# Patient Record
Sex: Male | Born: 1935 | Race: White | Hispanic: No | Marital: Married | Smoking: Former smoker
Health system: Southern US, Community
[De-identification: ages and names within clinical notes are randomized; demographics above are authoritative.]

## PROBLEM LIST (undated history)

## (undated) DIAGNOSIS — I2510 Atherosclerotic heart disease of native coronary artery without angina pectoris: Secondary | ICD-10-CM

## (undated) DIAGNOSIS — N200 Calculus of kidney: Secondary | ICD-10-CM

## (undated) DIAGNOSIS — K219 Gastro-esophageal reflux disease without esophagitis: Secondary | ICD-10-CM

## (undated) DIAGNOSIS — G20 Parkinson's disease: Secondary | ICD-10-CM

## (undated) DIAGNOSIS — I10 Essential (primary) hypertension: Secondary | ICD-10-CM

## (undated) DIAGNOSIS — R7309 Other abnormal glucose: Secondary | ICD-10-CM

## (undated) DIAGNOSIS — E785 Hyperlipidemia, unspecified: Secondary | ICD-10-CM

## (undated) DIAGNOSIS — K635 Polyp of colon: Secondary | ICD-10-CM

## (undated) DIAGNOSIS — C4491 Basal cell carcinoma of skin, unspecified: Secondary | ICD-10-CM

## (undated) DIAGNOSIS — C679 Malignant neoplasm of bladder, unspecified: Secondary | ICD-10-CM

## (undated) HISTORY — PX: BLADDER TUMOR EXCISION: SHX238

## (undated) HISTORY — DX: Malignant neoplasm of bladder, unspecified: C67.9

## (undated) HISTORY — DX: Polyp of colon: K63.5

## (undated) HISTORY — DX: Calculus of kidney: N20.0

## (undated) HISTORY — DX: Atherosclerotic heart disease of native coronary artery without angina pectoris: I25.10

## (undated) HISTORY — DX: Basal cell carcinoma of skin, unspecified: C44.91

## (undated) HISTORY — DX: Hyperlipidemia, unspecified: E78.5

## (undated) HISTORY — DX: Other abnormal glucose: R73.09

## (undated) HISTORY — DX: Gastro-esophageal reflux disease without esophagitis: K21.9

---
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
## 1998-11-08 ENCOUNTER — Observation Stay (HOSPITAL_COMMUNITY): Admission: RE | Admit: 1998-11-08 | Discharge: 1998-11-09 | Payer: Self-pay | Admitting: Internal Medicine

## 1998-11-08 ENCOUNTER — Encounter: Payer: Self-pay | Admitting: Internal Medicine

## 1998-11-09 ENCOUNTER — Encounter: Payer: Self-pay | Admitting: Internal Medicine

## 2000-11-17 HISTORY — PX: LITHOTRIPSY: SUR834

## 2001-11-17 HISTORY — PX: CATARACT EXTRACTION: SUR2

## 2002-08-14 ENCOUNTER — Emergency Department (HOSPITAL_COMMUNITY): Admission: EM | Admit: 2002-08-14 | Discharge: 2002-08-14 | Payer: Self-pay | Admitting: Emergency Medicine

## 2002-08-14 ENCOUNTER — Encounter: Payer: Self-pay | Admitting: Emergency Medicine

## 2002-08-18 ENCOUNTER — Encounter: Payer: Self-pay | Admitting: Urology

## 2002-08-18 ENCOUNTER — Ambulatory Visit (HOSPITAL_BASED_OUTPATIENT_CLINIC_OR_DEPARTMENT_OTHER): Admission: RE | Admit: 2002-08-18 | Discharge: 2002-08-18 | Payer: Self-pay | Admitting: Urology

## 2003-11-18 HISTORY — PX: UPPER GASTROINTESTINAL ENDOSCOPY: SHX188

## 2003-11-18 HISTORY — PX: OTHER SURGICAL HISTORY: SHX169

## 2004-09-03 ENCOUNTER — Inpatient Hospital Stay (HOSPITAL_COMMUNITY): Admission: AD | Admit: 2004-09-03 | Discharge: 2004-09-05 | Payer: Self-pay | Admitting: Internal Medicine

## 2004-10-01 ENCOUNTER — Ambulatory Visit: Payer: Self-pay | Admitting: Internal Medicine

## 2004-10-04 ENCOUNTER — Ambulatory Visit: Payer: Self-pay | Admitting: Cardiology

## 2004-10-06 ENCOUNTER — Emergency Department (HOSPITAL_COMMUNITY): Admission: EM | Admit: 2004-10-06 | Discharge: 2004-10-06 | Payer: Self-pay | Admitting: Emergency Medicine

## 2004-10-08 ENCOUNTER — Ambulatory Visit: Payer: Self-pay | Admitting: Internal Medicine

## 2004-10-18 ENCOUNTER — Ambulatory Visit: Payer: Self-pay | Admitting: Cardiology

## 2004-10-30 ENCOUNTER — Ambulatory Visit: Payer: Self-pay

## 2004-10-30 ENCOUNTER — Ambulatory Visit: Payer: Self-pay | Admitting: Cardiology

## 2004-12-16 ENCOUNTER — Ambulatory Visit: Payer: Self-pay | Admitting: Internal Medicine

## 2005-01-20 ENCOUNTER — Ambulatory Visit: Payer: Self-pay

## 2005-01-30 ENCOUNTER — Ambulatory Visit: Payer: Self-pay | Admitting: Cardiology

## 2005-03-10 ENCOUNTER — Ambulatory Visit: Payer: Self-pay | Admitting: Cardiology

## 2005-05-14 ENCOUNTER — Ambulatory Visit: Payer: Self-pay | Admitting: Internal Medicine

## 2005-05-15 ENCOUNTER — Ambulatory Visit: Payer: Self-pay | Admitting: Internal Medicine

## 2005-05-28 ENCOUNTER — Ambulatory Visit: Payer: Self-pay | Admitting: Cardiology

## 2005-06-02 ENCOUNTER — Ambulatory Visit: Payer: Self-pay | Admitting: Internal Medicine

## 2005-07-15 ENCOUNTER — Ambulatory Visit: Payer: Self-pay | Admitting: Internal Medicine

## 2005-08-19 ENCOUNTER — Ambulatory Visit: Payer: Self-pay | Admitting: Internal Medicine

## 2005-09-18 ENCOUNTER — Ambulatory Visit: Payer: Self-pay | Admitting: Cardiology

## 2005-09-25 ENCOUNTER — Ambulatory Visit: Payer: Self-pay | Admitting: Internal Medicine

## 2005-11-20 ENCOUNTER — Ambulatory Visit: Payer: Self-pay | Admitting: Internal Medicine

## 2006-09-01 ENCOUNTER — Ambulatory Visit: Payer: Self-pay | Admitting: Cardiology

## 2006-09-02 ENCOUNTER — Ambulatory Visit: Payer: Self-pay | Admitting: Internal Medicine

## 2006-09-08 ENCOUNTER — Ambulatory Visit: Payer: Self-pay

## 2006-10-21 ENCOUNTER — Ambulatory Visit: Payer: Self-pay | Admitting: Internal Medicine

## 2006-10-21 LAB — CONVERTED CEMR LAB
AST: 31 units/L (ref 0–37)
BUN: 15 mg/dL (ref 6–23)
Basophils Absolute: 0 10*3/uL (ref 0.0–0.1)
Basophils Relative: 0.7 % (ref 0.0–1.0)
Chol/HDL Ratio, serum: 2.3
Creatinine, Ser: 1.1 mg/dL (ref 0.4–1.5)
Eosinophil percent: 3.3 % (ref 0.0–5.0)
HDL: 73.7 mg/dL (ref >39.0)
Lymphocytes Relative: 27.1 % (ref 12.0–46.0)
Monocytes Relative: 10.7 % (ref 3.0–11.0)
Neutro Abs: 2.7 10*3/uL (ref 1.4–7.7)
Platelets: 190 10*3/uL (ref 150–400)
Potassium: 4 meq/L (ref 3.5–5.1)
RBC: 4.31 M/uL (ref 4.22–5.81)
Triglyceride fasting, serum: 39 mg/dL (ref 0–149)
VLDL: 8 mg/dL (ref 0–40)
WBC: 4.6 10*3/uL (ref 4.5–10.5)

## 2006-11-03 ENCOUNTER — Ambulatory Visit: Payer: Self-pay | Admitting: Internal Medicine

## 2007-01-22 ENCOUNTER — Ambulatory Visit: Payer: Self-pay | Admitting: Internal Medicine

## 2007-03-17 ENCOUNTER — Ambulatory Visit: Payer: Self-pay | Admitting: Internal Medicine

## 2007-06-30 ENCOUNTER — Telehealth (INDEPENDENT_AMBULATORY_CARE_PROVIDER_SITE_OTHER): Payer: Self-pay | Admitting: *Deleted

## 2007-07-22 ENCOUNTER — Ambulatory Visit: Payer: Self-pay | Admitting: Internal Medicine

## 2007-07-22 DIAGNOSIS — I2510 Atherosclerotic heart disease of native coronary artery without angina pectoris: Secondary | ICD-10-CM | POA: Insufficient documentation

## 2007-07-22 DIAGNOSIS — Z87442 Personal history of urinary calculi: Secondary | ICD-10-CM | POA: Insufficient documentation

## 2007-07-22 DIAGNOSIS — K219 Gastro-esophageal reflux disease without esophagitis: Secondary | ICD-10-CM | POA: Insufficient documentation

## 2007-07-22 DIAGNOSIS — M2550 Pain in unspecified joint: Secondary | ICD-10-CM | POA: Insufficient documentation

## 2007-07-23 ENCOUNTER — Encounter (INDEPENDENT_AMBULATORY_CARE_PROVIDER_SITE_OTHER): Payer: Self-pay | Admitting: *Deleted

## 2007-07-23 LAB — CONVERTED CEMR LAB: Rhuematoid fact SerPl-aCnc: <20 intl units/mL — ABNORMAL LOW (ref 0.0–20.0)

## 2007-09-13 ENCOUNTER — Telehealth (INDEPENDENT_AMBULATORY_CARE_PROVIDER_SITE_OTHER): Payer: Self-pay | Admitting: *Deleted

## 2007-09-14 ENCOUNTER — Ambulatory Visit: Payer: Self-pay | Admitting: Internal Medicine

## 2007-09-22 ENCOUNTER — Ambulatory Visit: Payer: Self-pay | Admitting: Cardiology

## 2007-09-27 ENCOUNTER — Telehealth (INDEPENDENT_AMBULATORY_CARE_PROVIDER_SITE_OTHER): Payer: Self-pay | Admitting: *Deleted

## 2007-09-28 ENCOUNTER — Ambulatory Visit: Payer: Self-pay | Admitting: Internal Medicine

## 2007-09-28 DIAGNOSIS — J45909 Unspecified asthma, uncomplicated: Secondary | ICD-10-CM | POA: Insufficient documentation

## 2007-11-01 ENCOUNTER — Encounter (INDEPENDENT_AMBULATORY_CARE_PROVIDER_SITE_OTHER): Payer: Self-pay | Admitting: *Deleted

## 2008-01-28 ENCOUNTER — Encounter (INDEPENDENT_AMBULATORY_CARE_PROVIDER_SITE_OTHER): Payer: Self-pay | Admitting: *Deleted

## 2008-01-31 ENCOUNTER — Ambulatory Visit: Payer: Self-pay | Admitting: Internal Medicine

## 2008-01-31 DIAGNOSIS — N400 Enlarged prostate without lower urinary tract symptoms: Secondary | ICD-10-CM | POA: Insufficient documentation

## 2008-01-31 DIAGNOSIS — E785 Hyperlipidemia, unspecified: Secondary | ICD-10-CM | POA: Insufficient documentation

## 2008-01-31 DIAGNOSIS — N529 Male erectile dysfunction, unspecified: Secondary | ICD-10-CM | POA: Insufficient documentation

## 2008-02-07 ENCOUNTER — Encounter (INDEPENDENT_AMBULATORY_CARE_PROVIDER_SITE_OTHER): Payer: Self-pay | Admitting: *Deleted

## 2008-02-22 ENCOUNTER — Telehealth (INDEPENDENT_AMBULATORY_CARE_PROVIDER_SITE_OTHER): Payer: Self-pay | Admitting: *Deleted

## 2008-03-28 ENCOUNTER — Ambulatory Visit: Payer: Self-pay | Admitting: Internal Medicine

## 2008-03-28 ENCOUNTER — Encounter (INDEPENDENT_AMBULATORY_CARE_PROVIDER_SITE_OTHER): Payer: Self-pay | Admitting: *Deleted

## 2008-03-28 DIAGNOSIS — R972 Elevated prostate specific antigen [PSA]: Secondary | ICD-10-CM | POA: Insufficient documentation

## 2008-03-28 LAB — CONVERTED CEMR LAB
Glucose, Urine, Semiquant: NEGATIVE
Ketones, urine, test strip: NEGATIVE
Nitrite: NEGATIVE
Specific Gravity, Urine: 1.01
pH: 7

## 2008-03-31 ENCOUNTER — Encounter: Payer: Self-pay | Admitting: Internal Medicine

## 2008-05-03 ENCOUNTER — Encounter: Payer: Self-pay | Admitting: Internal Medicine

## 2008-05-31 ENCOUNTER — Encounter: Payer: Self-pay | Admitting: Internal Medicine

## 2008-08-23 ENCOUNTER — Ambulatory Visit: Payer: Self-pay | Admitting: Internal Medicine

## 2008-08-23 ENCOUNTER — Telehealth (INDEPENDENT_AMBULATORY_CARE_PROVIDER_SITE_OTHER): Payer: Self-pay | Admitting: *Deleted

## 2008-09-21 ENCOUNTER — Ambulatory Visit: Payer: Self-pay | Admitting: Internal Medicine

## 2008-10-02 ENCOUNTER — Ambulatory Visit: Payer: Self-pay | Admitting: Cardiology

## 2008-10-10 ENCOUNTER — Ambulatory Visit: Payer: Self-pay | Admitting: Cardiology

## 2009-01-09 ENCOUNTER — Encounter: Payer: Self-pay | Admitting: Internal Medicine

## 2009-01-09 ENCOUNTER — Ambulatory Visit: Payer: Self-pay | Admitting: Internal Medicine

## 2009-02-08 ENCOUNTER — Encounter (INDEPENDENT_AMBULATORY_CARE_PROVIDER_SITE_OTHER): Payer: Self-pay | Admitting: *Deleted

## 2009-02-28 ENCOUNTER — Ambulatory Visit: Payer: Self-pay | Admitting: Internal Medicine

## 2009-02-28 DIAGNOSIS — Z85828 Personal history of other malignant neoplasm of skin: Secondary | ICD-10-CM | POA: Insufficient documentation

## 2009-02-28 DIAGNOSIS — Z860100 Personal history of colon polyps, unspecified: Secondary | ICD-10-CM | POA: Insufficient documentation

## 2009-04-05 ENCOUNTER — Ambulatory Visit: Payer: Self-pay | Admitting: Internal Medicine

## 2009-04-05 LAB — CONVERTED CEMR LAB
AST: 21 units/L (ref 0–37)
BUN: 14 mg/dL (ref 6–23)
Cholesterol: 160 mg/dL (ref 0–200)
Creatinine, Ser: 1 mg/dL (ref 0.4–1.5)
Eosinophils Absolute: 0.1 10*3/uL (ref 0.0–0.7)
GFR calc non Af Amer: 76.76 mL/min (ref >60)
LDL Cholesterol: 80 mg/dL (ref 0–99)
MCHC: 35 g/dL (ref 30.0–36.0)
MCV: 97.1 fL (ref 78.0–100.0)
Monocytes Absolute: 0.4 10*3/uL (ref 0.1–1.0)
Neutrophils Relative %: 50.5 % (ref 43.0–77.0)
Platelets: 155 10*3/uL (ref 150.0–400.0)
Potassium: 4 meq/L (ref 3.5–5.1)
Total Bilirubin: 0.8 mg/dL (ref 0.3–1.2)
Triglycerides: 46 mg/dL (ref 0.0–149.0)
VLDL: 9.2 mg/dL (ref 0.0–40.0)

## 2009-04-10 ENCOUNTER — Ambulatory Visit: Payer: Self-pay | Admitting: Internal Medicine

## 2009-04-10 ENCOUNTER — Telehealth (INDEPENDENT_AMBULATORY_CARE_PROVIDER_SITE_OTHER): Payer: Self-pay | Admitting: *Deleted

## 2009-04-10 DIAGNOSIS — R946 Abnormal results of thyroid function studies: Secondary | ICD-10-CM | POA: Insufficient documentation

## 2009-04-13 ENCOUNTER — Telehealth (INDEPENDENT_AMBULATORY_CARE_PROVIDER_SITE_OTHER): Payer: Self-pay | Admitting: *Deleted

## 2009-04-27 ENCOUNTER — Telehealth (INDEPENDENT_AMBULATORY_CARE_PROVIDER_SITE_OTHER): Payer: Self-pay | Admitting: *Deleted

## 2009-05-07 ENCOUNTER — Emergency Department (HOSPITAL_COMMUNITY): Admission: EM | Admit: 2009-05-07 | Discharge: 2009-05-07 | Payer: Self-pay | Admitting: Emergency Medicine

## 2009-05-08 ENCOUNTER — Ambulatory Visit: Payer: Self-pay | Admitting: Internal Medicine

## 2009-05-29 ENCOUNTER — Ambulatory Visit: Payer: Self-pay | Admitting: Internal Medicine

## 2009-06-02 LAB — CONVERTED CEMR LAB
Free T4: 0.8 ng/dL (ref 0.6–1.6)
TSH: 3.74 microintl units/mL (ref 0.35–5.50)

## 2009-06-04 ENCOUNTER — Encounter (INDEPENDENT_AMBULATORY_CARE_PROVIDER_SITE_OTHER): Payer: Self-pay | Admitting: *Deleted

## 2009-06-06 ENCOUNTER — Ambulatory Visit: Payer: Self-pay | Admitting: Internal Medicine

## 2009-06-06 DIAGNOSIS — R319 Hematuria, unspecified: Secondary | ICD-10-CM | POA: Insufficient documentation

## 2009-06-11 ENCOUNTER — Telehealth (INDEPENDENT_AMBULATORY_CARE_PROVIDER_SITE_OTHER): Payer: Self-pay | Admitting: *Deleted

## 2009-09-18 ENCOUNTER — Encounter: Payer: Self-pay | Admitting: Internal Medicine

## 2009-10-07 DIAGNOSIS — I10 Essential (primary) hypertension: Secondary | ICD-10-CM | POA: Insufficient documentation

## 2009-10-09 ENCOUNTER — Ambulatory Visit: Payer: Self-pay | Admitting: Cardiology

## 2009-10-10 ENCOUNTER — Encounter: Payer: Self-pay | Admitting: Internal Medicine

## 2010-02-12 ENCOUNTER — Ambulatory Visit: Payer: Self-pay | Admitting: Internal Medicine

## 2010-03-07 ENCOUNTER — Ambulatory Visit: Payer: Self-pay | Admitting: Internal Medicine

## 2010-03-07 DIAGNOSIS — R252 Cramp and spasm: Secondary | ICD-10-CM | POA: Insufficient documentation

## 2010-03-13 ENCOUNTER — Ambulatory Visit: Payer: Self-pay | Admitting: Internal Medicine

## 2010-03-13 LAB — CONVERTED CEMR LAB
Ketones, urine, test strip: NEGATIVE
Nitrite: NEGATIVE
Urobilinogen, UA: 0.2

## 2010-03-14 ENCOUNTER — Encounter: Payer: Self-pay | Admitting: Internal Medicine

## 2010-03-14 LAB — CONVERTED CEMR LAB
AST: 23 units/L (ref 0–37)
Albumin: 4 g/dL (ref 3.5–5.2)
BUN: 12 mg/dL (ref 6–23)
Basophils Relative: 0.4 % (ref 0.0–3.0)
Cholesterol: 159 mg/dL (ref 0–200)
Eosinophils Relative: 2.8 % (ref 0.0–5.0)
GFR calc non Af Amer: 68.6 mL/min (ref >60)
Glucose, Bld: 87 mg/dL (ref 70–99)
HCT: 41.5 % (ref 39.0–52.0)
Hemoglobin: 14.2 g/dL (ref 13.0–17.0)
Lymphs Abs: 1.5 10*3/uL (ref 0.7–4.0)
MCV: 97.4 fL (ref 78.0–100.0)
Monocytes Absolute: 0.4 10*3/uL (ref 0.1–1.0)
Neutro Abs: 2.4 10*3/uL (ref 1.4–7.7)
Platelets: 163 10*3/uL (ref 150.0–400.0)
Potassium: 4.7 meq/L (ref 3.5–5.1)
RBC: 4.26 M/uL (ref 4.22–5.81)
Total Protein: 6.8 g/dL (ref 6.0–8.3)
VLDL: 10.8 mg/dL (ref 0.0–40.0)
WBC: 4.5 10*3/uL (ref 4.5–10.5)

## 2010-03-15 ENCOUNTER — Telehealth: Payer: Self-pay | Admitting: Internal Medicine

## 2010-03-21 ENCOUNTER — Encounter: Payer: Self-pay | Admitting: Internal Medicine

## 2010-03-22 ENCOUNTER — Telehealth: Payer: Self-pay | Admitting: Internal Medicine

## 2010-04-17 ENCOUNTER — Encounter: Payer: Self-pay | Admitting: Internal Medicine

## 2010-04-29 ENCOUNTER — Telehealth: Payer: Self-pay | Admitting: Cardiology

## 2010-05-01 ENCOUNTER — Telehealth: Payer: Self-pay | Admitting: Cardiology

## 2010-05-03 ENCOUNTER — Ambulatory Visit: Payer: Self-pay | Admitting: Cardiology

## 2010-05-06 ENCOUNTER — Telehealth (INDEPENDENT_AMBULATORY_CARE_PROVIDER_SITE_OTHER): Payer: Self-pay | Admitting: *Deleted

## 2010-05-07 ENCOUNTER — Ambulatory Visit: Payer: Self-pay

## 2010-05-07 ENCOUNTER — Telehealth: Payer: Self-pay | Admitting: Cardiology

## 2010-05-07 ENCOUNTER — Encounter (HOSPITAL_COMMUNITY): Admission: RE | Admit: 2010-05-07 | Discharge: 2010-07-24 | Payer: Self-pay | Admitting: Cardiology

## 2010-05-07 ENCOUNTER — Ambulatory Visit: Payer: Self-pay | Admitting: Internal Medicine

## 2010-05-07 ENCOUNTER — Encounter: Payer: Self-pay | Admitting: Internal Medicine

## 2010-05-29 ENCOUNTER — Encounter: Payer: Self-pay | Admitting: Internal Medicine

## 2010-08-14 ENCOUNTER — Ambulatory Visit: Payer: Self-pay | Admitting: Internal Medicine

## 2010-08-21 ENCOUNTER — Ambulatory Visit: Payer: Self-pay | Admitting: Internal Medicine

## 2010-08-29 ENCOUNTER — Encounter: Payer: Self-pay | Admitting: Internal Medicine

## 2010-10-04 ENCOUNTER — Ambulatory Visit: Payer: Self-pay | Admitting: Internal Medicine

## 2010-10-04 DIAGNOSIS — IMO0002 Reserved for concepts with insufficient information to code with codable children: Secondary | ICD-10-CM | POA: Insufficient documentation

## 2010-10-07 ENCOUNTER — Telehealth (INDEPENDENT_AMBULATORY_CARE_PROVIDER_SITE_OTHER): Payer: Self-pay | Admitting: *Deleted

## 2010-10-31 ENCOUNTER — Encounter: Payer: Self-pay | Admitting: Cardiology

## 2010-11-01 ENCOUNTER — Encounter: Payer: Self-pay | Admitting: Cardiology

## 2010-11-01 ENCOUNTER — Ambulatory Visit: Payer: Self-pay | Admitting: Cardiology

## 2010-11-17 DIAGNOSIS — R7309 Other abnormal glucose: Secondary | ICD-10-CM

## 2010-11-17 HISTORY — DX: Other abnormal glucose: R73.09

## 2010-12-15 LAB — CONVERTED CEMR LAB
Albumin: 3.9 g/dL (ref 3.5–5.2)
Basophils Absolute: 0 10*3/uL (ref 0.0–0.1)
Cholesterol, target level: 200 mg/dL
Cholesterol: 146 mg/dL (ref 0–200)
Creatinine, Ser: 1 mg/dL (ref 0.4–1.5)
Eosinophils Absolute: 0.2 10*3/uL (ref 0.0–0.6)
GFR calc Af Amer: 93 mL/min
GFR calc non Af Amer: 77 mL/min
HCT: 39.5 % (ref 39.0–52.0)
HDL goal, serum: 40 mg/dL
HDL: 64.9 mg/dL (ref >39.0)
Hemoglobin: 13.2 g/dL (ref 13.0–17.0)
Lymphocytes Relative: 30.9 % (ref 12.0–46.0)
MCHC: 33.3 g/dL (ref 30.0–36.0)
MCV: 95.9 fL (ref 78.0–100.0)
Monocytes Absolute: 0.6 10*3/uL (ref 0.2–0.7)
Monocytes Relative: 12.4 % — ABNORMAL HIGH (ref 3.0–11.0)
Neutro Abs: 2.4 10*3/uL (ref 1.4–7.7)
Neutrophils Relative %: 51.6 % (ref 43.0–77.0)
PSA: 3.67 ng/mL (ref 0.10–4.00)
Potassium: 5.1 meq/L (ref 3.5–5.1)
Sodium: 147 meq/L — ABNORMAL HIGH (ref 135–145)
TSH: 4.38 microintl units/mL (ref 0.35–5.50)
Total Bilirubin: 0.9 mg/dL (ref 0.3–1.2)

## 2010-12-17 NOTE — Assessment & Plan Note (Signed)
Summary: CPX/KDC   Vital Signs:  Patient profile:   77 year old male Height:      67 inches Weight:      152 pounds Temp:     98.1 degrees F oral Pulse rate:   62 / minute Resp:     16 per minute BP sitting:   138 / 62  (left arm)  Vitals Entered By: Jeremy Johann CMA (March 07, 2010 1:09 PM)  CC: cpx, not fasting, General Medical Evaluation Comments REVIEWED MED LIST, PATIENT AGREED DOSE AND INSTRUCTION CORRECT    Primary Care Provider:  Marga Melnick MD  CC:  cpx, not fasting, and General Medical Evaluation.  History of Present Illness: [name] [name] is here for a physical; he is essentially except for intermittent cramps in calf, especially @ night. Preventive measures reviewed; all up to date  including immunizations & POA.  Allergies: 1)  ! * Dilaudid 2)  ! Zocor 3)  ! * Regular Asa  Past History:  Past Medical History: GERD Nephrolithiasis, PMH of X? 5, Dr Annabell Howells Asthma ; 2001 intestinal infection ; PNA 2001 C A D (ICD-414.00) Colonic polyps, PMH  of Skin cancer, PMH of, basal cell X 2, Dr Londell Moh, Derm Hematuria ,recurrent , negative evaluation Dr Annabell Howells 2009 Coronary heart disease, status post overlapping drug-eluting stents     in the proximal left anterior descending and a drug-eluting stent     in distal left anterior descending 2005, now stable.  Good left ventricular function. Hyperlipidemia. Elevated  liver function tests on Zocor.  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 Eyes:  Denies blurring, double vision, and vision loss-both eyes. ENT:  Complains of decreased hearing; denies ringing in ears. CV:  Denies chest pain or discomfort, difficulty breathing at night, difficulty breathing while lying down, leg cramps with exertion, palpitations, shortness of breath with exertion, swelling of feet, and swelling of hands. Resp:  Denies cough, excessive snoring, hypersomnolence, morning headaches, and sputum productive. GI:  Denies abdominal pain, bloody stools, dark tarry stools, and indigestion. GU:  Complains of hematuria; denies discharge and dysuria; Intermittent hematuria. MS:  Complains of joint pain and cramps; denies joint redness, joint swelling, low back pain, mid back pain, muscle aches, and thoracic pain. Derm:  Denies changes in nail beds, dryness, hair loss, and lesion(s). Neuro:  Complains of numbness and tingling; denies disturbances in coordination and poor balance; N&T in C8 distribution  bilaterally & R lateral foot. Psych:  Denies anxiety and depression. Endo:  Denies cold intolerance, excessive hunger, excessive thirst, excessive urination, and heat intolerance. Heme:  Plavix affects clotting. Allergy:  Complains of itching eyes, seasonal allergies, and sneezing; Fexofenidine & fluticasone control symptoms.  Physical Exam  General:  Appears younger than age,well-nourished, alert,appropriate and cooperative throughout examination Head:  Normocephalic and atraumatic without obvious abnormalities. Eyes:  No corneal or conjunctival inflammation noted.Perrla. Funduscopic exam benign, without hemorrhages, exudates or papilledema.  Ears:  External ear exam shows no significant lesions or deformities.  Otoscopic examination reveals clear canals, tympanic membranes are intact  bilaterally without bulging, retraction, inflammation or discharge. Hearing is grossly normal bilaterally. Nose:  External nasal examination shows no deformity or inflammation. Nasal mucosa are pink and moist without lesions or exudates. Mouth:  Oral mucosa and oropharynx without lesions or exudates. Upper plate Neck:  No deformities, masses, or tenderness noted. Chest Wall:  Prominent xiphoid Lungs:  Normal respiratory effort, chest expands symmetrically. Lungs are clear to auscultation, no crackles or wheezes. Heart:  Normal rate and regular rhythm. S1 and S2 normal without gallop, murmur, click, rub.S4 Abdomen:  Bowel sounds positive,abdomen soft and non-tender without masses, organomegaly or hernias noted. Rectal:  No external abnormalities noted. Normal sphincter tone. No rectal masses or tenderness. Genitalia:  Testes bilaterally descended without nodularity, tenderness or masses. No scrotal masses or lesions. No penis lesions or urethral discharge. Prostate:  Prostate gland firm and smooth, ULN enlargement, w/o nodularity, tenderness, mass, asymmetry or induration. Msk:  No deformity or scoliosis noted of thoracic or lumbar spine.   Pulses:  R and L carotid,radial,dorsalis pedis and posterior tibial pulses are full and equal bilaterally Extremities:  No clubbing, cyanosis, or deformity noted with normal full range of motion of all joints.  trace left pedal edema and trace right pedal edema @ sox line.   Neurologic:  alert & oriented X3 and DTRs symmetrical and normal.   Skin:  Intact without suspicious lesions or rashes Cervical Nodes:  No lymphadenopathy noted Axillary Nodes:  No palpable lymphadenopathy Inguinal Nodes:  No significant adenopathy Psych:  memory intact for recent and remote, normally interactive, and good eye contact.     Impression & Recommendations:  Problem # 1:  PREVENTIVE HEALTH CARE (ICD-V70.0)  Problem # 2:  MUSCLE CRAMPS (ICD-729.82)  Problem # 3:   HYPERTENSION, BENIGN (ICD-401.1) controlled  Problem # 4:  HYPERPLASIA PROSTATE UNS W/O UR OBST & OTH LUTS (ICD-600.90) essentially resolved  Problem # 5:  ASTHMA (ICD-493.90) quiescent His updated medication list for this problem includes:    Advair Diskus 100-50 Mcg/dose Misc (Fluticasone-salmeterol) .Marland Kitchen... 1 puff q 12 hrs  Problem # 6:  C A D (ICD-414.00) as per Dr Juanda Chance His updated medication list for this problem includes:    Plavix 75 Mg Tabs (Clopidogrel bisulfate) .Marland Kitchen... 1 by mouth qd    Adult Aspirin Ec Low Strength 81 Mg Tbec (Aspirin) .Marland Kitchen... 1 by mouth qd  Problem # 7:  HEMATURIA UNSPECIFIED (ICD-599.70) Recurrent The following medications were removed from the medication list:    Amoxicillin-pot Clavulanate 875-125 Mg Tabs (Amoxicillin-pot clavulanate) .Marland Kitchen... 1 q 12 hrs with a meal  Complete Medication List: 1)  Crestor 10 Mg Tabs (Rosuvastatin calcium) .... 1/2 tab mon,wed,fri 2)  Flonase 50 Mcg/act Susp (Fluticasone propionate) .Marland Kitchen.. 1 spray two times a [name] as "crossover" technique 3)  Omeprazole 20 Mg Cpdr (Omeprazole) .Marland Kitchen.. 1 by mouth qd 4)  Plavix 75 Mg Tabs (Clopidogrel bisulfate) .Marland Kitchen.. 1 by mouth qd 5)  Adult Aspirin Ec Low Strength 81 Mg Tbec (Aspirin) .Marland Kitchen.. 1 by mouth qd 6)  Advair Diskus 100-50 Mcg/dose Misc (Fluticasone-salmeterol) .Marland Kitchen.. 1 puff q 12 hrs 7)  Viagra 100 Mg Tabs (Sildenafil citrate) .... 1/2-1 once daily prn 8)  Allegra 180 Mg Tabs (Fexofenadine hcl) .Marland Kitchen.. 1 by mouth once daily  Patient Instructions: 1)  Please schedule fasting labs: 2)  BMP ;vitamin D level; 3)  Hepatic Panel; 4)  Lipid Panel ;  5)  TSH ; 6)  CBC w/ Diff; 7)  PSA ; urinalysis with micro. Codes: 729.82, 414.00,599.70,401.1,600.90. Take Mag /Cal (or Cal/Mag) at bedtime as needed  for muscle aches.

## 2010-12-17 NOTE — Assessment & Plan Note (Signed)
Summary: hip pain/fell last week/cbs   Vital Signs:  Patient profile:   77 year old male Height:      67 inches (170.18 cm) Weight:      153.50 pounds (69.77 kg) BMI:     24.13 Temp:     97.9 degrees F (36.61 degrees C) oral Resp:     16 per minute BP sitting:   150 / 70  (left arm) Cuff size:   regular  Vitals Entered By: Lucious Groves CMA (October 04, 2010 12:30 PM) CC: C/O hip and lower back pain x1 week./kb, Lower Extremity Joint pain Is Patient Diabetic? No Pain Assessment Patient in pain? yes     Location: hip/back Intensity: 2 Type: sharp Onset of pain  one week Comments Patient notes that he fell last week going down his steps. He also notes being hit on that same side with a tennis ball. Patient notes that he normally has an ache, but the sharp pain is somewhat new./kb   Primary Care Franci Oshana:  Marga Melnick MD  CC:  C/O hip and lower back pain x1 week./kb and Lower Extremity Joint pain.  History of Present Illness: Lower Extremity Joint Pain      This is a 77 year old man who presents with Lower Extremity Joint pain X 3 weeks.  The patient reports locking, but denies swelling, redness, giving away, popping, stiffness for >1 hr, decreased ROM, and weakness.  The pain is located in the right hip.  The pain began suddenly and with a fall on stairs 3 weeks ago. There was bruising which has resolved. The pain  had been  dull, intermittent, and  relieved by activity  until this am In fact he has been playing golf & tennis w/o limitation.Last night he exercised aggressively @ tennis drills.This am he had very sharp pain with straightening up this am.  The patient denies the following symptoms: fever, rash, photosensitivity, eye symptoms, diarrhea, and dysuria.  Rx:ice. He is on Plavix. PMH of DJD of hips; Glucosamne / Chrondroitin helped .No PMH of Osteopenia.  Current Medications (verified): 1)  Crestor 10 Mg Tabs (Rosuvastatin Calcium) .... 1/2 Tab Mon,wed,fri 2)  Flonase  50 Mcg/act Susp (Fluticasone Propionate) .Marland Kitchen.. 1 Spray  One  Time A Day As "crossover" Technique 3)  Omeprazole 20 Mg  Cpdr (Omeprazole) .Marland Kitchen.. 1 By Mouth Qd 4)  Plavix 75 Mg  Tabs (Clopidogrel Bisulfate) .Marland Kitchen.. 1 By Mouth Qd 5)  Adult Aspirin Ec Low Strength 81 Mg  Tbec (Aspirin) .Marland Kitchen.. 1 By Mouth Qd 6)  Advair Diskus 100-50 Mcg/dose  Misc (Fluticasone-Salmeterol) .Marland Kitchen.. 1 Puff Q 12 Hrs 7)  Viagra 100 Mg  Tabs (Sildenafil Citrate) .... 1/2-1 Once Daily Prn 8)  Allegra 180 Mg  Tabs (Fexofenadine Hcl) .Marland Kitchen.. 1 By Mouth Once Daily 9)  Mvi .... As Directed 10)  Glucosamine/chondroitin .... As Directed 11)  Rapaflo 4 Mg Caps (Silodosin) .Marland Kitchen.. 1 By Mouth At Bedtime  Allergies (verified): 1)  ! * Dilaudid 2)  ! Zocor 3)  ! * Regular Asa  Review of Systems Neuro:  Denies brief paralysis, disturbances in coordination, numbness, poor balance, tingling, and weakness.  Physical Exam  General:  in no acute distress; alert,appropriate and cooperative throughout examination Msk:  He lay down & sat up w/o help Extremities:  No clubbing, cyanosis, edema. Mild OA DIP changes.Neg SLR. Pain @ the R sacral area, NOT the hip , with ROM Neurologic:  alert & oriented X3, strength normal in all extremities,  gait (heel & toe) normal, and DTRs symmetrical and normal.   Skin:  Intact without suspicious lesions or rashes Psych:  memory intact for recent and remote, normally interactive, and good eye contact.     Impression & Recommendations:  Problem # 1:  OTHER INJURY OF OTHER SITES OF TRUNK (ICD-959.19)  R sacroiliac area ; on Plavix  Orders: T-Sacroiliac Joints (43329JJ) T-Hip Comp Right Min 2 views (73510TC) Prescription Created Electronically [id number])  Complete Medication List: 1)  Crestor 10 Mg Tabs (Rosuvastatin calcium) .... 1/2 tab mon,wed,fri 2)  Flonase 50 Mcg/act Susp (Fluticasone propionate) .Marland Kitchen.. 1 spray  one  time a day as "crossover" technique 3)  Omeprazole 20 Mg Cpdr (Omeprazole) .Marland Kitchen.. 1 by mouth  qd 4)  Plavix 75 Mg Tabs (Clopidogrel bisulfate) .Marland Kitchen.. 1 by mouth qd 5)  Adult Aspirin Ec Low Strength 81 Mg Tbec (Aspirin) .Marland Kitchen.. 1 by mouth qd 6)  Advair Diskus 100-50 Mcg/dose Misc (Fluticasone-salmeterol) .Marland Kitchen.. 1 puff q 12 hrs 7)  Viagra 100 Mg Tabs (Sildenafil citrate) .... 1/2-1 once daily prn 8)  Allegra 180 Mg Tabs (Fexofenadine hcl) .Marland Kitchen.. 1 by mouth once daily 9)  Mvi  .... As directed 10)  Glucosamine/chondroitin  .... As directed 11)  Rapaflo 4 Mg Caps (Silodosin) .Marland Kitchen.. 1 by mouth at bedtime 12)  Tramadol Hcl 50 Mg Tabs (Tramadol hcl) .Marland Kitchen.. 1 every 6 hrs as needed pain  Patient Instructions: 1)  Xrays @ [facility]. Prescriptions: TRAMADOL HCL 50 MG TABS (TRAMADOL HCL) 1 every 6 hrs as needed pain  #30 x 0   Entered and Authorized by:   Marga Melnick MD   Signed by:   Marga Melnick MD on 10/04/2010   Method used:   Faxed to ...       [facility] [address] (retail)       [address]       [place], [place]  [address]       Ph: [phone]       Fax: [phone]   RxID:   [id number]    Orders Added: 1)  T-Sacroiliac Joints [72202TC] 2)  T-Hip Comp Right Min 2 views [73510TC] 3)  Est. Patient Level IV [60737] 4)  Prescription Created Electronically [phone]

## 2010-12-17 NOTE — Letter (Signed)
Summary: [facility]  [facility]   Imported By: Lanelle Bal 09/07/2010 10:21:52  _____________________________________________________________________  External Attachment:    Type:   Image     Comment:   External Document

## 2010-12-17 NOTE — Consult Note (Signed)
Summary: [facility]  [facility]   Imported By: Lanelle Bal 04/25/2010 13:01:40  _____________________________________________________________________  External Attachment:    Type:   Image     Comment:   External Document

## 2010-12-17 NOTE — Assessment & Plan Note (Signed)
Summary: Cardiology Nuclear Study  Nuclear Med Background Indications for Stress Test: Evaluation for Ischemia, Surgical Clearance, Stent Patency   History: Asthma, Myocardial Perfusion Study, Stents  History Comments: '05 Stents: x3; LAD '07 MPS: EF= 60%, NL     Nuclear Pre-Procedure Cardiac Risk Factors: History of Smoking, Hypertension, Lipids Caffeine/Decaff Intake: none NPO After: 8:30 PM IV 0.9% NS with Angio Cath: 20g     IV Site: (R) AC IV Started by: Stanton Kidney EMT-P Chest Size (in) 40     Height (in): 67 Weight (lb): 153 BMI: 24.05  Nuclear Med Study 1 or 2 day study:  1 day     Stress Test Type:  Stress Reading MD:  Arvilla Meres, MD     Referring MD:  Charlies Constable Resting Radionuclide:  Technetium 51m Tetrofosmin     Resting Radionuclide Dose:  10 mCi  Stress Radionuclide:  Technetium 66m Tetrofosmin     Stress Radionuclide Dose:  33 mCi   Stress Protocol Exercise Time (min):  9:00 min     Max HR:  133 bpm     Predicted Max HR:  141 bpm  Max Systolic BP: 175 mm Hg     Percent Max HR:  94.33 %     METS: 10.10 Rate Pressure Product:  24401    Stress Test Technologist:  Milana Na EMT-P     Nuclear Technologist:  Domenic Polite CNMT  Rest Procedure  Myocardial perfusion imaging was performed at rest 45 minutes following the intravenous administration of Myoview Technetium 42m Tetrofosmin.  Stress Procedure  The patient exercised for 9:00. The patient stopped due to fatigue, sob, and denied any chest pain.  There were + significant ST-T wave changes and occ pvcs/pacs.  Myoview was injected at peak exercise and myocardial perfusion imaging was performed after a brief delay.  QPS Raw Data Images:  Normal; no motion artifact; normal heart/lung ratio. Stress Images:  Thinning of inferior, inferoseptal and inferoapical walls. Rest Images:  Thinning of inferior, inferoseptal and inferoapical walls. Subtraction (SDS):  Fixed Thinning of inferior,  inferoseptal and inferoapical walls. No ischemia. Transient Ischemic Dilatation:  .98  (Normal <1.22)  Lung/Heart Ratio:  .36  (Normal <0.45)  Quantitative Gated Spect Images QGS EDV:  103 ml QGS ESV:  41 ml QGS EF:  60 % QGS cine images:  Normal  Findings Low risk nuclear study      Overall Impression  Exercise Capacity: Good exercise capacity. BP Response: Normal blood pressure response. Clinical Symptoms: There is dyspnea. ECG Impression: No significant ST segment change suggestive of ischemia. Overall Impression: Low risk stress nuclear study. Overall Impression Comments: Mild fixed thinning of inferior, inferoseptal and inferoapical walls. No ischemia.  Appended Document: Cardiology Nuclear Study I reviewed the scan and discussed with DB, TS and DM.  The scan has changed a little since 2007 but the defect appears fixed and is a low risk scan.  Think OK for urologic surgery next week off Plavix and on ASA.  Resume plavix after surgery.  F/U with me 6 mos.  Please fax surgical clearance to Dr Georgette Shell in [facility]. BB  Appended Document: Cardiology Nuclear Study I called and spoke with Jasmine December at Dr. Shearon Balo office. The pt has been r/s for surgery on 6/30. He is aware that he is ok to hold plavix for surgery. I have called and clarified with the pt's wife that the pt does not need his appt on 6/28 with Dr. Juanda Chance. We will f/u with  him in December 2011. She verbalizes understanding.

## 2010-12-17 NOTE — Progress Notes (Signed)
   Phone Note Call from Patient   Summary of Call: Patient was in office.  This was not a phone call.  DOD review of nuclear different from 2007 report.  Denied chest pain. Tolerated well.  Apical defect is new from 2007 report  (scan not available).  Recommended renewing plavix, seeing Dr. Juanda Chance next week, and then deffering urologic procedure for now until seen.  Pt agreeable.   Initial call taken by: Ronaldo Miyamoto, MD, [facility],  May 07, 2010 12:24 PM

## 2010-12-17 NOTE — Assessment & Plan Note (Signed)
Summary: SHINGLES VACCINE/RH......   Nurse Visit   Allergies: 1)  ! * Dilaudid 2)  ! Zocor 3)  ! * Regular Asa  Immunizations Administered:  Zostavax # 1:    Vaccine Type: Zostavax    Site: Left Arm    Mfr: Merck    Dose:    Route: [place]    Given by: Shonna Chock CMA    Exp. Date: 07/05/2011    Lot #: [id number]    VIS given: 08/29/05 given August 21, 2010.  Orders Added: 1)  Zoster (Shingles) Vaccine Live [90736] 2)  Admin 1st Vaccine [phone]

## 2010-12-17 NOTE — Miscellaneous (Signed)
Summary: Orders Update  Clinical Lists Changes  Orders: Added new Referral order of Urology Referral (Urology) - Signed 

## 2010-12-17 NOTE — Progress Notes (Signed)
Summary: surgical clearance   Phone Note From Other Clinic   Caller: nurse Erskine Squibb Summary of Call: Per Erskine Squibb pt needs clearance for surgery on the 23rd. Does pt need to be seen first?? Phone note sent last week but unclear if pt needs to be seen. ofc [id number] fax [phone]  Initial call taken by: Edman Circle,  May 01, 2010 9:40 AM  Follow-up for Phone Call        [facility]--cystoscopy and bilateral retrograde pylogram,  TURBP installation of chemo into bladder.  This surgery is scheduled on 05/09/10 with Dr Lindley Magnus.  The pt's ASA and PLAVIX need to be held 5-7 days prior to procedure.  The pt needs to be evaluated by Dr Juanda Chance.  I have arranged an appt on 05/03/10.  I left a message for the pt to callback about appt.  Follow-up by: Julieta Gutting, RN, BSN,  May 01, 2010 9:56 AM  Additional Follow-up for Phone Call Additional follow up Details #1::        returning call, [phone], Migdalia Dk  May 01, 2010 4:33 PM   I spoke with the pt and made him aware of appt on 05/03/10. Additional Follow-up by: Julieta Gutting, RN, BSN,  May 01, 2010 4:50 PM

## 2010-12-17 NOTE — Progress Notes (Signed)
Summary: Nuclear Pre-Procedure  Phone Note Outgoing Call Call back at [facility] Phone [phone]   Call placed by: Stanton Kidney, EMT-P,  May 06, 2010 4:21 PM Action Taken: Phone Call Completed Summary of Call: Left message with information on Myoview Information Sheet (see scanned document for details).     Nuclear Med Background Indications for Stress Test: Evaluation for Ischemia, Surgical Clearance, Stent Patency   History: Asthma, Myocardial Perfusion Study, Stents  History Comments: '05 Stents: x3; LAD '07 MPS: EF= 60%, NL     Nuclear Pre-Procedure Cardiac Risk Factors: History of Smoking, Hypertension, Lipids Height (in): 67

## 2010-12-17 NOTE — Letter (Signed)
Summary: [facility]  [facility]   Imported By: Lennie Odor 06/12/2010 14:26:09  _____________________________________________________________________  External Attachment:    Type:   Image     Comment:   External Document

## 2010-12-17 NOTE — Progress Notes (Signed)
Summary: surgical clearance   Phone Note From Other Clinic   Caller: nurse Erskine Squibb Summary of Call: Per Erskine Squibb from Dr Lindley Magnus ofc. pt needs clearance for procedure. needs to stop plavix 5-7 days. needs cardiac clearance also for bladder tumor surgery. ofc [id number] x [phone] fax [phone] Initial call taken by: Edman Circle,  April 29, 2010 1:27 PM  Follow-up for Phone Call        04/29/10--1530--dr eskew office calling for surgical clearance for mr [name] [name]--surgery scheduled for 05/09/10--please advise if pt needs a o.v. before surgery--thanks nt Follow-up by: Ledon Snare, RN,  April 29, 2010 3:27 PM

## 2010-12-17 NOTE — Assessment & Plan Note (Signed)
Summary: FLU SHOT,HOPPER PT/RH......   Nurse Visit   Allergies: 1)  ! * Dilaudid 2)  ! Zocor 3)  ! * Regular Asa  Orders Added: 1)  Flu Vaccine 81yrs + MEDICARE PATIENTS [Q2039] 2)  Administration Flu vaccine - MCR [G0008] Flu Vaccine Consent Questions     Do you have a history of severe allergic reactions to this vaccine? no    Any prior history of allergic reactions to egg and/or gelatin? no    Do you have a sensitivity to the preservative Thimersol? no    Do you have a past history of Guillan-Barre Syndrome? no    Do you currently have an acute febrile illness? no    Have you ever had a severe reaction to latex? no    Vaccine information given and explained to patient? yes    Are you currently pregnant? no    Lot Number:[id number]   Exp Date:05/17/2011   Manufacturer: [employer]    Site Given  Left Deltoid IM

## 2010-12-17 NOTE — Progress Notes (Signed)
Summary: UROLOGY REFERRAL  Phone Note Call from Patient Call back at Work Phone [phone]   Caller: Patient Summary of Call: PATIENT CALLING I INFORMED HIM OF HIS UROLOGY APPT IN [place] AS HE REQUESTED.  PATIENT STATES HE DIDN'T JUST WANT TO BE REFERRED TO ANY UROLOGIST IN [place], AND WOULD LIKE DR. Camani Sesay'S OPINION ON THE FOLLOWING 2 UROLOGISTS IF HE KNOWS THEM......DR. PAUL COUGHLIN, AND DR. ANDY ESKEW. Initial call taken by: Magdalen Spatz [facility],  Mar 22, 2010 9:39 AM  Follow-up for Phone Call        eitherare excellent Follow-up by: Marga Melnick MD,  Mar 22, 2010 1:18 PM  Additional Follow-up for Phone Call Additional follow up Details #1::        PATIENT NOW HAS AN APPT W/DR ESKEW & PATIENT IS SATISFIED & AWARE. Additional Follow-up by: Magdalen Spatz [facility],  Mar 25, 2010 4:47 PM

## 2010-12-17 NOTE — Assessment & Plan Note (Signed)
Summary: surgical clearance/lwb  Medications Added FLONASE 50 MCG/ACT SUSP (FLUTICASONE PROPIONATE) 1 spray  one  time a day as "crossover" technique      Allergies Added:   Visit Type:  Pre-op Evaluation Primary Provider:  Marga Melnick MD  CC:  no complaints.  History of Present Illness: Patient is 77 years old and comes in for a preoperative evaluation prior to urologic surgery. He was found to have hematuria and has been found to have some small bladder tumors which need to be removed and this is scheduled for Thursday next week.  In 2005 he had unstable angina and had 2 overlapping Cypher stents in the proximal LAD and one Cypher stent in the distal LAD. He's done quite well since that time has been active playing tennis and other activities without chest pain. However he did not have very many symptoms prior to his stent procedure in 2005.  His other problems include hypertension, and hyperlipidemia. His cholesterol panel in April of this year showed a total cholesterol of 159, an HDL of 70, and LDL of 78.  Current Medications (verified): 1)  Crestor 10 Mg Tabs (Rosuvastatin Calcium) .... 1/2 Tab Mon,wed,fri 2)  Flonase 50 Mcg/act Susp (Fluticasone Propionate) .Marland Kitchen.. 1 Spray  One  Time A Day As "crossover" Technique 3)  Omeprazole 20 Mg  Cpdr (Omeprazole) .Marland Kitchen.. 1 By Mouth Qd 4)  Plavix 75 Mg  Tabs (Clopidogrel Bisulfate) .Marland Kitchen.. 1 By Mouth Qd 5)  Adult Aspirin Ec Low Strength 81 Mg  Tbec (Aspirin) .Marland Kitchen.. 1 By Mouth Qd 6)  Advair Diskus 100-50 Mcg/dose  Misc (Fluticasone-Salmeterol) .Marland Kitchen.. 1 Puff Q 12 Hrs 7)  Viagra 100 Mg  Tabs (Sildenafil Citrate) .... 1/2-1 Once Daily Prn 8)  Allegra 180 Mg  Tabs (Fexofenadine Hcl) .Marland Kitchen.. 1 By Mouth Once Daily  Allergies (verified): 1)  ! * Dilaudid 2)  ! Zocor 3)  ! * Regular Asa  Past History:  Past Medical History: Reviewed history from 03/07/2010 and no changes required. GERD Nephrolithiasis, PMH of X? 5, Dr Annabell Howells Asthma ; 2001 intestinal  infection ; PNA 2001 C A D (ICD-414.00) Colonic polyps, PMH  of Skin cancer, PMH of, basal cell X 2, Dr Londell Moh, Derm Hematuria ,recurrent , negative evaluation Dr Annabell Howells 2009 Coronary heart disease, status post overlapping drug-eluting stents     in the proximal left anterior descending and a drug-eluting stent     in distal left anterior descending 2005, now stable.  Good left ventricular function. Hyperlipidemia. Elevated  liver function tests on Zocor.  Review of Systems       ROS is negative except as outlined in HPI.   Vital Signs:  Patient profile:   76 year old male Height:      67 inches Weight:      152 pounds Pulse rate:   56 / minute BP sitting:   147 / 62  (left arm) Cuff size:   regular  Vitals Entered By: Burnett Kanaris, CNA (May 03, 2010 11:45 AM)  Physical Exam  Additional Exam:  Gen. Well-nourished, in no distress   Neck: No JVD, thyroid not enlarged, no carotid bruits Lungs: No tachypnea, clear without rales, rhonchi or wheezes Cardiovascular: Rhythm regular, PMI not displaced,  heart sounds  normal, no murmurs or gallops, no peripheral edema, pulses normal in all 4 extremities. Abdomen: BS normal, abdomen soft and non-tender without masses or organomegaly, no hepatosplenomegaly. MS: No deformities, no cyanosis or clubbing   Neuro:  No focal sns  Skin:  no lesions    Impression & Recommendations:  Problem # 1:  PREOPERATIVE EXAMINATION (ICD-V72.84) The patient is here for preoperative evaluation prior to bladder surgery. He had drug eluding stents in the LAD in 2005. He's been active and has not had many symptoms but he did not have any symptoms prior to his stent procedure. We will plan to evaluate him with an exercise stress stress Myoview scan which we'll try to schedule for Tuesday. It is okay for him to come off Plavix 5 days prior to his surgery. I would prefer that he continue a baby aspirin through surgery and I'll try to call Dr. Lindley Magnus to discuss  this.  Problem # 2:  C A D (ICD-414.00) He had multiple drug-eluting stents to the LAD in 2005 he's had no chest pain in his palm appears stable but we do plan evaluation with a Myoview scan as part of a preoperative evaluation. He asked how long he needs to stay on Plavix. He is concerned because he was told he could not take NSAIDs.  I told him that he could go ahead and take these drugs. Because of his multiple stents I would prefer that he stay on Plavix longer term. His updated medication list for this problem includes:    Plavix 75 Mg Tabs (Clopidogrel bisulfate) .Marland Kitchen... 1 by mouth qd    Adult Aspirin Ec Low Strength 81 Mg Tbec (Aspirin) .Marland Kitchen... 1 by mouth qd  Orders: EKG w/ Interpretation (93000)  Problem # 3:  HYPERTENSION, BENIGN (ICD-401.1) This is well controlled on current medications. His updated medication list for this problem includes:    Adult Aspirin Ec Low Strength 81 Mg Tbec (Aspirin) .Marland Kitchen... 1 by mouth qd  Problem # 4:  HYPERLIPIDEMIA (ICD-272.4) He had a very good lipid profile and current medications. His updated medication list for this problem includes:    Crestor 10 Mg Tabs (Rosuvastatin calcium) .Marland Kitchen... 1/2 tab mon,wed,fri  His updated medication list for this problem includes:    Crestor 10 Mg Tabs (Rosuvastatin calcium) .Marland Kitchen... 1/2 tab mon,wed,fri  Other Orders: Nuclear Stress Test (Nuc Stress Test)  Patient Instructions: 1)  Your physician recommends that you schedule a follow-up appointment in: 12/11 with Dr. Juanda Chance. 2)  Your physician has requested that you have an exercise stress myoview.  For further information please visit [email or web address]  Please follow instruction sheet, as given. To be done on Tuesday next week. 3)  Hold plavix 5 days prior Surgery.

## 2010-12-17 NOTE — Progress Notes (Signed)
Summary: urine culture (lmom 4/29)  Phone Note Outgoing Call Call back at [facility] Phone [phone]   Call placed by: Army Fossa CMA,  March 15, 2010 3:35 PM Summary of Call: Per Alfonse Flavors on Pt's Urine culture  please  make sure he is not acutely ill; also please  verify his pharmacy name, phone #. I'll call in meds as soon as sensitivities known. If he is sick call in Cipro 500 mg bid #14.Hopp  I called pts home and work number, Left message at home. He was not at work when I called. Army Fossa CMA  March 15, 2010 3:37 PM   Follow-up for Phone Call        pt does c/o some burning and frequency with urination. pt also interesting in having a urology referral .pt advise will call when culture back...........Marland KitchenFelecia Deloach CMA  March 15, 2010 4:58 PM     New/Updated Medications: CIPRO 500 MG TABS (CIPROFLOXACIN HCL) Take 1 tab  two times a day Prescriptions: CIPRO 500 MG TABS (CIPROFLOXACIN HCL) Take 1 tab  two times a day  #14 x 0   Entered by:   Jeremy Johann CMA   Authorized by:   Marga Melnick MD   Signed by:   Jeremy Johann CMA on 03/15/2010   Method used:   Faxed to ...       [facility] (retail)       [address]       [place], [place]  [address]       Ph: [phone] or [phone]       Fax: [phone]   RxID:   [id number]

## 2010-12-17 NOTE — Assessment & Plan Note (Signed)
Summary: HEAD CONGESTION/RH......[name] [name]   Vital Signs:  Patient profile:   77 year old male Weight:      154.6 pounds Temp:     99.3 degrees F oral Pulse rate:   64 / minute Resp:     15 per minute BP sitting:   118 / 60  (left arm) Cuff size:   regular  Vitals Entered By: Shonna Chock (February 12, 2010 4:24 PM) CC: Cold Symptoms x 6 days Comments REVIEWED MED LIST, PATIENT AGREED DOSE AND INSTRUCTION CORRECT    Primary Care Provider:  Marga Melnick MD  CC:  Cold Symptoms x 6 days.  History of Present Illness: Onset as head congestion & ST 02/07/2010 in [place] followed by progression with severe rhinitis on plane. Rx: Sudafed  Allergies: 1)  ! * Dilaudid 2)  ! Zocor 3)  ! * Regular Asa  Review of Systems General:  Complains of chills and fever; denies sweats. ENT:  Complains of nasal congestion and sinus pressure; denies ear discharge and earache; Frontal headache, facial pain & purulence. Resp:  Complains of wheezing; denies cough, shortness of breath, and sputum productive.  Physical Exam  General:  in no acute distress; alert,appropriate and cooperative throughout examination Ears:  External ear exam shows no significant lesions or deformities.  Otoscopic examination reveals clear canals, tympanic membranes are intact bilaterally without bulging, retraction, inflammation or discharge. Hearing is grossly normal bilaterally. Nose:  External nasal examination shows no deformity or inflammation. Nasal mucosa are erythematous without lesions or exudates. Hyponasal speech Mouth:  Oral mucosa and oropharynx without lesions or exudates.  Teeth in good repair. Lungs:  R wheezes and L wheezes; no increased WOB.   Heart:  Normal rate and regular rhythm. S1 and S2 normal without gallop, murmur, click, rub ,S4 Cervical Nodes:  No lymphadenopathy noted Axillary Nodes:  No palpable lymphadenopathy   Impression & Recommendations:  Problem # 1:  SINUSITIS- ACUTE-NOS  (ICD-461.9)  His updated medication list for this problem includes:    Flonase 50 Mcg/act Susp (Fluticasone propionate) .[name] [name]... 1 spray two times a day as "crossover" technique    Amoxicillin-pot Clavulanate 875-125 Mg Tabs (Amoxicillin-pot clavulanate) .[name] [name]... 1 q 12 hrs with a meal  Orders: Prescription Created Electronically [phone])  Problem # 2:  BRONCHITIS-ACUTE (ICD-466.0)  RAD component His updated medication list for this problem includes:    Advair Diskus 100-50 Mcg/dose Misc (Fluticasone-salmeterol) .[name] [name]... 1 puff q 12 hrs    Amoxicillin-pot Clavulanate 875-125 Mg Tabs (Amoxicillin-pot clavulanate) .[name] [name]... 1 q 12 hrs with a meal  Orders: Prescription Created Electronically [id number])  Complete Medication List: 1)  Crestor 10 Mg Tabs (Rosuvastatin calcium) .... 1/2 tab mon,wed,fri 2)  Flonase 50 Mcg/act Susp (Fluticasone propionate) .[name] [name].. 1 spray two times a day as "crossover" technique 3)  Omeprazole 20 Mg Cpdr (Omeprazole) .[name] [name].. 1 by mouth qd 4)  Plavix 75 Mg Tabs (Clopidogrel bisulfate) .[name] [name].. 1 by mouth qd 5)  Adult Aspirin Ec Low Strength 81 Mg Tbec (Aspirin) .[name] [name].. 1 by mouth qd 6)  Advair Diskus 100-50 Mcg/dose Misc (Fluticasone-salmeterol) .[name] [name].. 1 puff q 12 hrs 7)  Viagra 100 Mg Tabs (Sildenafil citrate) .... 1/2-1 once daily prn 8)  Allegra 180 Mg Tabs (Fexofenadine hcl) .[name] [name].. 1 by mouth once daily 9)  Amoxicillin-pot Clavulanate 875-125 Mg Tabs (Amoxicillin-pot clavulanate) .[name] [name].. 1 q 12 hrs with a meal  Patient Instructions: 1)  Neti pot once daily - two times a day until sinuses are clear. 2)  Drink as much fluid as  you can tolerate for the next few days. Prescriptions: ADVAIR DISKUS 100-50 MCG/DOSE  MISC (FLUTICASONE-SALMETEROL) 1 puff q 12 hrs  #1 x 11   Entered and Authorized by:   Marga Melnick MD   Signed by:   Marga Melnick MD on 02/12/2010   Method used:   Print then Give to Patient   RxID:   [id number] AMOXICILLIN-POT CLAVULANATE 875-125 MG TABS (AMOXICILLIN-POT  CLAVULANATE) 1 q 12 hrs with a meal  #20 x 0   Entered and Authorized by:   Marga Melnick MD   Signed by:   Marga Melnick MD on 02/12/2010   Method used:   Faxed to ...       [facility] (retail)       [address]       [place], [place]  [address]       Ph: [phone] or [phone]       Fax: [phone]   RxID:   [id number]

## 2010-12-17 NOTE — Progress Notes (Signed)
Summary: Radiology Report Results  Phone Note Call from Patient Call back at Home Phone [phone]   Caller: Patient Details for Reason: RXay result request Summary of Call: Good reports , but Orthopedic consult if symptoms persist . One consideration is a NITROGLYCERIN patch which enhances stem cell migation into areas of soft tissue injury. Hopp  Patient informed copy of report to be mailed today, patient will research the pro's and con's of Nitro patch and call back if he would like to consider./Chrae [facility] CMA  October 07, 2010 2:50 PM

## 2010-12-19 NOTE — Assessment & Plan Note (Signed)
Summary: per check out  Medications Added ALLEGRA 180 MG  TABS (FEXOFENADINE HCL) as needed        Visit Type:  Follow-up Primary Andrina Locken:  Marga Melnick MD   History of Present Illness: "[name] Lame" [name] is 77 years old and comes in for followup management of CAD.  In 2005 he had unstable angina and had 2 overlapping Cypher stents in the proximal LAD and one Cypher stent in the distal LAD. He's done quite well since that time has been active playing tennis and other activities without chest pain. However he did not have very many symptoms prior to his stent procedure in 2005.  We did a Myoview scan last year to evaluate him prior to urologic surgery and this was negative.  He's done quite well over the last year with no chest pain shortness breath or palpitations. 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  His other problems include hypertension, and hyperlipidemia. His cholesterol panel in April of this year showed a total cholesterol of 159, an HDL of 70, and LDL of 78.  Current Medications (verified): 1)  Crestor 10 Mg Tabs (Rosuvastatin Calcium) .... 1/2 Tab Mon,wed,fri 2)  Flonase 50 Mcg/act Susp (Fluticasone Propionate) .Marland Kitchen.. 1 Spray  One  Time A Day As "crossover" Technique 3)  Omeprazole 20 Mg  Cpdr (Omeprazole) .Marland Kitchen.. 1 By Mouth Qd 4)  Plavix 75 Mg  Tabs (Clopidogrel Bisulfate) .Marland Kitchen.. 1 By Mouth Qd 5)  Adult Aspirin Ec Low Strength 81 Mg  Tbec (Aspirin) .Marland Kitchen.. 1 By Mouth Qd 6)  Advair Diskus 100-50 Mcg/dose  Misc (Fluticasone-Salmeterol) .Marland Kitchen.. 1 Puff Q 12 Hrs 7)  Viagra 100 Mg  Tabs (Sildenafil Citrate) .... 1/2-1 Once Daily Prn 8)  Allegra 180 Mg  Tabs (Fexofenadine Hcl) .... As Needed 9)  Mvi .... As Directed 10)  Glucosamine/chondroitin .... As Directed 11)  Rapaflo 4 Mg Caps (Silodosin) .Marland Kitchen.. 1 By Mouth At Bedtime 12)  Tramadol Hcl 50 Mg Tabs (Tramadol Hcl) .Marland Kitchen.. 1 Every 6 Hrs As  Needed Pain  Allergies: 1)  ! * Dilaudid 2)  ! Zocor 3)  ! * Regular Asa  Past History:  Past Medical History: Reviewed history from 03/07/2010 and no changes required. GERD Nephrolithiasis, PMH of X? 5, Dr Annabell Howells Asthma ; 2001 intestinal infection ; PNA 2001 C A D (ICD-414.00) Colonic polyps, PMH  of Skin cancer, PMH of, basal cell X 2, Dr Londell Moh, Derm Hematuria ,recurrent , negative evaluation Dr Annabell Howells 2009 Coronary heart disease, status post overlapping drug-eluting stents     in the proximal left anterior descending and a drug-eluting stent     in distal left anterior descending 2005, now stable.  Good left ventricular function. Hyperlipidemia. Elevated  liver function tests on Zocor.  Review of Systems       ROS is negative except as outlined in HPI.   Vital Signs:  Patient profile:   77 year old male Height:      67 inches Weight:      156 pounds BMI:     24.52 Pulse rate:   60 / minute BP sitting:   110 / 70  (left arm)  Vitals Entered By: Laurance Flatten CMA (November 01, 2010 3:16 PM)  Physical Exam  Additional Exam:  Gen. Well-nourished, in no distress   Neck: No JVD, thyroid not enlarged, no carotid bruits Lungs: No tachypnea, clear  without rales, rhonchi or wheezes Cardiovascular: Rhythm regular, PMI not displaced,  heart sounds  normal, no murmurs or gallops, no peripheral edema, pulses normal in all 4 extremities. Abdomen: BS normal, abdomen soft and non-tender without masses or organomegaly, no hepatosplenomegaly. MS: No deformities, no cyanosis or clubbing   Neuro:  No focal sns   Skin:  no lesions    Impression & Recommendations:  Problem # 1:  C A D (ICD-414.00) He had 3 drug-eluting stents to the LAD in 2005. He's had no recent chest pain is probably stable. He doesn't have much in the way of warning symptoms so we might consider periodic stress testing. He had a negative Myoview scan last June.  We will plan followup with Dr. Excell Seltzer in one  year. His updated medication list for this problem includes:    Plavix 75 Mg Tabs (Clopidogrel bisulfate) .Marland Kitchen... 1 by mouth qd    Adult Aspirin Ec Low Strength 81 Mg Tbec (Aspirin) .Marland Kitchen... 1 by mouth qd  Orders: EKG w/ Interpretation (93000)  Problem # 2:  HYPERLIPIDEMIA (ICD-272.4) He had an excellent lipid profile earlier this year. We will continue current therapy. His updated medication list for this problem includes:    Crestor 10 Mg Tabs (Rosuvastatin calcium) .Marland Kitchen... 1/2 tab mon,wed,fri  Patient Instructions: 1)  Your physician recommends that you continue on your current medications as directed. Please refer to the Current Medication list given to you today. 2)  Your physician wants you to follow-up in: 1 year with Dr. Excell Seltzer.  You will receive a reminder letter in the mail two months in advance. If you don't receive a letter, please call our office to schedule the follow-up appointment.

## 2010-12-19 NOTE — Miscellaneous (Signed)
  Clinical Lists Changes  Observations: Added new observation of NUCLEAR NOS: Exercise Capacity: Good exercise capacity. BP Response: Normal blood pressure response. Clinical Symptoms: There is dyspnea. ECG Impression: No significant ST segment change suggestive of ischemia. Overall Impression: Low risk stress nuclear study. Overall Impression Comments: Mild fixed thinning of inferior, inferoseptal and inferoapical walls. No ischemia. (05/07/2010 16:13)      Nuclear Study  Procedure date:  05/07/2010  Findings:      Exercise Capacity: Good exercise capacity. BP Response: Normal blood pressure response. Clinical Symptoms: There is dyspnea. ECG Impression: No significant ST segment change suggestive of ischemia. Overall Impression: Low risk stress nuclear study. Overall Impression Comments: Mild fixed thinning of inferior, inferoseptal and inferoapical walls. No ischemia.

## 2010-12-24 ENCOUNTER — Telehealth: Payer: Self-pay | Admitting: Internal Medicine

## 2011-01-02 NOTE — Progress Notes (Signed)
Summary: Change GERD med due to Plavix  Phone Note Call from Patient Call back at Work Phone [phone]   Summary of Call: Patient left message on triage that his pharmacy suggested that he change his acid reflux med to Protonix due to his Plavix. Please advise if the patient should make this change.  Initial call taken by: Lucious Groves CMA,  December 24, 2010 9:42 AM  Follow-up for Phone Call        usually Ranitidine 150 mg bid  is recommended in place of the agents such as Protonix or Omeprazole; it costs $10 for 180 pills Follow-up by: Marga Melnick MD,  December 24, 2010 12:53 PM  Additional Follow-up for Phone Call Additional follow up Details #1::        Left message for patient to call back.Marland KitchenMarland KitchenHarold Barban  December 24, 2010 4:19 PM  Patient notified and uses [facility] [address]. Lucious Groves CMA  December 25, 2010 11:29 AM     New/Updated Medications: RANITIDINE HCL 150 MG TABS (RANITIDINE HCL) 1 by mouth two times a day Prescriptions: RANITIDINE HCL 150 MG TABS (RANITIDINE HCL) 1 by mouth two times a day  #180 x 1   Entered by:   Lucious Groves CMA   Authorized by:   Marga Melnick MD   Signed by:   Lucious Groves CMA on 12/25/2010   Method used:   Electronically to        [id number] (retail)       [address]       [place], [place]  [address]       Ph: [phone]       Fax: [phone]   RxID:   [id number]

## 2011-01-20 ENCOUNTER — Telehealth (INDEPENDENT_AMBULATORY_CARE_PROVIDER_SITE_OTHER): Payer: Self-pay | Admitting: *Deleted

## 2011-01-28 NOTE — Progress Notes (Signed)
Summary: switch back to omeprazole  Phone Note Call from Patient Call back at Home Phone [phone]   Caller: Patient Summary of Call: Patient called would like to go back on omeprazole says he isn't getting any relief w/ ranitidine is aware that there is a potential conflict w/ plavix but reflux isn't being solve by taking ranitidine. Initial call taken by: Doristine Devoid CMA,  January 20, 2011 12:09 PM  Follow-up for Phone Call        the Omeprazole / Plavix potential interaction precludes my prescribing the PPI. consider  trial of Sulcalfate (see Rx)  three times a day pre meals prn . If no better; he should discuss these issues with MD who Rxed Plavix; ie , is it absolutely necessary?  Additional Follow-up for Phone Call Additional follow up Details #1::        left message on machine ........Marland KitchenDoristine Devoid CMA  January 20, 2011 3:12 PM   spoke w/ patient says that he is ok w/ information and if medication doesn't help will contact other physician to d/c plavix if possible........Marland KitchenDoristine Devoid CMA  January 22, 2011 8:49 AM     New/Updated Medications: SUCRALFATE 1 GM TABS (SUCRALFATE) dissolve 1 pill in 5 cc of water & take three times a day pre meals as needed #90 Prescriptions: SUCRALFATE 1 GM TABS (SUCRALFATE) dissolve 1 pill in 5 cc of water & take three times a day pre meals as needed #90  #61month x 1   Entered by:   Doristine Devoid CMA   Authorized by:   Marga Melnick MD   Signed by:   Doristine Devoid CMA on 01/22/2011   Method used:   Electronically to        [id number] (retail)       [address]       [place], [place]  [address]       Ph: [phone]       Fax: [phone]   RxID:   [id number]

## 2011-02-18 ENCOUNTER — Other Ambulatory Visit: Payer: Self-pay | Admitting: Internal Medicine

## 2011-04-01 NOTE — Assessment & Plan Note (Signed)
[facility]                            CARDIOLOGY OFFICE NOTE   NAME:[name], [name]                    MRN:          [id number]  DATE:09/22/2007                            DOB:          1929/05/27    PRIMARY CARE PHYSICIAN:  Dr. Marga [name].   CLINICAL HISTORY:  [name] [name] is 77 years old, and in 2005 presented with  unstable angina, and had overlapping Cypher stents placed in the mid  LAD, and a 3rd Cypher stent placed in the distal LAD.  He has good LV  function.  He has done quite well since that time.  He has had no angina  or palpitations.  He has had some vague left-sided chest discomfort,  which appears to be related mostly to movement in his upper extremities  rather than exercise.   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   PAST MEDICAL HISTORY:  Significant for asthma.  He had elevated liver  function test on Zocor.  He also has hyperlipidemia and GERD.   CURRENT MEDICATIONS:  Include aspirin, Plavix, Crestor, glucosamine,  melatonin, and Protonix.   EXAMINATION:  Blood pressure 142/58.  Pulse 62 and regular.  There is no venous distention.  The carotids were no full, and there  were no bruits.  CHEST:  Was clear.  CARDIAC:  Rhythm was regular.  I could hear no murmurs or gallops.  ABDOMEN:  Soft without organomegaly.  Peripheral pulses were full.  There was no peripheral edema.   Electrocardiogram was normal.   IMPRESSION:  1. Coronary artery disease status post prior PCI with 2 overlapping      and 1 non-overlapping Cypher stent in the left anterior descending.  2. Good left ventricular function.  3. Gastroesophageal reflux disease.  4. Hyperlipidemia.  5. Abnormal liver function test on Zocor.  6. History of asthma.   RECOMMENDATIONS:  I think [name] [name] is doing well.  I encouraged him to get  more exercise.  He is always watching the salt in his  diet.  He will  plan to get his blood pressure checked in  followup.  He will also arrange a follow up with Dr. Alwyn [name], who will  check lipids.  I will see him back in followup in a year.     Bruce Elvera Lennox Juanda Chance, MD, [facility]  Electronically Signed    BRB/MedQ  DD: 09/22/2007  DT: 09/22/2007  Job #: [id number]

## 2011-04-01 NOTE — Assessment & Plan Note (Signed)
[facility]                            CARDIOLOGY OFFICE NOTE   NAME:[name], [name] OLIFF                    MRN:          [id number]  DATE:10/02/2008                            DOB:          [birth year]    PRIMARY CARE PHYSICIAN:  Titus Dubin. Alwyn Ren, MD, FACP, [facility]   CLINICAL HISTORY:  [name] [name] is 77 years old and returned for followup  management of his coronary heart disease.  He presented in 2005 with  unstable angina, had 2 overlapping Cypher stents placed in the proximal  LAD and a third Cypher stent in distal LAD.  He has good LV function.  He has done quite well since that time and he has had no recent chest  pain, shortness of breath, or palpitations.   PAST MEDICAL HISTORY:  Significant for hiatal hernia with reflux.  He  also has asthma.  He has had elevated liver function tests on Zocor in  the past.  He has hyperlipidemia and he has had borderline hypertension,  but had not been on any medication for that.   CURRENT MEDICATIONS:  1. Aspirin.  2. Plavix.  3. Crestor 10 mg half tablet on Monday, Wednesday, and Friday.  4. Prilosec.   PHYSICAL EXAMINATION:  VITAL SIGNS:  The blood pressure was 173/73 and  pulse 51 and regular.  He indicated blood pressure was 121/65 at Dr.  Frederik Pear office.  NECK:  There was no vein distention.  The carotid pulses were full  without bruits.  CHEST:  Clear.  CARDIAC:  Rhythm was regular.  He had no murmurs or gallops.  ABDOMEN:  Soft with normal bowel sounds.  EXTREMITIES:  Peripheral pulses were full with no peripheral edema.   Electrocardiogram was normal.   IMPRESSION:  1. Coronary heart disease, status post overlapping drug-eluting stents      in the proximal left anterior descending and a drug-eluting stent      in distal left anterior descending 2005, now stable.  2. Good left ventricular function.  3. Gastroesophageal reflux disease.  4. Asthma.  5. Hyperlipidemia.  6. Abnormal liver  function tests on Zocor.  7. Elevated blood pressure today.   RECOMMENDATIONS:  [name] [name] is doing very well.  All of his risk  factors appear to be under good control except his blood pressure was  quite elevated today.  He states it has been quite good with other  readings.  We will plan to have him to come back and check a blood  pressure in a week and decide if any change in therapy is needed.  Otherwise, I will see him back in followup in a year.     Bruce Elvera Lennox Juanda Chance, MD, [facility]  Electronically Signed    BRB/MedQ  DD: 10/02/2008  DT: 10/03/2008  Job #: [id number]

## 2011-04-04 ENCOUNTER — Other Ambulatory Visit: Payer: Self-pay | Admitting: *Deleted

## 2011-04-04 MED ORDER — CLOPIDOGREL BISULFATE 75 MG PO TABS: 75.0000 mg | ORAL_TABLET | Freq: Every day | ORAL | Status: DC

## 2011-04-04 NOTE — Assessment & Plan Note (Signed)
[facility]                              CARDIOLOGY OFFICE NOTE   NAME:[name] [name], [name] [name]                      MRN:          [id number]  DATE:09/01/2006                            DOB:          1928-12-23    PRIMARY CARE PHYSICIAN:  Dr. Marga Melnick.   CLINICAL HISTORY:  Mr. Ahlers is 77 years old and presented with unstable  angina in 2005 and had two overlapping Cipher stents placed in the mid LAD  and a separate Cipher stent placed in the distal LAD. He has done quite well  since that done. Has had no chest pain, shortness of breath or palpitations.  He plays tennis on a regular basis. Plays less recently due to increased  time at work.   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   He does say he has had some business after exercise.   PAST MEDICAL HISTORY:  Significant for elevated liver function tests on  Zocor. He also has a history of asthma, hyperlipidemia, GERD.   CURRENT MEDICATIONS:  1. Glucosamine.  2. Aspirin.  3. Melatonin.  4. Crestor 10 mg 1/2 tablet three times a week.   PHYSICAL EXAMINATION:  VITAL SIGNS:  Blood pressure 133/68, pulse 57 and  regular.  NECK:  There was no venous distention. Carotid pulses were full without  bruits.  HEART:  Clear. Cardiac rhythm was regular. No murmurs, rubs or gallops.  ABDOMEN:  Soft without organomegaly.  EXTREMITIES:  There was no peripheral edema.   An ECG was normal with sinus bradycardia.   IMPRESSION:  1. Coronary artery disease status post placement of two overlapping and      one nonoverlapping Cipher stent in the LAD October 2005 now stable.  2. Good LV function.  3. Gastroesophageal reflux disease.  4. Hyperlipidemia.  5. Abnormal liver function tests on Zocor.  6. History of asthma.   RECOMMENDATIONS:  I think the patient is doing well. He does have some  increased stress at night, and this  may be causing some of his symptoms of  dizziness and slight elevation of his blood pressure compared to normal.  Will plan to arrange for him to have an exercise rest stress Myoview scan  getting in touch with him after we have the results. He is scheduled to have  a physical with Dr. Alwyn Ren in the near future. Will manage his lipids. I  plan to see him back in a year if his Myoview scan is normal.            ______________________________  Everardo Beals. Juanda Chance, MD, [facility]     BRB/MedQ  DD:  09/01/2006  DT:  09/02/2006  Job #:  [id number]

## 2011-04-04 NOTE — Consult Note (Signed)
NAME:  [name] [name], GUTHRIDGE NO.:  [id number]   MEDICAL RECORD NO.:  [id number]          PATIENT TYPE:  INP   LOCATION:  3728                         FACILITY:  [facility]   PHYSICIAN:  Charlies Constable, M.D. [facility] DATE OF BIRTH:  05/31/1929   DATE OF CONSULTATION:  09/04/2004  DATE OF DISCHARGE:                                   CONSULTATION   CONSULTING PHYSICIAN:  Charlies Constable, M.D. [facility]   HISTORY:  Mr. Orrico is a 77 year old white male who was admitted yesterday  from his primary physician's office secondary to chest discomfort.  Mr.  Trapani states approximately 2 weeks ago on the way to [place] for business  he noticed a raw burning sensation in his anterior chest.  He gave it a 3 on  the scale of 3-10.  It did not radiate nor was it associated with shortness  of breath, diaphoresis, nausea or vomiting.  He would relate to indigestion  and associate it with increased stress.  The duration would last sometimes  up to four to five hours.  He was very vague on the frequency and specific  duration of this discomfort.  While he was in [place], the discomfort would  not be relieved with rest and relaxation.  He has not had any rest or  nocturnal episodes.  When he came back from [place], he has been very  active, swing dancing without any problems.  He played tennis for 2-1/2  hours on Saturday without difficulty.  However, on Sunday and Monday he felt  the same symptoms while playing tennis.  He continued to play tennis despite  his symptoms.  When he was finished playing tennis, he would get something  to eat and relax and he would eventually feel better.  He also described  some various aches in his left lower rib cage and back.  It is hard to get a  clear picture as to the frequency or the duration of the discomfort.   ALLERGIES:  Notable for DILAUDID allergy, causing throat edema.  He has  ASPIRIN intolerance which causes stomach upset when he takes several aspirin  at  once.   MEDICATIONS ON ADMISSION:  1.  Multivitamin daily.  2.  Glucosamine, unknown dosage.  3.  Vitamin E, unknown dosage.  4.  Melatonin, unknown dosage.  5.  Aciphex 20 mg daily.  6.  Advair 100/50 b.i.d. p.r.n.   PAST MEDICAL HISTORY:  He has a history of GERD, hiatal hernia,  diverticulosis, and polyps.  Last colonoscopy 6/05 by Dr. Jarold Motto.  He has  a history of asthma with pneumonia in 2001 and gastritis in 2001.   SOCIAL HISTORY:  He resides in [place] with his wife.  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  He has one son, one daughter,  one grandchild - all alive and in good health.  He quit smoking in 1962 and  prior to that he smoked less than a half a pack per week for less than 5  years.  He maintains a low fat diet, denies any drugs.  He drinks a glass of  wine every day.  He is very active, playing tennis almost daily, swing  dancing, skiing, lifting weights, and bicycle riding.   FAMILY HISTORY:  His mother died at the age of 79 secondary to stroke  complications and a history of hypertension and arthritis.  His father died  at the age of 74 of unknown reasons but a history of alcohol and liver  problems.  He has three brothers and one sister alive and well, one brother  has a history of prostate cancer.   REVIEW OF SYSTEMS:  Notable for occasional sinus congestion, glasses,  occasional wheezing, some arthralgias in his hips and hands, and GERD  symptoms.  He has also been under a lot of stress in regards to his business  in that things are not happening the way that they should.   PHYSICAL EXAMINATION:  Vital signs:  Temperature 97.7, blood pressure  111/65, pulse 57 and regular, respirations 20 and regular.  Admission weight  153.  Saturations 96% on room air.  HEENT:  Unremarkable.  Neck:  Supple,  without thyromegaly, adenopathy, JVD or carotid bruits.  Heart:  Regular  rate and rhythm, normal S1/S2, no murmurs, rubs, clicks or  gallops.  All  peripheral pulses are symmetrical and intact without abdominal or femoral  bruits.  Chest:  Symmetrical excursion.  Lungs:  Clear to auscultation.  Integument:  Intact, without rashes or lesions.  Abdomen:  Bowel sounds  present, soft, without organomegaly, masses or tenderness.  Extremities:  No  clubbing, cyanosis or edema.  Musculoskeletal:  Unremarkable.  Neurologic:  Unremarkable.   EKG shows sinus bradycardia with a rate of 51, normal axis, early R wave and  nonspecific ST-T wave changes, normal intervals.  Old EKG is not available  for comparison.   Admission hemoglobin was 15.4, hematocrit 443.8, normal indices; platelets  198,000, WBC 7.4.  Sodium 138, potassium 3.4, BUN 11, creatinine 1.1,  glucose 168.  Amylase 60, lipase 28.  CK-MBs have been negative x2, however,  troponins have been 0.09 and 0.09.   IMPRESSION:  Prolonged atypical/typical chest discomfort difficult to  discern.  Electrocardiogram and CK-MBs are negative for myocardial  infarction, however, troponins are borderline.  Aspirin intolerant and only  risk factor appears to be age.  History as previously.   DISPOSITION:  We will order a chest x-ray, PT, PTT, fasting lipids, TSH, and  LFTs.  Dr. Juanda Chance reviewed the patient's history, spoke with and examined  the patient.  He feels that the patient should undergo cardiac  catheterization for definitive diagnosis of possible coronary artery  disease.  The benefits, risks and procedure have been explained to Mr.  Zepeda by Dr. Juanda Chance and he agrees to proceed as planned.  He has been  added onto the schedule for today.  Further cardiac plans will depend on  results of cardiac catheterization.       EW/MEDQ  D:  09/04/2004  T:  09/04/2004  Job:  [id number]   cc:   Charlies Constable, M.D. [facility]

## 2011-04-04 NOTE — H&P (Signed)
NAMEDEADRIAN, [name] [name] NO.:  [id number]   MEDICAL RECORD NO.:  [id number]          PATIENT TYPE:  INP   LOCATION:  3728                         FACILITY:  [facility]   PHYSICIAN:  Titus Dubin. Alwyn Ren, M.D. [facility] OF BIRTH:  [birth year]   DATE OF ADMISSION:  09/03/2004  DATE OF DISCHARGE:                                HISTORY & PHYSICAL   Mr. Durrell has been having intermittent indigestion over the past month  manifested as a burning sensation substernally.  It has occurred while  playing tennis and stops within two to three minutes of stopping exercise.  It is described as a square of burning in the lower chest which is much  like the burning he has when skiing in cold weather.  It has been worse over  the past three to four days prior to admission.  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  He has had no symptoms postprandially.  He  has had no nausea or diaphoresis.  He has been playing tennis doubles with  high-level exercise drills which have caused the pain.  He is on Aciphex  which controls reflux.  He has had no associated shortness of breath.   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   PAST MEDICAL HISTORY:  1.  Intestinal infection in 2001, a gastroenteritis for which he was placed      in the hospital for observation.  2.  He had pneumonia as an outpatient in 2001.  3.  In 2002, he had renal calculus for which lithotripsy was performed as an      outpatient.  4.  Colonoscopy in June 2005 revealed diverticulosis and polyps.  5.  Endoscopy revealed esophageal reflux and hiatal hernia.  These studies      were performed by Dr. Sheryn Bison.  6.  He has asthma which is well controlled.   FAMILY HISTORY:  Negative for cancer, diabetes, or stroke.  There is no  premature cardiovascular disease.  His father had heart disease, dying in  his 52s.  Mother had hypertension.   Brother had asthma.   He quit smoking in 1962.  He drinks socially.   He is on glucosamine chondroitin for arthralgias.  He takes multivitamins,  vitamin E, and melatonin.  He is on Aciphex 20 mg daily.  He uses Advair  100/50 one every 12 hours as needed.   ASPIRIN causes GI upset.   The remainder of the Review of Systems is negative.  All systems were  assessed.   PHYSICAL EXAMINATION:  GENERAL:  On exam, he is in no acute distress.  VITAL SIGNS:  Weight is 2 pounds at 158.  Pulse is 72 and regular,  respiratory rate 16, blood pressure 130/58.  HEENT:  Arterial narrowing is noted on fundus exam.  He has an upper dental  plate which was necessitated by old trauma while in service.  NECK:  Thyroid normal to palpation.  CARDIOPULMONARY:  Exam was unremarkable except for slightly decreased pedal  pulses.  Homan's sign  is negative.  He has no edema.  ABDOMEN:  He has no organomegaly or masses, and there is no abdominal  tenderness.  There is a ventral hernia noted.  GENITOURINARY:  Exam initially deferred.  LYMPH:  He has no lymphadenopathy.  MUSCULOSKELETAL:  Exam unremarkable.  NEUROLOGIC:  There are no neurological or psychiatric deficits except he is  exhibiting denial.  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  A compromise  was that we did a troponin and CPK with him to return after completing some  of those errands.   LABORATORY AND X-RAY DATA:  CPK was normal at 69 with a normal MB, but the  troponin was 0.09 with normals less than 0.04.   PLAN:  Further discussion about the risks of observing him overnight and  then performing stress Cardiolite if the enzymes were negative was  conducted, and he agreed to enter the hospital.  It is planned to collect  enzymes and perform stress Cardiolite early in the morning if enzymes are  negative.  If enzymes are positive or symptoms progress, then   catheterization will be necessary.   ADDENDUM:  DILAUDID caused upper airway edema, and morphine sulfate will be  used for chest pain.       WFH/MEDQ  D:  09/03/2004  T:  09/03/2004  Job:  [id number]

## 2011-04-04 NOTE — Cardiovascular Report (Signed)
NAMEGODSON, POLLAN NO.:  [id number]   MEDICAL RECORD NO.:  [id number]          PATIENT TYPE:  INP   LOCATION:  3728                         FACILITY:  [facility]   PHYSICIAN:  Charlies Constable, M.D. [facility] DATE OF BIRTH:  1929-07-10   DATE OF PROCEDURE:  09/04/2004  DATE OF DISCHARGE:                              CARDIAC CATHETERIZATION   PROCEDURE:  Cardiac catheterization and percutaneous coronary intervention.   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  Over the last two weeks, he has been having  burning substernal chest pain, and he had recurrent episodes the day before  yesterday and saw Dr. Alwyn Ren yesterday, who measured a troponin which was  borderline elevated and used this to convince him and admitted him to the  hospital for evaluation.  We saw him in consultation today and made a  decision to evaluate him with angiography.   PROCEDURE:  The procedure was performed via the right femoral artery using  an arterial sheath and 6 French preformed coronary catheters.  A front wall  arterial puncture was performed, and Omnipaque contrast was used.  At the  completion of the diagnostic study, we made a decision to do intervention on  the left anterior descending artery which was a complex lesion.   The patient was given Angiomax bolus in infusion and was enrolled in the  Triton trial and was randomized to either Plavix or a study drug which was  given prior to the intervention.  We used a Q4 6 [place] guiding catheter  with side holes and two Clinical cytogeneticist wires.  We passed one wire down the  LAD and one down the diagonal branch.  We first dilated the LAD with a 2.25  x 15 mm Maverick, performing two inflations to 8 atmospheres for 30 seconds.  We then passed a 2.5 x 6 mm cutting balloon into the diagonal branch and  performed three  inflations to 8 atmospheres for 30 seconds.  We then  performed IVUS to make a decision if we should treat the proximal lesion  which was heavily calcified.  Based on these measurements, we decided to  treat the proximal lesion and use two overlapping stents.  There was also a  lesion in the distal LAD which we felt needed to be treated. This was  estimated at 80%.  We first treated this with the 2.5 x 20 mm Quantum  Maverick, and then we deployed a 2.5 x 18 mm Cypher stent.  We then deployed  a 2.5 x 23 mm Cypher stent overlapping the diagonal branch and covering the  initial severe lesion in the mid-LAD.  We employed one inflation up to 12  atmospheres for 30 seconds.  We then employed a second 2.5 x 23 mm Cypher  stent overlapping the first stent and deploying this with one inflation up  to 14 atmospheres for 30 seconds.  We then used a 2.75 x 15 mm Quantum  Maverick  and postdilated the stent in the distal LAD and then up and down  the overlapping stents in the mid and proximal LAD, performing multiple  inflations up to 18 atmospheres for 30 seconds.  Final diagnostic testing  was then performed through the guiding catheter.  The patient tolerated the  procedure well and left the laboratory in satisfactory condition.   RESULTS:  The left main coronary artery was a very short vessel.  It was  free of disease.   The left anterior descending artery was a heavily calcified vessel that gave  rise to two diagonal branches and three septal perforators.  There was a 99%  stenosis in the midvessel after the second diagonal branch and after the  third septal perforator, with TIMI I flow distally.  There was 50% ostial  narrowing in the diagonal branch before the 99% stenosis.  There was also a  lesion of 80% severity in the proximal LAD with moderately heavy  calcification.   The circumflex artery gave rise to an atrial branch, a small marginal  branch, and a large posterolateral branch.   These vessels were free of  disease.   The right coronary artery was irregular and supplied a right ventricular  branch, a posterior descending branch, and a posterolateral branch.  The  right coronary artery also filled the distal LAD, a small portion of the  distal LAD, by collaterals.   Following stenting of the lesion in the distal LAD, the stenosis improved  from 80% to 0%.  Following stenting of the lesions in the mid and proximal  LAD with overlapping stents, the mid-LAD improved from 99% to 0%, and the  proximal lesion improved from 80% to 0%, and the flow improved from TIMI I  to TIMI III flow.  The lesion in the diagonal branch was initially 50% and  improved to less than 20% with cutting balloon angioplasty and returned to  about 50% but with good flow following the placement of the stents in the  LAD.   The left ventriculogram performed in RAO projection showed good wall motion,  with no areas of hypokinesis.  The estimated ejection fraction was 55%.   CONCLUSION:  1.  Coronary artery disease, with 80% narrowing in the proximal LAD, 99%      narrowing in the mid-LAD, with TIMI I flow distally, 80% narrowing in      the distal LAD, and 50% narrowing in the second diagonal branch of the      LAD, normal circumflex and right coronary arteries, with normal LV      function.  2.  Successful stenting of the lesion in the distal LAD with a Cypher drug-      eluting stent, with improvement in narrowing from 80% to 0%.  3.  Successful placement of tandem overlapping Cypher drug-eluting stents in      the proximal and mid-LAD, with improving in the mid stenosis from 99% to      0, and improvement in the proximal stenosis from 80% to 0, and cutting      balloon angioplasty of the diagonal branch of the LAD, with residual 50%      stenosis but good flow (branch stenosis).  DISPOSITION:  The patient will return to the postanesthesia care unit for  further observation.        BB/MEDQ  D:  09/04/2004  T:  09/04/2004  Job:  [id number]   cc:   Titus Dubin. Alwyn Ren, M.D. [facility]  Rene Paci, M.D. [facility]  [address] [place], [place] [address]   Charlies Constable, M.D. [facility]

## 2011-04-04 NOTE — Consult Note (Signed)
[name], [name] NO.:  [id number]   MEDICAL RECORD NO.:  [id number]          PATIENT TYPE:  INP   LOCATION:  3728                         FACILITY:  [facility]   PHYSICIAN:  Joellyn Rued, P.A. [facility] DATE OF BIRTH:  1929/07/24   DATE OF CONSULTATION:  DATE OF DISCHARGE:                                   CONSULTATION   Audio too short to transcribe (less than 5 seconds)       EW/MEDQ  D:  09/04/2004  T:  09/04/2004  Job:  [id number]

## 2011-04-04 NOTE — Discharge Summary (Signed)
NAMENIRANJAN, RUFENER             ACCOUNT NO.:  [id number]   MEDICAL RECORD NO.:  [id number]          PATIENT TYPE:  INP   LOCATION:  6523                         FACILITY:  [facility]   PHYSICIAN:  Rene Paci, M.D. LHCDATE OF BIRTH:  [birth year]   DATE OF ADMISSION:  09/03/2004  DATE OF DISCHARGE:  09/05/2004                                 DISCHARGE SUMMARY   DISCHARGE DIAGNOSES:  1.  Chest pain.  2.  Elevated troponin.  3.  Stenosis of 80% of proximal left anterior descending, 99% stenosis of      the mid left anterior descending, and 80% stenosis of the distal left      anterior descending .   BRIEF ADMISSION HISTORY:  Mr. Mcnellis is a 77 year old white male who was  admitted with intermittent exertional chest pain.  He was noted to have  mildly elevated troponin at 0.09.  There were no EKG changes.  However, the  patient was admitted to rule out myocardial infarction and for cardiac  evaluation.   PAST MEDICAL HISTORY:  1.  Gastroesophageal reflux disease and hiatal hernia.  2.  Diverticulosis.  3.  Colonic polyps, last endoscopy and colonoscopy in June 2005.  4.  Asthma.  5.  History of pneumonia in 2001.  6.  Gastroenteritis in 2001.   HOSPITAL COURSE:  #1.  CARDIOVASCULAR:  The patient presented with chest pain and borderline  elevated troponin.  The patient had minimal cardiac risk factors.  The  patient was evaluated by cardiology.  Dr. Juanda Chance recommended cardiac  catheterization.  The patient underwent cardiac catheterization on September 04, 2004, revealing 80% proximal LAD stenosis, 99% mid LAD stenosis, and 80%  distal LAD stenosis.  The patient had three stents placed.  Although  patient's cholesterol and triglycerides were normal, the patient's LDL was  elevated.  Dr. Juanda Chance recommended initiating statin medication.  He also  recommended continuing aspirin.  The patient has been started in the TRITON  study, and he will take a study medication as well.   #2.   HYPOKALEMIA:  This was mild and being replaced.   DISCHARGE LABORATORY AND X-RAY DATA:  BUN 8, creatinine 1, potassium 3.4,  hemoglobin 14.1.  Troponin was 0.31.  Total cholesterol 182, triglycerides  78, HDL 65, LDL 101.   DISCHARGE MEDICATIONS:  1.  Coated aspirin 325 mg daily.  2.  Zocor 40 mg q.h.s.  3.  Nitroglycerin p.r.n.  4.  TRITON study 2 tablets by mouth daily.  The patient is not on Plavix for      this reason.   FOLLOW UP:  Dr. Charlies Constable on September 11, 2004, at 10:30 a.m. and with Dr.  Alwyn Ren in one to two weeks.      Laur   LC/MEDQ  D:  09/05/2004  T:  09/05/2004  Job:  [id number]   cc:   Charlies Constable, M.D. [facility]   Titus Dubin. Alwyn Ren, M.D. [facility]

## 2011-04-04 NOTE — Assessment & Plan Note (Signed)
[facility]                        [place] OFFICE NOTE   NAME:Knebel, HAYK DIVIS                    MRN:          [id number]  DATE:01/22/2007                            DOB:          1929-10-22    [name] [name] was seen acutely for abdominal pain which appeared upon  awakening. It seemed to be aggravated by meals.  It was localized the  umbilical line from the left mid-quadrant to the right. It was  associated with flatulence and minimal bloating. He has also had some  nausea. He specifically denies dysphagia or melena.   There have been no constitutional symptoms such as fever, chills,  sweats, or weight loss. He has had no cardiopulmonary symptoms. He also  denies any genitourinary symptoms such as dysuria, difficulty starting a  stream, hematuria, or flank pain.   PAST HISTORY:  Hospitalization for intestinal infection in 2001. He has  also had renal calculi for which he has had lithotripsy as an  outpatient. Colonoscopy revealed diverticulosis in a polyp in 2005.  Endoscopy revealed hiatal hernia and evidence of reflux.   He denies glaucoma.   MEDICATIONS:  1. Glucosamine.  2. Multivitamins.  3. Melatonin.  4. Advair.  5. Baby aspirin.  6. Plavix.  7. Crestor.  8. Omeprazole.   Weight is up 3 pounds to 158. He is afebrile. Pulse is 64, respiratory  rate 16, blood pressure 150/72. He has no scleral icterus. There is no  jaundice. His skin is warm and dry.   There is no lymphadenopathy about the head, neck, or axilla. He has a  regular rhythm with no significant murmur. His chest is clear to  auscultation.   There is mild tenderness to the mid-abdomen. He has no organomegaly or  masses.   Hemorrhoidal sacs are present.  Hemoccult testing is negative.   We will recommend that he goes on clear liquids,  sherbet, Lipton's  Chicken Noodle Soup, flat ginger ale. I would also recommend Omeprazole  every 12 hours for the gas  symptoms. Labs  and imaging will be pursued should there be a progression of symptoms  with these interventions.     Titus Dubin. Alwyn Ren, MD,FACP,FCCP  Electronically Signed    [facility]/MedQ  DD: 01/22/2007  DT: 01/23/2007  Job #: [id number]

## 2011-04-22 ENCOUNTER — Telehealth: Payer: Self-pay | Admitting: Cardiovascular Disease

## 2011-04-22 NOTE — Telephone Encounter (Signed)
Pt aware of appt with Tereso Newcomer 04/30/11.

## 2011-04-22 NOTE — Telephone Encounter (Signed)
Reviewed with Dr Excell Seltzer. Pt should have OV prior to surgery-OK to schedule with Tereso Newcomer. Appt made with Scott 04/30/11 at 3pm.

## 2011-04-22 NOTE — Telephone Encounter (Signed)
appt with Lorin Picket 04/30/11 at 3:30pm.

## 2011-04-22 NOTE — Telephone Encounter (Signed)
Pt to have bladder surgery on June 21.  He needs to be off Plavix for at least 10 days.  Is this ok for him to do or does he need to be seen.  He states he also needs an EKG.

## 2011-04-22 NOTE — Telephone Encounter (Signed)
I talked with pt. Pt scheduled for surgery for removal of bladder tumor 05/08/11. Asking if he needs OV with Dr Excell Seltzer prior to surgery. Pt last saw Dr Juanda Chance 12/11.

## 2011-04-22 NOTE — Telephone Encounter (Signed)
LMTCB on cell [phone]

## 2011-04-28 ENCOUNTER — Ambulatory Visit: Payer: Self-pay

## 2011-04-29 ENCOUNTER — Other Ambulatory Visit: Payer: Medicare Other

## 2011-04-29 LAB — CBC WITH DIFFERENTIAL/PLATELET
Basophils Absolute: 0 10*3/uL (ref 0.0–0.1)
Eosinophils Relative: 2.4 % (ref 0.0–5.0)
MCV: 97.8 fl (ref 78.0–100.0)
Monocytes Absolute: 0.4 10*3/uL (ref 0.1–1.0)
Monocytes Relative: 9.8 % (ref 3.0–12.0)
Neutrophils Relative %: 56.7 % (ref 43.0–77.0)
Platelets: 170 10*3/uL (ref 150.0–400.0)
RDW: 12.9 % (ref 11.5–14.6)
WBC: 4.1 10*3/uL — ABNORMAL LOW (ref 4.5–10.5)

## 2011-04-29 LAB — BASIC METABOLIC PANEL
BUN: 16 mg/dL (ref 6–23)
Creatinine, Ser: 1 mg/dL (ref 0.4–1.5)
GFR: 77.24 mL/min (ref >60.00)
Glucose, Bld: 104 mg/dL — ABNORMAL HIGH (ref 70–99)

## 2011-04-29 NOTE — Progress Notes (Signed)
Labs only

## 2011-04-30 ENCOUNTER — Encounter: Payer: Self-pay | Admitting: Physician Assistant

## 2011-04-30 ENCOUNTER — Ambulatory Visit (INDEPENDENT_AMBULATORY_CARE_PROVIDER_SITE_OTHER): Payer: Medicare Other | Admitting: Physician Assistant

## 2011-04-30 VITALS — BP 138/60 | HR 61 | Resp 18 | Ht 66.0 in | Wt 153.8 lb

## 2011-04-30 DIAGNOSIS — I2510 Atherosclerotic heart disease of native coronary artery without angina pectoris: Secondary | ICD-10-CM | POA: Insufficient documentation

## 2011-04-30 DIAGNOSIS — I10 Essential (primary) hypertension: Secondary | ICD-10-CM

## 2011-04-30 DIAGNOSIS — Z01810 Encounter for preprocedural cardiovascular examination: Secondary | ICD-10-CM

## 2011-04-30 DIAGNOSIS — E785 Hyperlipidemia, unspecified: Secondary | ICD-10-CM

## 2011-04-30 MED ORDER — NITROGLYCERIN 0.4 MG SL SUBL: 0.4000 mg | SUBLINGUAL_TABLET | SUBLINGUAL | Status: DC | PRN

## 2011-04-30 NOTE — Assessment & Plan Note (Signed)
Controlled.  Continue current therapy.  

## 2011-04-30 NOTE — Assessment & Plan Note (Signed)
No angina.  Continue ASA and restart Plavix post op ASAP.  Follow up with Dr. Excell Seltzer in December, or sooner PRN.

## 2011-04-30 NOTE — Assessment & Plan Note (Signed)
He is not having any unstable cardiac conditions.  He is in NSR and does not have a history of CHF.  He is a very active octogenarian.  He is definitely able to achieve more than 4 METS without angina.  He had a low risk Myoview one year ago.  According to ACC/AHA guidelines, he requires no further cardiac workup prior to his noncardiac procedure and should be at acceptable risk.  His heart rate is excellent without a beta blocker.  He has a first generation drug eluting stent.  He really should remain on ASA.  He can hold Plavix for his surgery.  But, he should resume his Plavix as soon as possible post operatively.

## 2011-04-30 NOTE — Progress Notes (Signed)
History of Present Illness: Primary Cardiologist: Dr. Tonny Bollman Urologist: Dr. Christene Lye [name] [name] is a 77 y.o. 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  He was previously followed by Dr. Juanda Chance.  He had unstable angina in 10/05 and was treated with 2 overlapping Cypher DES to the proximal LAD and one Cypher DES to the distal LAD.  He had Dx2 with 50% and no CAD in the CFX and RCA.  Myoview in 6/11: EF 60%, mild fixed inferior, inferoseptal, inferoapical thinning, no ischemia.  The study was felt to be low risk and he was cleared for surgery at that time.  He presents for surgical clearance today.   He needs another bladder tumor removed.  He denies angina.  He plays tennis 1-2 times a week and walks 1-2 times a week.  He also lifts weights.  He denies any recurrent chest pain.  He has some DOE.  This is mild and unchanged over the last year.  He denies orthopnea, PND, edema or syncope.  He has occasional indigestion.  He has been trying to taper off of PPI's.  He still takes ASA and Plavix.    Past Medical History  Diagnosis Date  . CAD (coronary artery disease)     a. s/p Cypher DES x 2 to prox LAD and Cypher DES x 1 to distal LAD 08/2009;  b. cath with residual D2 50% 10/05 and normal EF;  c. Myoview 6/11: EF 60%, mild fixed INF, inferoseptal, inferoapical thinning, no ischemia, low risk, EF 60%  . HTN (hypertension)   . HLD (hyperlipidemia)     h/o elevated LFTs on Zocor  . GERD (gastroesophageal reflux disease)   . Asthma   . Skin cancer, basal cell   . Nephrolithiasis   . Colon polyps   . Bladder cancer     Current Outpatient Prescriptions  Medication Sig Dispense Refill  . aspirin 81 MG tablet Take 81 mg by mouth daily.        . clopidogrel (PLAVIX) 75 MG tablet Take 1 tablet (75 mg total) by mouth daily.  30 tablet  6  . CRESTOR 10 MG tablet 5 mg. On M-W-F      . fexofenadine (ALLEGRA) 180 MG tablet Take 180  mg by mouth as needed.        . fluticasone (FLONASE) 50 MCG/ACT nasal spray       . Fluticasone-Salmeterol (ADVAIR DISKUS) 100-50 MCG/DOSE AEPB Inhale 1 puff into the lungs as needed.        Marland Kitchen glucosamine-chondroitin 500-400 MG tablet Take 1 tablet by mouth daily.        . nitroGLYCERIN (NITROSTAT) 0.4 MG SL tablet Place 1 tablet (0.4 mg total) under the tongue every 5 (five) minutes as needed.  25 tablet  3  . RAPAFLO 8 MG CAPS capsule Take 4 mg by mouth daily with breakfast.       . traMADol (ULTRAM) 50 MG tablet Take 50 mg by mouth every 6 (six) hours as needed.        Marland Kitchen VIAGRA 100 MG tablet TAKE ONE-HALF(1/2) TO ONE (1) TABLET(S) ONCE DAILY AS NEEDED  6 tablet  1  . DISCONTD: nitroGLYCERIN (NITROSTAT) 0.4 MG SL tablet Place 0.4 mg under the tongue every 5 (five) minutes as needed.        Marland Kitchen DISCONTD: sucralfate (CARAFATE) 1 G tablet         Allergies: Allergies  Allergen Reactions  . Hydromorphone Hcl   . Simvastatin   . Zocor (Simvastatin - High Dose)     Social Hx:   As above.  Non smoker.    ROS:  See HPI.  No fever, cough, melena, hematochezia.  All other systems reviewed and negative.  Vital Signs: BP 138/60  Pulse 61  Resp 18  Ht 5\' 6"  (1.676 m)  Wt 153 lb 12.8 oz (69.763 kg)  BMI 24.82 kg/m2  PHYSICAL EXAM: Well nourished, well developed, in no acute distress HEENT: normal Neck: no JVD Endo: no thyromegaly Carotids:  No bruits Cardiac:  normal S1, S2; RRR; no murmur Lungs:  clear to auscultation bilaterally, no wheezing, rhonchi or rales Abd: soft, nontender, no hepatomegaly Ext: no edema Skin: warm and dry Neuro:  CNs 2-12 intact, no focal abnormalities noted Psych:  Normal affect  EKG:  NSR, HR 61, NSSTTW changes, no change from previous tracing  ASSESSMENT AND PLAN:

## 2011-04-30 NOTE — Patient Instructions (Signed)
Your physician recommends that you schedule a follow-up appointment in: PREVIOUS PT OF DR. Charlies Constable AND NEEDS TO SEE DR. Excell Seltzer IN December 2012.

## 2011-04-30 NOTE — Assessment & Plan Note (Signed)
Managed by PCP

## 2011-05-16 ENCOUNTER — Other Ambulatory Visit: Payer: Self-pay | Admitting: Internal Medicine

## 2011-05-27 ENCOUNTER — Encounter: Payer: Self-pay | Admitting: Internal Medicine

## 2011-05-27 ENCOUNTER — Ambulatory Visit (INDEPENDENT_AMBULATORY_CARE_PROVIDER_SITE_OTHER): Payer: Medicare Other | Admitting: Internal Medicine

## 2011-05-27 VITALS — BP 140/72 | HR 58 | Temp 98.2°F | Resp 14 | Ht 66.75 in | Wt 152.6 lb

## 2011-05-27 DIAGNOSIS — E785 Hyperlipidemia, unspecified: Secondary | ICD-10-CM

## 2011-05-27 DIAGNOSIS — I2510 Atherosclerotic heart disease of native coronary artery without angina pectoris: Secondary | ICD-10-CM

## 2011-05-27 DIAGNOSIS — C679 Malignant neoplasm of bladder, unspecified: Secondary | ICD-10-CM

## 2011-05-27 DIAGNOSIS — Z860100 Personal history of colon polyps, unspecified: Secondary | ICD-10-CM

## 2011-05-27 DIAGNOSIS — M899 Disorder of bone, unspecified: Secondary | ICD-10-CM

## 2011-05-27 DIAGNOSIS — Z87442 Personal history of urinary calculi: Secondary | ICD-10-CM

## 2011-05-27 DIAGNOSIS — I10 Essential (primary) hypertension: Secondary | ICD-10-CM

## 2011-05-27 DIAGNOSIS — M8580 Other specified disorders of bone density and structure, unspecified site: Secondary | ICD-10-CM

## 2011-05-27 DIAGNOSIS — J45909 Unspecified asthma, uncomplicated: Secondary | ICD-10-CM

## 2011-05-27 DIAGNOSIS — R748 Abnormal levels of other serum enzymes: Secondary | ICD-10-CM

## 2011-05-27 DIAGNOSIS — M79609 Pain in unspecified limb: Secondary | ICD-10-CM

## 2011-05-27 DIAGNOSIS — M79641 Pain in right hand: Secondary | ICD-10-CM

## 2011-05-27 MED ORDER — SUCRALFATE 1 G PO TABS: 1.0000 g | ORAL_TABLET | Freq: Three times a day (TID) | ORAL | Status: DC

## 2011-05-27 NOTE — Progress Notes (Signed)
Subjective:    Patient ID: [name] [name], male    DOB: [birth year], 77 y.o.   MRN: [id number]  HPI Medicare Wellness Visit:  The following psychosocial & medical history were reviewed as required by Medicare.   Social history: caffeine: 2 teas/ day," few oz of chocolate" daily , alcohol:  1-2/ day ,  tobacco use : quit  1970  & exercise : tennis & gym 30-60 min 3-4 X/ week.   Home & personal  safety / fall risk: no issues, activities of daily living: limitations , seatbelt use : yes , and smoke alarm employment : yes .  Power of Attorney/Living Will status : in place  Vision ( as recorded per Nurse) & Hearing  evaluation :  Wall chart read @ 6 ft; whisper heard @ 6 ft. Orientation :oriented X 3 , memory & recall :normal, spelling or math testing: normal ,and mood & affect : normal . Depression / anxiety: denied Travel history : 2005 [place], immunization status :Pneumovaxneeded , transfusion history:  no, and preventive health surveillance ( colonoscopies, BMD , etc as per protocol/ SOC): done 2005 , small polyp found , Dental care:  Seen every 6 mos . Chart reviewed &  Updated. Active issues reviewed & addressed.       Review of Systems He describes some discomfort  Intermittently  in his lower extremities for 6-8 mos. This seemed to get better when he stopped his Crestor.  He was cleared for bladder tumor excision by a  [facility] recently. EKG was done. He was taken off his Plavix perioperatively  On June 30 he tripped and on his knees and right hand. He has residual pain in the right hand. Rx: ice & heat.  His asthma has been quiescent; he has  not used his rescue inhaler     Objective:   Physical Exam  Gen.: Healthy and well-nourished in appearance. Alert, appropriate and cooperative throughout exam. He appears younger than stated age. Head: Normocephalic without obvious abnormalities Eyes: No corneal or conjunctival inflammation noted. Pupils equal round reactive to  light and accommodation. Fundal exam is benign without hemorrhages, exudate, papilledema. Extraocular motion intact. Decreased light reflex Ears: External  ear exam reveals no significant lesions or deformities. Wax on L.  Nose: External nasal exam reveals no deformity or inflammation. Nasal mucosa are pink and moist. No lesions or exudates noted. Mouth: Oral mucosa and oropharynx reveal no lesions or exudates. Upper plate Neck: No deformities, masses, or tenderness noted. Range of motion &. Thyroid normal. Lungs: Normal respiratory effort; chest expands symmetrically. Lungs are clear to auscultation without rales, wheezes, or increased work of breathing. Heart: Normal rate and rhythm. Normal S1 and S2. No gallop, click, or rub. S4 w/o murmur. Abdomen: Bowel sounds normal; abdomen soft and nontender. No masses, organomegaly or hernias noted. Genitalia: Dr Lindley Magnus                                                                                    Musculoskeletal/extremities: No deformity or scoliosis noted of  the thoracic or lumbar spine. No clubbing, cyanosis, edema, or deformity noted. Range of motion  normal .Tone & strength  normal.Joints normal. Nail health  Good. Mild OA finger changes. Slight tenderness dorsum R hand to palpation & compression. Full ROM Vascular: Carotid, radial artery, dorsalis pedis and  posterior tibial pulses are full and equal. No bruits present. Neurologic: Alert and oriented x3. Deep tendon reflexes symmetrical and normal.          Skin: Intact without suspicious lesions or rashes. Lymph: No cervical, axillary lymphadenopathy present. Psych: Mood and affect are normal. Normally interactive                                                                                         Assessment & Plan:  #1 Medicare Wellness Exam; criteria met ; data entered #2 Problem List reviewed ; Assessment/ Recommendations made   #3  myalgias of the lower extremities, R/O statin  myopathy  #4 posttraumatic hand pain   Plan: see Orders

## 2011-05-27 NOTE — Patient Instructions (Addendum)
Please  schedule fasting Labs : [facility] panel (1304X)Preventive Health Care: Exercise at least 30-45 minutes a day,  3-4 days a week.  Eat a low-fat diet with lots of fruits and vegetables, up to 7-9 servings per day. Consume less than 40 grams of sugar per day from foods & drinks with High Fructose Corn Sugar as #1,2,3 or # 4 on label.  The triggers for dyspepsia or "heart burn"  include stress; the "aspirin family" ; alcohol; peppermint; and caffeine (coffee, tea, cola, and chocolate). The aspirin family would include aspirin and the nonsteroidal agents such as ibuprofen &  Naproxen. Tylenol would not cause reflux. If having dyspepsia ; food & drink should be avoided for @ least 2 hours before going to bed.

## 2011-05-29 ENCOUNTER — Ambulatory Visit (INDEPENDENT_AMBULATORY_CARE_PROVIDER_SITE_OTHER)
Admission: RE | Admit: 2011-05-29 | Discharge: 2011-05-29 | Disposition: A | Discharge status: Home / Self Care | Payer: Medicare Other | Source: Ambulatory Visit | Attending: Internal Medicine | Admitting: Internal Medicine

## 2011-05-29 DIAGNOSIS — M79641 Pain in right hand: Secondary | ICD-10-CM

## 2011-05-29 DIAGNOSIS — M79609 Pain in unspecified limb: Secondary | ICD-10-CM

## 2011-06-03 ENCOUNTER — Other Ambulatory Visit (INDEPENDENT_AMBULATORY_CARE_PROVIDER_SITE_OTHER): Payer: Medicare Other

## 2011-06-03 DIAGNOSIS — I10 Essential (primary) hypertension: Secondary | ICD-10-CM

## 2011-06-03 DIAGNOSIS — J45909 Unspecified asthma, uncomplicated: Secondary | ICD-10-CM

## 2011-06-03 DIAGNOSIS — R748 Abnormal levels of other serum enzymes: Secondary | ICD-10-CM

## 2011-06-03 DIAGNOSIS — E785 Hyperlipidemia, unspecified: Secondary | ICD-10-CM

## 2011-06-03 NOTE — Progress Notes (Signed)
[facility] lab  

## 2011-06-04 ENCOUNTER — Encounter: Payer: Self-pay | Admitting: Internal Medicine

## 2011-06-26 ENCOUNTER — Encounter: Payer: Self-pay | Admitting: Internal Medicine

## 2011-07-03 ENCOUNTER — Ambulatory Visit: Payer: Medicare Other | Admitting: Internal Medicine

## 2011-07-08 ENCOUNTER — Encounter: Payer: Self-pay | Admitting: Internal Medicine

## 2011-07-08 ENCOUNTER — Ambulatory Visit (INDEPENDENT_AMBULATORY_CARE_PROVIDER_SITE_OTHER): Payer: Medicare Other | Admitting: Internal Medicine

## 2011-07-08 DIAGNOSIS — I2510 Atherosclerotic heart disease of native coronary artery without angina pectoris: Secondary | ICD-10-CM

## 2011-07-08 DIAGNOSIS — M8580 Other specified disorders of bone density and structure, unspecified site: Secondary | ICD-10-CM

## 2011-07-08 DIAGNOSIS — M899 Disorder of bone, unspecified: Secondary | ICD-10-CM

## 2011-07-08 DIAGNOSIS — E785 Hyperlipidemia, unspecified: Secondary | ICD-10-CM

## 2011-07-08 DIAGNOSIS — I10 Essential (primary) hypertension: Secondary | ICD-10-CM

## 2011-07-08 DIAGNOSIS — M949 Disorder of cartilage, unspecified: Secondary | ICD-10-CM

## 2011-07-08 DIAGNOSIS — R946 Abnormal results of thyroid function studies: Secondary | ICD-10-CM

## 2011-07-08 MED ORDER — NIACIN ER (ANTIHYPERLIPIDEMIC) 500 MG PO TBCR: 500.0000 mg | EXTENDED_RELEASE_TABLET | Freq: Every day | ORAL | Status: DC

## 2011-07-08 NOTE — Patient Instructions (Signed)
Niacin is a B vitamin; it is a vasodilator ( opens  vessels). Because of this effect, it can cause flushing. To prevent this, take the niacin with applesauce. The pectin in the  applesauce should  prevent the flushing.

## 2011-07-08 NOTE — Progress Notes (Signed)
  Subjective:    Patient ID: Glenna Durand, male    DOB: [birth year], 77 y.o.   MRN: [id number]  HPI Dyslipidemia assessment: Prior Advanced Lipid Testing: no   Family history of premature CAD/ MI: MGM MI @ 46.  Nutrition: heart healthy .  Exercise: walking 2 mpd  3X/ week . Diabetes : A1c 5.2% . HTN: well controlled. Smoking history  : quit 1970 .   Weight :  stable.  Lab results reviewed :[facility] Panel:TSH 6.98;LDL 104;HDL 76;Lp(a) 86. Hepatic enzymes and CK are both normal. He is on Crestor 5 mg 3X/ week  Past medical history is significant for coronary disease; status post stenting x3. He remains on Plavix. He has a history of intolerance to high-dose simvastatin due to  elevated liver enzymes.    Review of Systems ROS: fatigue: some ; chest pain : no ;claudication: no; palpitations: no; abd pain/bowel changes: no ; myalgias:muscle cramps @ night, improved with stretching exercises;  syncope : no ; memory loss: no;skin changes: no.     Objective:   Physical Exam Gen.: Healthy and well-nourished in appearance. Alert, appropriate and cooperative throughout exam. Appears younger than Eyes: No corneal or conjunctival inflammation noted.  Extraocular motion intact. No lid lag  Ears: External  ear exam reveals no significant lesions or deformities. Canals clear .TMs normal. Hearing is grossly normal bilaterally. Nose: External nasal exam reveals no deformity or inflammation. Nasal mucosa are pink and moist. No lesions or exudates noted.  Mouth: Oral mucosa and oropharynx reveal no lesions or exudates. Teeth in good repair. Neck: No deformities, masses, or tenderness noted.  Thyroid normal. Lungs: Normal respiratory effort; chest expands symmetrically. Lungs are clear to auscultation without rales, wheezes, or increased work of breathing. Heart: Normal rate and rhythm. Normal S1 and S2. No gallop, click, or rub. Faint basilar  murmur. Abdomen: Bowel sounds normal; abdomen soft and  nontender. No masses, organomegaly or hernias noted. No AAA or bruits                                                                  Musculoskeletal/extremities:No clubbing, cyanosis, edema  noted.  Nail health  Good. Minor OAchanges Vascular: Carotid, radial artery, dorsalis pedis and  posterior tibial pulses are full and equal. No bruits present. Neurologic: Alert and oriented x3. Deep tendon reflexes symmetrical and normal.   No tremor        Skin: Intact without suspicious lesions or rashes. Psych: Mood and affect are normal. Normally interactive                                                                                         Assessment & Plan:

## 2011-07-24 ENCOUNTER — Other Ambulatory Visit: Payer: Self-pay | Admitting: Internal Medicine

## 2011-08-11 ENCOUNTER — Other Ambulatory Visit: Payer: Self-pay | Admitting: Internal Medicine

## 2011-08-29 ENCOUNTER — Other Ambulatory Visit: Payer: Self-pay | Admitting: Internal Medicine

## 2011-10-15 ENCOUNTER — Other Ambulatory Visit: Payer: Self-pay | Admitting: Internal Medicine

## 2011-10-28 ENCOUNTER — Ambulatory Visit (INDEPENDENT_AMBULATORY_CARE_PROVIDER_SITE_OTHER): Payer: Medicare Other | Admitting: Cardiovascular Disease

## 2011-10-28 ENCOUNTER — Encounter: Payer: Self-pay | Admitting: Cardiovascular Disease

## 2011-10-28 VITALS — BP 134/68 | HR 54 | Ht 66.0 in | Wt 153.0 lb

## 2011-10-28 DIAGNOSIS — I2510 Atherosclerotic heart disease of native coronary artery without angina pectoris: Secondary | ICD-10-CM

## 2011-10-28 DIAGNOSIS — E785 Hyperlipidemia, unspecified: Secondary | ICD-10-CM

## 2011-10-28 NOTE — Assessment & Plan Note (Signed)
I reviewed the patient's lipid panel in detail. He has had advanced lipid testing done and his results are favorable. He should continue on Crestor 10 mg daily. His HDL is very high, his LP (a) is elevated. He tried to take niacin but noted significant flushing even with the use of aspirin. Considering his stability now 7 years out from coronary stenting and overall good lipid panel, I recommended that he continue on his current program there

## 2011-10-28 NOTE — Assessment & Plan Note (Signed)
The patient is stable without anginal symptoms. He has had multiple stents placed in the LAD.  I recommended an exercise treadmill stress test for CAD surveillance. He should continue on dual antiplatelet therapy with aspirin and Plavix.

## 2011-10-28 NOTE — Patient Instructions (Signed)
Your physician has requested that you have an exercise tolerance test. For further information please visit [email or web address] Please also follow instruction sheet, as given.   Your physician wants you to follow-up in: 12 months You will receive a reminder letter in the mail two months in advance. If you don't receive a letter, please call our office to schedule the follow-up appointment.

## 2011-10-28 NOTE — Progress Notes (Signed)
HPI:  This is an 77 year old gentleman presenting for followup of coronary artery disease. The patient presented with unstable angina in 2005 and was treated with overlapping drug-eluting stents in the proximal LAD and a third stent in the distal LAD. He had no significant disease in his left circumflex or right coronary artery territories. His last stress test was in June 2011 and this demonstrated a mild fixed defect in the inferior wall and inferoseptum. There was no ischemia noted and the left ventricular ejection fraction was gated at 60%.  The patient feels well. He has occasional episodes of left-sided abdominal and chest pain that he relates to gastroesophageal reflux disease. He has had this for several years and is unchanged in intensity or frequency. The symptoms are non-radiating and are not associated with physical exertion. He generally feels numb after his evening meal. He is physically active and does regular exercise. He plays tennis and walks for exercise. He has no symptoms with exertion. He specifically denies chest pain or pressure. He denies palpitations, shortness of breath, or edema.  Outpatient Encounter Prescriptions as of 10/28/2011  Medication Sig Dispense Refill  . ADVAIR DISKUS 100-50 MCG/DOSE AEPB USE ONE (1) INHALATIONS TWICE DAILY  60 each  5  . aspirin 81 MG tablet Take 81 mg by mouth daily.        . clopidogrel (PLAVIX) 75 MG tablet Take 1 tablet (75 mg total) by mouth daily.  30 tablet  6  . CRESTOR 10 MG tablet TAKE ONE (1) TABLET(S) EVERY NIGHT AT BEDTIME  30 tablet  11  . fexofenadine (ALLEGRA) 180 MG tablet Take 180 mg by mouth as needed.       . fluticasone (FLONASE) 50 MCG/ACT nasal spray 2 sprays at bedtime.       Marland Kitchen glucosamine-chondroitin 500-400 MG tablet Take 3 tablets by mouth daily.       . nitroGLYCERIN (NITROSTAT) 0.4 MG SL tablet Place 1 tablet (0.4 mg total) under the tongue every 5 (five) minutes as needed.  25 tablet  3  . ranitidine (ZANTAC) 150 MG  tablet TAKE ONE (1) TABLET(S) TWICE DAILY  180 tablet  2  . RAPAFLO 8 MG CAPS capsule Take 4 mg by mouth daily with breakfast.       . sucralfate (CARAFATE) 1 G tablet Take 1 tablet (1 g total) by mouth 3 (three) times daily with meals. Tid  270 tablet  3  . traMADol (ULTRAM) 50 MG tablet Take 50 mg by mouth every 6 (six) hours as needed.        Marland Kitchen VIAGRA 100 MG tablet TAKE ONE-HALF(1/2) TO ONE (1) TABLET(S) ONCE DAILY AS NEEDED  6 tablet  2  . DISCONTD: niacin (NIASPAN) 500 MG CR tablet Take 1 tablet (500 mg total) by mouth at bedtime. Take with applesauce to prevent flushing  30 tablet  5    Allergies  Allergen Reactions  . Hydromorphone Hcl     angioedema   . Zocor (Simvastatin - High Dose)     Elevated hepatic enzymes    Past Medical History  Diagnosis Date  . CAD (coronary artery disease)     a. s/p Cypher DES x 2 to prox LAD and Cypher DES x 1 to distal LAD 08/2009;  b. cath with residual D2 50% 10/05 and normal EF;  c. Myoview 6/11: EF 60%, mild fixed INF, inferoseptal, inferoapical thinning, no ischemia, low risk, EF 60%  . HTN (hypertension)   . HLD (hyperlipidemia)  h/o elevated LFTs on Zocor  . GERD (gastroesophageal reflux disease)   . Asthma   . Skin cancer, basal cell   . Nephrolithiasis   . Colon polyps   . Bladder cancer     ROS: Negative except as per HPI  BP 134/68  Pulse 54  Ht 5\' 6"  (1.676 m)  Wt 69.4 kg (153 lb)  BMI 24.69 kg/m2  PHYSICAL EXAM: Pt is alert and oriented, physically fit-appearing elderly male in NAD HEENT: normal Neck: JVP - normal, carotids 2+= without bruits Lungs: CTA bilaterally CV: RRR without murmur or gallop Abd: soft, NT, Positive BS, no hepatomegaly Ext: no C/C/E, distal pulses intact and equal Skin: warm/dry no rash  EKG:  Sinus bradycardia 54 beats per minute, otherwise within normal limits.  ASSESSMENT AND PLAN:

## 2011-11-27 ENCOUNTER — Other Ambulatory Visit: Payer: Self-pay | Admitting: Cardiovascular Disease

## 2011-11-27 NOTE — Telephone Encounter (Signed)
..   Requested Prescriptions   Pending Prescriptions Disp Refills  . clopidogrel (PLAVIX) 75 MG tablet [Pharmacy Med Name: CLOPIDOGREL 75 MG TAB AURO] 30 tablet 5    Sig: TAKE ONE TABLET BY MOUTH ONE TIME DAILY

## 2011-12-04 DIAGNOSIS — N401 Enlarged prostate with lower urinary tract symptoms: Secondary | ICD-10-CM | POA: Diagnosis not present

## 2011-12-04 DIAGNOSIS — C679 Malignant neoplasm of bladder, unspecified: Secondary | ICD-10-CM | POA: Diagnosis not present

## 2011-12-04 DIAGNOSIS — N329 Bladder disorder, unspecified: Secondary | ICD-10-CM | POA: Diagnosis not present

## 2011-12-11 ENCOUNTER — Ambulatory Visit (INDEPENDENT_AMBULATORY_CARE_PROVIDER_SITE_OTHER): Payer: Medicare Other | Admitting: Cardiovascular Disease

## 2011-12-11 DIAGNOSIS — I2510 Atherosclerotic heart disease of native coronary artery without angina pectoris: Secondary | ICD-10-CM | POA: Diagnosis not present

## 2011-12-11 NOTE — Progress Notes (Signed)
Exercise Treadmill Test  Pre-Exercise Testing Evaluation Rhythm: normal sinus  Rate: 68   PR:  20 QRS:  .09  QT:  .38 QTc: .41     Test  Exercise Tolerance Test Ordering MD: Tonny Bollman, MD  Interpreting MD:  Tonny Bollman, MD  Unique Test No: 1  Treadmill:  1  Indication for ETT: known ASHD  Contraindication to ETT: No   Stress Modality: exercise - treadmill  Cardiac Imaging Performed: non   Protocol: standard Bruce - maximal  Max BP: 205/86  Max MPHR (bpm):  140 85% MPR (bpm):  119  MPHR obtained (bpm):  129 % MPHR obtained:  92%  Reached 85% MPHR (min:sec):  6:18 Total Exercise Time (min-sec):  8:00  Workload in METS:  10.0 Borg Scale: 13  Reason ETT Terminated:  desired heart rate attained    ST Segment Analysis At Rest: normal ST segments - no evidence of significant ST depression With Exercise: significant ischemic ST depression  Other Information Arrhythmia:  No Angina during ETT:  absent (0) Quality of ETT:  diagnostic  ETT Interpretation:  abnormal - evidence of ST depression consistent with ischemia  Comments: Good exercise tolerance for age. The EKG is positive for ischemia but unchanged compared to 2011 stress test. The ST segments normalize rapidly in recovery (within 45 seconds). There is no angina with exertion.  Recommendations: Continue medical therapy for CAD and follow-up in 6 months. While EKG response was positive, I feel that overall study result was low-risk.

## 2012-01-07 DIAGNOSIS — D303 Benign neoplasm of bladder: Secondary | ICD-10-CM | POA: Diagnosis not present

## 2012-01-07 DIAGNOSIS — C679 Malignant neoplasm of bladder, unspecified: Secondary | ICD-10-CM | POA: Diagnosis not present

## 2012-01-07 DIAGNOSIS — N329 Bladder disorder, unspecified: Secondary | ICD-10-CM | POA: Diagnosis not present

## 2012-01-30 DIAGNOSIS — H6120 Impacted cerumen, unspecified ear: Secondary | ICD-10-CM | POA: Diagnosis not present

## 2012-03-18 DIAGNOSIS — R040 Epistaxis: Secondary | ICD-10-CM | POA: Diagnosis not present

## 2012-04-01 ENCOUNTER — Other Ambulatory Visit: Payer: Self-pay | Admitting: Internal Medicine

## 2012-04-07 DIAGNOSIS — N401 Enlarged prostate with lower urinary tract symptoms: Secondary | ICD-10-CM | POA: Diagnosis not present

## 2012-04-07 DIAGNOSIS — C679 Malignant neoplasm of bladder, unspecified: Secondary | ICD-10-CM | POA: Diagnosis not present

## 2012-04-07 DIAGNOSIS — Z8551 Personal history of malignant neoplasm of bladder: Secondary | ICD-10-CM | POA: Diagnosis not present

## 2012-05-10 DIAGNOSIS — H6120 Impacted cerumen, unspecified ear: Secondary | ICD-10-CM | POA: Diagnosis not present

## 2012-05-28 ENCOUNTER — Ambulatory Visit (INDEPENDENT_AMBULATORY_CARE_PROVIDER_SITE_OTHER): Payer: Medicare Other | Admitting: Internal Medicine

## 2012-05-28 ENCOUNTER — Encounter: Payer: Self-pay | Admitting: Internal Medicine

## 2012-05-28 VITALS — BP 138/66 | HR 65 | Temp 97.7°F | Resp 12 | Ht 66.08 in | Wt 151.6 lb

## 2012-05-28 DIAGNOSIS — E785 Hyperlipidemia, unspecified: Secondary | ICD-10-CM | POA: Diagnosis not present

## 2012-05-28 DIAGNOSIS — M8580 Other specified disorders of bone density and structure, unspecified site: Secondary | ICD-10-CM

## 2012-05-28 DIAGNOSIS — M899 Disorder of bone, unspecified: Secondary | ICD-10-CM

## 2012-05-28 DIAGNOSIS — M949 Disorder of cartilage, unspecified: Secondary | ICD-10-CM

## 2012-05-28 DIAGNOSIS — Z860100 Personal history of colon polyps, unspecified: Secondary | ICD-10-CM

## 2012-05-28 DIAGNOSIS — Z0000 Encounter for general adult medical examination without abnormal findings: Secondary | ICD-10-CM

## 2012-05-28 DIAGNOSIS — R030 Elevated blood-pressure reading, without diagnosis of hypertension: Secondary | ICD-10-CM

## 2012-05-28 DIAGNOSIS — C679 Malignant neoplasm of bladder, unspecified: Secondary | ICD-10-CM | POA: Diagnosis not present

## 2012-05-28 NOTE — Progress Notes (Signed)
Subjective:    Patient ID: Glenna Durand, male    DOB: [birth year], 78 y.o.   MRN: [id number]  HPI Medicare Wellness Visit:  The following psychosocial & medical history were reviewed as required by Medicare.   Social history: caffeine: 1 cup tea/ day , alcohol:  10-14 wine / week ,  tobacco use : quit 1962  & exercise : tennis 5-6 hrs / week; treadmill.   Home & personal  safety / fall risk: no issues, activities of daily living: no limitations , seatbelt use : yes , and smoke alarm employment : yes.  Power of Attorney/Living Will status : in place  Vision ( as recorded per Nurse) & Hearing  evaluation :  [facility] Ophth appt pending next week. Hearing evaluated in 2012 . Orientation :oriented X 3 , memory & recall :good,  math testing: good,and mood & affect : normal . Depression / anxiety: denied Travel history : 2003 [place] , immunization status :? Tetanus due , transfusion history:  no, and preventive health surveillance ( colonoscopies, BMD , etc as per protocol/ SOC): ? Colonoscopy due, Dental care:  Seen every 6 mos . Chart reviewed &  Updated. Active issues reviewed & addressed.       Review of Systems  PMH of elevated BP w/o HYPERTENSION: His blood pressure was never elevated prior to his stents. He questions a component of white coat syndrome Disease Monitoring: Blood pressure range-not monitored  Chest pain, palpitations-  he describes intermittent dull, nonexertional chest pain left anterior axillary line greater than left anterior axillary line. He believes this is positionally related while driving or with stress. "Gas" pills help . He does not have this on the treadmill or playing tennis  Dyspnea- no Medications: Compliance- no meds  Lightheadedness,Syncope- occasional lightheadedness if sleep deprived   Edema- no HYPERLIPIDEMIA: Disease Monitoring: See symptoms for Hypertension Medications: Compliance- yes Abd pain, bowel changes- no   Muscle aches-in calves  Fasting  blood sugar was 104 and 2012. He denies polyuria, polyphagia, or polydipsia beyond that related to increased fluid intake and prostate issues.  [place]; he is on generic Plavix. The fall was related to ambulation at night while not fully awake. There was no neuro or cardiac prodrome      Objective:   Physical Exam Gen.: Healthy and well-nourished in appearance. Alert, appropriate and cooperative throughout exam.Appears younger than stated age  Head: Normocephalic without obvious abnormalities Eyes: No corneal or conjunctival inflammation noted. Pupils equal round reactive to light and accommodation.  Extraocular motion intact. Vision grossly normal. Ears: External  ear exam reveals no significant lesions or deformities. Canals clear .TMs normal. Hearing is minimally decreased on L. Nose: External nasal exam reveals no deformity or inflammation. Nasal mucosa are pink and moist. No lesions or exudates noted.  Mouth: Oral mucosa and oropharynx reveal no lesions or exudates. Lower teeth in good repair;upper plate. Neck: No deformities, masses, or tenderness noted. Range of motion &  Thyroid normal. Lungs: Normal respiratory effort; chest expands symmetrically. Lungs are clear to auscultation without rales, wheezes, or increased work of breathing. Heart: Normal rate and rhythm. Normal S1 and S2. No gallop, click, or rub. S4 w/o murmur. Abdomen: Bowel sounds normal; abdomen soft and nontender. No masses, organomegaly or hernias noted. Genitalia: Dr Lindley Magnus  Musculoskeletal/extremities: No deformity or scoliosis noted of  the thoracic or lumbar spine. No clubbing, cyanosis, edema, or deformity noted. Range of motion  normal .Tone & strength  normal. Nail health  good. Mixed arthritic changes of the hands are present Vascular: Carotid, radial artery, dorsalis pedis and  posterior tibial pulses  are full and equal. No bruits present. Neurologic: Alert and oriented x3. Deep tendon reflexes symmetrical and normal.          Skin: Intact without suspicious lesions or rashes. Ecchymosis and posttraumatic hematoma right upper extremity Lymph: No cervical, axillary lymphadenopathy present. Psych: Mood and affect are normal. Normally interactive                                                                                         Assessment & Plan:  #1 Medicare Wellness Exam; criteria met ; data entered #2 Problem List reviewed ; Assessment/ Recommendations made Plan: see Orders

## 2012-05-28 NOTE — Patient Instructions (Addendum)
Preventive Health Care: Exercise at least 30-45 minutes a day,  3-4 days a week.  Eat a low-fat diet with lots of fruits and vegetables, up to 7-9 servings per day.  Consume less than 40 grams of sugar per day from foods & drinks with High Fructose Corn Sugar as #1,2,3 or # 4 on label. Eye Doctor - have an eye exam @ least annually.                                                          Please  schedule fasting Labs : BMET,Lipids, hepatic panel, CBC & dif, TSH, vit D level;CK. PLEASE BRING THESE INSTRUCTIONS TO FOLLOW UP  LAB APPOINTMENT.This will guarantee correct labs are drawn, eliminating need for repeat blood sampling ( needle sticks ! ). Diagnoses /Codes:272.4, myalgia;995.20;733.90 Please try to go on My Chart within the next 24 hours to allow me to release the results directly to you.

## 2012-06-01 ENCOUNTER — Other Ambulatory Visit: Payer: Medicare Other

## 2012-06-02 ENCOUNTER — Other Ambulatory Visit (INDEPENDENT_AMBULATORY_CARE_PROVIDER_SITE_OTHER): Payer: Medicare Other

## 2012-06-02 DIAGNOSIS — M899 Disorder of bone, unspecified: Secondary | ICD-10-CM | POA: Diagnosis not present

## 2012-06-02 DIAGNOSIS — E785 Hyperlipidemia, unspecified: Secondary | ICD-10-CM | POA: Diagnosis not present

## 2012-06-02 DIAGNOSIS — M949 Disorder of cartilage, unspecified: Secondary | ICD-10-CM | POA: Diagnosis not present

## 2012-06-02 DIAGNOSIS — T887XXA Unspecified adverse effect of drug or medicament, initial encounter: Secondary | ICD-10-CM | POA: Diagnosis not present

## 2012-06-02 LAB — HEPATIC FUNCTION PANEL
ALT: 17 U/L (ref 0–53)
AST: 18 U/L (ref 0–37)
Total Bilirubin: 0.9 mg/dL (ref 0.3–1.2)
Total Protein: 6.5 g/dL (ref 6.0–8.3)

## 2012-06-02 LAB — CBC WITH DIFFERENTIAL/PLATELET
Eosinophils Absolute: 0.1 10*3/uL (ref 0.0–0.7)
Eosinophils Relative: 2.1 % (ref 0.0–5.0)
MCHC: 34 g/dL (ref 30.0–36.0)
MCV: 97.3 fl (ref 78.0–100.0)
Monocytes Absolute: 0.4 10*3/uL (ref 0.1–1.0)
Neutrophils Relative %: 54.5 % (ref 43.0–77.0)
Platelets: 159 10*3/uL (ref 150.0–400.0)
WBC: 4.3 10*3/uL — ABNORMAL LOW (ref 4.5–10.5)

## 2012-06-02 LAB — BASIC METABOLIC PANEL
Calcium: 8.8 mg/dL (ref 8.4–10.5)
GFR: 78.87 mL/min (ref >60.00)
Glucose, Bld: 92 mg/dL (ref 70–99)
Sodium: 141 mEq/L (ref 135–145)

## 2012-06-02 LAB — CK: Total CK: 54 U/L (ref 7–232)

## 2012-06-02 LAB — LIPID PANEL
Cholesterol: 158 mg/dL (ref 0–200)
HDL: 79.6 mg/dL (ref >39.00)

## 2012-06-02 LAB — TSH: TSH: 7.07 u[IU]/mL — ABNORMAL HIGH (ref 0.35–5.50)

## 2012-06-02 NOTE — Progress Notes (Signed)
Labs only

## 2012-06-09 ENCOUNTER — Other Ambulatory Visit (INDEPENDENT_AMBULATORY_CARE_PROVIDER_SITE_OTHER): Payer: Medicare Other

## 2012-06-09 ENCOUNTER — Other Ambulatory Visit: Payer: Self-pay | Admitting: Cardiovascular Disease

## 2012-06-09 ENCOUNTER — Ambulatory Visit: Payer: Medicare Other | Admitting: Cardiovascular Disease

## 2012-06-09 DIAGNOSIS — R946 Abnormal results of thyroid function studies: Secondary | ICD-10-CM | POA: Diagnosis not present

## 2012-06-09 LAB — T3, FREE: T3, Free: 2.3 pg/mL (ref 2.3–4.2)

## 2012-06-09 LAB — T4, FREE: Free T4: 0.75 ng/dL (ref 0.60–1.60)

## 2012-06-09 NOTE — Progress Notes (Signed)
No charge. 

## 2012-06-14 ENCOUNTER — Other Ambulatory Visit: Payer: Self-pay | Admitting: Internal Medicine

## 2012-06-16 ENCOUNTER — Encounter: Payer: Self-pay | Admitting: Cardiovascular Disease

## 2012-06-16 ENCOUNTER — Ambulatory Visit (INDEPENDENT_AMBULATORY_CARE_PROVIDER_SITE_OTHER): Payer: Medicare Other | Admitting: Cardiovascular Disease

## 2012-06-16 VITALS — BP 150/75 | HR 52 | Ht 66.0 in | Wt 150.0 lb

## 2012-06-16 DIAGNOSIS — I10 Essential (primary) hypertension: Secondary | ICD-10-CM

## 2012-06-16 DIAGNOSIS — I2510 Atherosclerotic heart disease of native coronary artery without angina pectoris: Secondary | ICD-10-CM

## 2012-06-16 DIAGNOSIS — E785 Hyperlipidemia, unspecified: Secondary | ICD-10-CM

## 2012-06-16 HISTORY — DX: Essential (primary) hypertension: I10

## 2012-06-16 MED ORDER — ROSUVASTATIN CALCIUM 5 MG PO TABS: ORAL_TABLET | ORAL | Status: DC

## 2012-06-16 MED ORDER — AMLODIPINE BESYLATE 5 MG PO TABS: 5.0000 mg | ORAL_TABLET | Freq: Every day | ORAL | Status: DC

## 2012-06-16 NOTE — Assessment & Plan Note (Signed)
Blood pressure is consistently elevated but only mildly so. There is no room for improvement in lifestyle, so I think we should give him a trial of antihypertensive therapy. Recommend amlodipine 5 mg daily. I reviewed the potential side effects with the patient, most commonly leg swelling.

## 2012-06-16 NOTE — Patient Instructions (Addendum)
Your physician has recommended you make the following change in your medication: START Amlodipine 5mg  take one by mouth daily, DECREASE Crestor to 5mg  on Monday, Wednesday and Friday  Your physician has requested that you have an exercise tolerance test in January 2014 with Dr Excell Seltzer. For further information please visit [email or web address] Please also follow instruction sheet, as given.

## 2012-06-16 NOTE — Progress Notes (Signed)
HPI:  This is an 78 year old gentleman presenting for followup of coronary artery disease. The patient presented with unstable angina in 2005 and was treated with overlapping drug-eluting stents in the proximal LAD and a third stent in the distal LAD. He had no significant disease in his left circumflex or right coronary artery territories. His last stress test was in June 2011 and this demonstrated a mild fixed defect in the inferior wall and inferoseptum. There was no ischemia noted and the left ventricular ejection fraction was gated at 60%. The patient had an exercise treadmill study in January 2013. He exercised for 8 minutes according to the Bruce protocol, achieving 10 metabolic equivalents. He had no chest pain or tightness. His EKG response was positive but resolved immediately in recovery and was unchanged from his previous stress test. Because of overall low risk findings he was managed medically.  He continues to do well. He played tennis yesterday and had no symptoms with exertion. He has had long-standing sharp pains in the left chest that have never changed over many years. These have always occurred at rest and they are fleeting in nature. He has no exertional chest pain or dyspnea. His energy level is marginal. He remains quite active and continues to work. He's had no palpitations, lightheadedness, or syncope. He has tolerated long-term dual antiplatelet therapy and reports no bleeding problems. He goes to the drugstore and checks blood pressure periodically and notes that his blood pressure readings have been in the 140s over 60s.  Outpatient Encounter Prescriptions as of 06/16/2012  Medication Sig Dispense Refill  . aspirin 81 MG tablet Take 81 mg by mouth daily.        . clopidogrel (PLAVIX) 75 MG tablet TAKE ONE TABLET BY MOUTH ONE TIME DAILY  30 tablet  11  . fexofenadine (ALLEGRA) 180 MG tablet       . fluticasone (FLONASE) 50 MCG/ACT nasal spray 2 sprays at bedtime.       .  Fluticasone-Salmeterol (ADVAIR DISKUS) 100-50 MCG/DOSE AEPB       . glucosamine-chondroitin 500-400 MG tablet Take 2 tablets by mouth daily.       . nitroGLYCERIN (NITROSTAT) 0.4 MG SL tablet Place 1 tablet (0.4 mg total) under the tongue every 5 (five) minutes as needed.  25 tablet  3  . ranitidine (ZANTAC) 150 MG tablet TAKE ONE (1) TABLET(S) TWICE DAILY  180 tablet  1  . RAPAFLO 8 MG CAPS capsule Take 4 mg by mouth daily with breakfast.       . rosuvastatin (CRESTOR) 10 MG tablet 1 tab on mon- wed- fri      . sucralfate (CARAFATE) 1 G tablet Take 1 tablet (1 g total) by mouth 3 (three) times daily with meals. Tid  270 tablet  3  . traMADol (ULTRAM) 50 MG tablet Take 50 mg by mouth every 6 (six) hours as needed.        Marland Kitchen VIAGRA 100 MG tablet TAKE ONE-HALF(1/2) TO ONE (1) TABLET(S) ONCE DAILY AS NEEDED  6 tablet  2    Allergies  Allergen Reactions  . Hydromorphone Hcl     angioedema   . Zocor (Simvastatin - High Dose)     Elevated hepatic enzymes    Past Medical History  Diagnosis Date  . CAD (coronary artery disease)     a. s/p Cypher DES x 2 to prox LAD and Cypher DES x 1 to distal LAD 08/2009;  b. cath with residual D2 50%  10/05 and normal EF;  c. Myoview 6/11: EF 60%, mild fixed INF, inferoseptal, inferoapical thinning, no ischemia, low risk, EF 60%  . HLD (hyperlipidemia)     h/o elevated LFTs on Zocor  . GERD (gastroesophageal reflux disease)   . Asthma     post infectious  . Skin cancer, basal cell     Dr Londell Moh  . Nephrolithiasis   . Colon polyps   . Bladder cancer   . Other abnormal glucose 2012    FBS 104    ROS: Positive for bilateral leg pain unrelated to exertion, otherwise Negative except as per HPI  BP 150/75  Pulse 52  Ht 5\' 6"  (1.676 m)  Wt 150 lb (68.04 kg)  BMI 24.21 kg/m2  PHYSICAL EXAM: Pt is alert and oriented, pleasant elderly male in NAD HEENT: normal Neck: JVP - normal, carotids 2+= without bruits Lungs: CTA bilaterally CV: RRR without  murmur or gallop Abd: soft, NT, Positive BS, no hepatomegaly Ext: no C/C/E, distal pulses intact and equal Skin: warm/dry no rash  EKG:  Sinus bradycardia 55 beats per minute, otherwise within normal limits   ASSESSMENT AND PLAN:

## 2012-06-16 NOTE — Assessment & Plan Note (Signed)
Lipid panel is excellent. He has some mild leg myalgias. Will reduce Crestor 5 mg daily.

## 2012-06-16 NOTE — Assessment & Plan Note (Signed)
CAD without anginal symptoms. He will remain on dual antiplatelet therapy with aspirin and Plavix considering his multiple first-generation stents. I would like him to have an exercise treadmill in January since he was asymptomatic at initial presentation.

## 2012-06-30 DIAGNOSIS — H538 Other visual disturbances: Secondary | ICD-10-CM | POA: Diagnosis not present

## 2012-06-30 DIAGNOSIS — Z961 Presence of intraocular lens: Secondary | ICD-10-CM | POA: Diagnosis not present

## 2012-07-07 ENCOUNTER — Other Ambulatory Visit: Payer: Self-pay | Admitting: Internal Medicine

## 2012-07-07 MED ORDER — SUCRALFATE 1 G PO TABS: 1.0000 g | ORAL_TABLET | Freq: Three times a day (TID) | ORAL | Status: DC

## 2012-07-07 NOTE — Telephone Encounter (Signed)
SUCRALFATE ORAL TABLET 1 GM QTY:270 TAKE ONE TABLET BY MOUTH THREE TIMES DAILY WITH MEALS.

## 2012-07-21 DIAGNOSIS — C679 Malignant neoplasm of bladder, unspecified: Secondary | ICD-10-CM | POA: Diagnosis not present

## 2012-07-21 DIAGNOSIS — N329 Bladder disorder, unspecified: Secondary | ICD-10-CM | POA: Diagnosis not present

## 2012-07-21 DIAGNOSIS — N401 Enlarged prostate with lower urinary tract symptoms: Secondary | ICD-10-CM | POA: Diagnosis not present

## 2012-09-09 ENCOUNTER — Telehealth: Payer: Self-pay | Admitting: Cardiovascular Disease

## 2012-09-09 DIAGNOSIS — C679 Malignant neoplasm of bladder, unspecified: Secondary | ICD-10-CM | POA: Diagnosis not present

## 2012-09-09 DIAGNOSIS — N139 Obstructive and reflux uropathy, unspecified: Secondary | ICD-10-CM | POA: Diagnosis not present

## 2012-09-09 DIAGNOSIS — N401 Enlarged prostate with lower urinary tract symptoms: Secondary | ICD-10-CM | POA: Diagnosis not present

## 2012-09-09 DIAGNOSIS — N329 Bladder disorder, unspecified: Secondary | ICD-10-CM | POA: Diagnosis not present

## 2012-09-09 NOTE — Telephone Encounter (Signed)
Left message to call back  

## 2012-09-09 NOTE — Telephone Encounter (Signed)
I spoke with [name] [name] and the pt is scheduled on 09/29/12 for [name] surgery (laser for BPH).  The pt can continue ASA through out procedure but needs approval to hold plavix 5 days prior.  Clearance should be faxed to [phone].  I will forward this message to Dr Excell Seltzer for review.

## 2012-09-09 NOTE — Telephone Encounter (Signed)
Left message for Jane to call back.

## 2012-09-09 NOTE — Telephone Encounter (Signed)
F/u  [name] [name], returning call back to nurse

## 2012-09-09 NOTE — Telephone Encounter (Signed)
plz return call to [facility]- Dr. Lindley Magnus [place]  [facility] [phone] ext [phone],   Dr. Laury Axon ok to hold plavix 5 days prior to surgery, patient last seen by Excell Seltzer 7/13

## 2012-09-10 NOTE — Telephone Encounter (Signed)
This note was faxed to [facility].

## 2012-09-10 NOTE — Telephone Encounter (Signed)
Chart reviewed - stents several yrs ago (2005). Low-risk treadmill within past 12 months. He is at low-risk of holding plavix for 5 days before urologic procedure. Should start back when ok with urology after procedure.  [name] [name] 09/10/2012 10:27 AM

## 2012-09-13 DIAGNOSIS — H6120 Impacted cerumen, unspecified ear: Secondary | ICD-10-CM | POA: Diagnosis not present

## 2012-09-29 ENCOUNTER — Ambulatory Visit: Payer: Medicare Other

## 2012-09-30 ENCOUNTER — Ambulatory Visit (INDEPENDENT_AMBULATORY_CARE_PROVIDER_SITE_OTHER): Payer: Medicare Other

## 2012-09-30 DIAGNOSIS — Z23 Encounter for immunization: Secondary | ICD-10-CM

## 2012-10-18 ENCOUNTER — Ambulatory Visit: Payer: Medicare Other

## 2012-10-18 DIAGNOSIS — N139 Obstructive and reflux uropathy, unspecified: Secondary | ICD-10-CM | POA: Diagnosis not present

## 2012-10-18 DIAGNOSIS — C679 Malignant neoplasm of bladder, unspecified: Secondary | ICD-10-CM | POA: Diagnosis not present

## 2012-10-18 DIAGNOSIS — N401 Enlarged prostate with lower urinary tract symptoms: Secondary | ICD-10-CM | POA: Diagnosis not present

## 2012-10-21 DIAGNOSIS — N329 Bladder disorder, unspecified: Secondary | ICD-10-CM | POA: Diagnosis not present

## 2012-10-21 DIAGNOSIS — C679 Malignant neoplasm of bladder, unspecified: Secondary | ICD-10-CM | POA: Diagnosis not present

## 2012-10-21 DIAGNOSIS — N401 Enlarged prostate with lower urinary tract symptoms: Secondary | ICD-10-CM | POA: Diagnosis not present

## 2012-11-01 ENCOUNTER — Ambulatory Visit (INDEPENDENT_AMBULATORY_CARE_PROVIDER_SITE_OTHER): Payer: Medicare Other

## 2012-11-01 DIAGNOSIS — Z23 Encounter for immunization: Secondary | ICD-10-CM

## 2012-11-05 DIAGNOSIS — N401 Enlarged prostate with lower urinary tract symptoms: Secondary | ICD-10-CM | POA: Diagnosis not present

## 2012-11-05 DIAGNOSIS — C679 Malignant neoplasm of bladder, unspecified: Secondary | ICD-10-CM | POA: Diagnosis not present

## 2012-11-05 DIAGNOSIS — N139 Obstructive and reflux uropathy, unspecified: Secondary | ICD-10-CM | POA: Diagnosis not present

## 2012-11-23 DIAGNOSIS — N139 Obstructive and reflux uropathy, unspecified: Secondary | ICD-10-CM | POA: Diagnosis not present

## 2012-11-23 DIAGNOSIS — N401 Enlarged prostate with lower urinary tract symptoms: Secondary | ICD-10-CM | POA: Diagnosis not present

## 2012-11-29 ENCOUNTER — Telehealth: Payer: Self-pay | Admitting: Internal Medicine

## 2012-11-29 NOTE — Telephone Encounter (Signed)
Patient states he has hip pain and would like to know if he should come in or if he needs a referral. He thinks it popped out of joint.

## 2012-11-29 NOTE — Telephone Encounter (Signed)
I called patient and spoke with his receptionist, patient scheduled for Wed 12/02/11 @ 4:00 pm, patient to call and reschedule if date/time not fitting for his schedule

## 2012-12-01 ENCOUNTER — Encounter: Payer: Self-pay | Admitting: Internal Medicine

## 2012-12-01 ENCOUNTER — Ambulatory Visit (INDEPENDENT_AMBULATORY_CARE_PROVIDER_SITE_OTHER): Payer: Medicare Other | Admitting: Internal Medicine

## 2012-12-01 VITALS — BP 116/60 | HR 66 | Temp 97.9°F | Wt 154.0 lb

## 2012-12-01 DIAGNOSIS — M25559 Pain in unspecified hip: Secondary | ICD-10-CM

## 2012-12-01 DIAGNOSIS — M25551 Pain in right hip: Secondary | ICD-10-CM

## 2012-12-01 DIAGNOSIS — M76899 Other specified enthesopathies of unspecified lower limb, excluding foot: Secondary | ICD-10-CM | POA: Diagnosis not present

## 2012-12-01 DIAGNOSIS — R252 Cramp and spasm: Secondary | ICD-10-CM

## 2012-12-01 DIAGNOSIS — M7071 Other bursitis of hip, right hip: Secondary | ICD-10-CM

## 2012-12-01 MED ORDER — CLONAZEPAM 0.5 MG PO TABS: ORAL_TABLET | ORAL | Status: DC

## 2012-12-01 MED ORDER — CELECOXIB 200 MG PO CAPS: 200.0000 mg | ORAL_CAPSULE | Freq: Two times a day (BID) | ORAL | Status: DC

## 2012-12-01 NOTE — Patient Instructions (Addendum)
Use an anti-inflammatory cream such as Aspercreme or Zostrix cream twice a day to the hip area as needed. In lieu of this warm moist compresses or  hot water bottle can be used. Do not apply ice .  To prevent muscle cramps take Mag/Cal or Cal/Mag  at bedtime.

## 2012-12-01 NOTE — Progress Notes (Signed)
  Subjective:    Patient ID: [name] [name], male    DOB: Nov 09, 1929, 79 y.o.   MRN: [id number]  HPI Extremity pain Location: R hip Onset: originally 15 years ago but resolved with Glucosamine; exacerbation 11/29/12 Trigger/injury:reaching awkwardly into tub Pain quality:severe initially Pain severity: up to 10; subsequently up to 5  Duration:until he was able to stand up 1/13 Radiation:no Exacerbating factors: reaching or jeking Treatment/response:heat helps    Review of Systems Constitutional: no fever, chills, sweats, change in weight  Musculoskeletal:unrelated  muscle cramps or pain in feet & hands; no  joint stiffness, redness, or swelling Skin:no rash, color/temp change Neuro: no weakness; incontinence (stool/urine); unrelated numbness and tingling  feet , ankles & fingers Heme:no lymphadenopathy; abnormal bruising or bleeding         Objective:   Physical Exam Gen.:thin but healthy and well-nourished in appearance. Alert, appropriate and cooperative throughout exam. Appears younger than stated age  Neck: No deformities, masses, or tenderness noted. Range of motion normal.                                  Musculoskeletal/extremities: No deformity or scoliosis noted of  the thoracic or lumbar spine. No clubbing, cyanosis, edema, or significant extremity  deformity noted. Range of motion normal .Tone & strength  normal.  DJD DIP changes. Nail health good. Able to lie down & sit up w/o help. Negative SLR bilaterally. There is some discomfort with hip rotation. There is no significant pain with percussion over the hip joint  Neurologic: Alert and oriented x3. Deep tendon reflexes symmetrical and normal. .Gait including heel & toe walking normal.        Skin: Intact without suspicious lesions or rashes. Psych: Mood and affect are normal. Normally interactive                                                                                         Assessment & Plan:  #1 right  hip area pain; history this will suggest possible bursitis.  Plan: Orthopedic  referral recommended if no better with anti-inflammatory medications.

## 2012-12-08 ENCOUNTER — Other Ambulatory Visit: Payer: Self-pay | Admitting: Internal Medicine

## 2012-12-08 DIAGNOSIS — M76899 Other specified enthesopathies of unspecified lower limb, excluding foot: Secondary | ICD-10-CM

## 2012-12-08 DIAGNOSIS — M25559 Pain in unspecified hip: Secondary | ICD-10-CM

## 2013-01-04 DIAGNOSIS — M76899 Other specified enthesopathies of unspecified lower limb, excluding foot: Secondary | ICD-10-CM | POA: Diagnosis not present

## 2013-01-05 DIAGNOSIS — D239 Other benign neoplasm of skin, unspecified: Secondary | ICD-10-CM | POA: Diagnosis not present

## 2013-01-05 DIAGNOSIS — Z961 Presence of intraocular lens: Secondary | ICD-10-CM | POA: Diagnosis not present

## 2013-01-07 DIAGNOSIS — R300 Dysuria: Secondary | ICD-10-CM | POA: Diagnosis not present

## 2013-01-11 ENCOUNTER — Ambulatory Visit (INDEPENDENT_AMBULATORY_CARE_PROVIDER_SITE_OTHER): Payer: Medicare Other | Admitting: Cardiovascular Disease

## 2013-01-11 DIAGNOSIS — I2510 Atherosclerotic heart disease of native coronary artery without angina pectoris: Secondary | ICD-10-CM

## 2013-01-11 NOTE — Progress Notes (Signed)
Exercise Treadmill Test  Pre-Exercise Testing Evaluation Rhythm: normal sinus  Rate: 59                 Test  Exercise Tolerance Test Ordering MD: Tonny Bollman, MD  Interpreting MD: Tonny Bollman, MD  Unique Test No: 1  Treadmill:  1  Indication for ETT: CAD  Contraindication to ETT: No   Stress Modality: exercise - treadmill  Cardiac Imaging Performed: non   Protocol: standard Bruce - maximal  Max BP:  211/68  Max MPHR (bpm):  138 85% MPR (bpm):  117  MPHR obtained (bpm):  120 % MPHR obtained:  86  Reached 85% MPHR (min:sec):  6:25 Total Exercise Time (min-sec):  7:01  Workload in METS:  8.3 Borg Scale: 17  Reason ETT Terminated:  dyspnea    ST Segment Analysis At Rest: normal ST segments - no evidence of significant ST depression With Exercise: borderline ST changes  Other Information Arrhythmia:  Yes Angina during ETT:  absent (0) Quality of ETT:  diagnostic  ETT Interpretation:  borderline (indeterminate) with non-specific ST changes  Comments: 1. Fair exercise tolerance for age 79. Borderline ST changes with horizontal/upsloping ST depression at peak exercise and normalization in early recovery phase 3. No angina with exercise 4. Rare PVC's and a single ventricular couplet  Recommendations: Resume exercise program and continue medical management for CAD

## 2013-01-21 DIAGNOSIS — H6120 Impacted cerumen, unspecified ear: Secondary | ICD-10-CM | POA: Diagnosis not present

## 2013-01-31 ENCOUNTER — Telehealth: Payer: Self-pay | Admitting: Internal Medicine

## 2013-01-31 MED ORDER — FLUTICASONE PROPIONATE 50 MCG/ACT NA SUSP: 2.0000 | Freq: Every day | NASAL | Status: DC

## 2013-01-31 NOTE — Telephone Encounter (Signed)
refill Fluticasone nasal SP 120INH 50 MCG use 2 sprays at bedtime.  #16 last fill 3.15.14

## 2013-03-07 ENCOUNTER — Telehealth: Payer: Self-pay | Admitting: Internal Medicine

## 2013-03-07 MED ORDER — FLUTICASONE-SALMETEROL 100-50 MCG/DOSE IN AEPB: 1.0000 | INHALATION_SPRAY | Freq: Two times a day (BID) | RESPIRATORY_TRACT | Status: DC | PRN

## 2013-03-07 NOTE — Telephone Encounter (Signed)
Refill- advair diskus 100/69mcg. Inhale 1 puff po bid. Last filled 11.29.12

## 2013-03-07 NOTE — Telephone Encounter (Signed)
Sent electronically 

## 2013-03-16 ENCOUNTER — Telehealth: Payer: Self-pay | Admitting: Internal Medicine

## 2013-03-16 DIAGNOSIS — R350 Frequency of micturition: Secondary | ICD-10-CM | POA: Diagnosis not present

## 2013-03-16 DIAGNOSIS — C679 Malignant neoplasm of bladder, unspecified: Secondary | ICD-10-CM | POA: Diagnosis not present

## 2013-03-16 NOTE — Telephone Encounter (Signed)
Called patient back on listed number, unable to leave message. I called patient on home number, patient states he did not call requesting lab results

## 2013-03-16 NOTE — Telephone Encounter (Signed)
Patient is wanting his lab result

## 2013-03-19 ENCOUNTER — Other Ambulatory Visit: Payer: Self-pay | Admitting: Internal Medicine

## 2013-03-26 ENCOUNTER — Other Ambulatory Visit: Payer: Self-pay | Admitting: Internal Medicine

## 2013-04-18 DIAGNOSIS — D104 Benign neoplasm of tonsil: Secondary | ICD-10-CM | POA: Diagnosis not present

## 2013-05-05 DIAGNOSIS — K1370 Unspecified lesions of oral mucosa: Secondary | ICD-10-CM | POA: Diagnosis not present

## 2013-05-09 DIAGNOSIS — D239 Other benign neoplasm of skin, unspecified: Secondary | ICD-10-CM | POA: Diagnosis not present

## 2013-05-09 DIAGNOSIS — Z961 Presence of intraocular lens: Secondary | ICD-10-CM | POA: Diagnosis not present

## 2013-05-10 DIAGNOSIS — K1370 Unspecified lesions of oral mucosa: Secondary | ICD-10-CM | POA: Diagnosis not present

## 2013-06-07 DIAGNOSIS — H6120 Impacted cerumen, unspecified ear: Secondary | ICD-10-CM | POA: Diagnosis not present

## 2013-06-16 ENCOUNTER — Other Ambulatory Visit: Payer: Self-pay | Admitting: Cardiovascular Disease

## 2013-06-28 ENCOUNTER — Other Ambulatory Visit: Payer: Self-pay | Admitting: Cardiovascular Disease

## 2013-08-17 ENCOUNTER — Ambulatory Visit (INDEPENDENT_AMBULATORY_CARE_PROVIDER_SITE_OTHER): Payer: Medicare Other | Admitting: Cardiovascular Disease

## 2013-08-17 ENCOUNTER — Encounter: Payer: Self-pay | Admitting: Cardiovascular Disease

## 2013-08-17 VITALS — BP 150/60 | HR 57 | Ht 66.0 in | Wt 154.0 lb

## 2013-08-17 DIAGNOSIS — R079 Chest pain, unspecified: Secondary | ICD-10-CM

## 2013-08-17 DIAGNOSIS — I10 Essential (primary) hypertension: Secondary | ICD-10-CM | POA: Diagnosis not present

## 2013-08-17 DIAGNOSIS — E785 Hyperlipidemia, unspecified: Secondary | ICD-10-CM

## 2013-08-17 DIAGNOSIS — I2510 Atherosclerotic heart disease of native coronary artery without angina pectoris: Secondary | ICD-10-CM

## 2013-08-17 NOTE — Patient Instructions (Addendum)
Your physician has requested that you have an exercise stress myoview. For further information please visit [email or web address]. Please follow instruction sheet, as given.  Your physician wants you to follow-up in: 6 MONTHS with Dr Cooper.  You will receive a reminder letter in the mail two months in advance. If you don't receive a letter, please call our office to schedule the follow-up appointment.  Your physician recommends that you continue on your current medications as directed. Please refer to the Current Medication list given to you today.  

## 2013-08-17 NOTE — Progress Notes (Signed)
HPI:   This is an 79 year old gentleman presenting for followup of coronary artery disease. The patient presented with unstable angina in 2005 and was treated with overlapping drug-eluting stents in the proximal LAD and a third stent in the distal LAD. He had no significant disease in his left circumflex or right coronary artery. His last stress test was in June 2011 and this demonstrated a mild fixed defect in the inferior wall and inferoseptum. There was no ischemia noted and the left ventricular ejection fraction was gated at 60%. His last stress test was in February 2014. This demonstrated fair exercise tolerance for age. There were borderline ST changes. He had no angina with exercise. Ongoing medical therapy was recommended.  The patient is having more frequent left-sided sharp chest pains. He has never had typical symptoms of angina. He denies exertional chest pressure or shortness of breath. He denies edema, palpitations, orthopnea, or PND. He's had no lightheadedness or syncope. He has not been exercising as vigorously as he did in the past. His left-sided chest discomfort is more intense than in the past, and it generally occurs at rest. It is nonradiating.   Outpatient Encounter Prescriptions as of 08/17/2013  Medication Sig Dispense Refill  . amLODipine (NORVASC) 5 MG tablet TAKE ONE TABLET BY MOUTH ONE TIME DAILY  30 tablet  11  . aspirin 81 MG tablet Take 81 mg by mouth daily.        . clopidogrel (PLAVIX) 75 MG tablet TAKE ONE TABLET BY MOUTH ONE TIME DAILY  30 tablet  6  . CRESTOR 5 MG tablet TAKE ONE TABLET BY MOUTH DAILY ON MONDAY, WEDNESDAY AND FRIDAY  30 tablet  11  . fexofenadine (ALLEGRA) 180 MG tablet as needed.       . fluticasone (FLONASE) 50 MCG/ACT nasal spray Place 2 sprays into the nose at bedtime.  16 g  5  . Fluticasone-Salmeterol (ADVAIR DISKUS) 100-50 MCG/DOSE AEPB Inhale 1 puff into the lungs 2 (two) times daily as needed.  60 each  3  . MYRBETRIQ 50 MG TB24 tablet  Take 50 mg by mouth daily.       . nitroGLYCERIN (NITROSTAT) 0.4 MG SL tablet Place 1 tablet (0.4 mg total) under the tongue every 5 (five) minutes as needed.  25 tablet  3  . ranitidine (ZANTAC) 150 MG tablet TAKE ONE TABLET BY MOUTH TWICE DAILY  180 tablet  1  . RAPAFLO 8 MG CAPS capsule Take 4 mg by mouth daily with breakfast.       . sucralfate (CARAFATE) 1 G tablet TAKE ONE TABLET BY MOUTH THREE TIMES DAILY WITH MEALS.  270 tablet  1  . VIAGRA 100 MG tablet TAKE ONE-HALF(1/2) TO ONE (1) TABLET(S) ONCE DAILY AS NEEDED  6 tablet  2  . [DISCONTINUED] clonazePAM (KLONOPIN) 0.5 MG tablet 1 qhs prn for feet cramps  30 tablet  0  . [DISCONTINUED] glucosamine-chondroitin 500-400 MG tablet Take 2 tablets by mouth daily.       . [DISCONTINUED] meloxicam (MOBIC) 15 MG tablet Take 15 mg by mouth daily.      . [DISCONTINUED] celecoxib (CELEBREX) 200 MG capsule Take 1 capsule (200 mg total) by mouth 2 (two) times daily.  20 capsule  0  . [DISCONTINUED] phenazopyridine (PYRIDIUM) 200 MG tablet Take 200 mg by mouth 3 (three) times daily as needed.       No facility-administered encounter medications on file as of 08/17/2013.    Allergies  Allergen Reactions  .  Hydromorphone Hcl      Dilaudid Rxed for renal calculi caused angioedema   . Zocor [Simvastatin - High Dose]     Elevated hepatic enzymes    Past Medical History  Diagnosis Date  . CAD (coronary artery disease)     a. s/p Cypher DES x 2 to prox LAD and Cypher DES x 1 to distal LAD 08/2009;  b. cath with residual D2 50% 10/05 and normal EF;  c. Myoview 6/11: EF 60%, mild fixed INF, inferoseptal, inferoapical thinning, no ischemia, low risk, EF 60%  . HLD (hyperlipidemia)     h/o elevated LFTs on Zocor  . GERD (gastroesophageal reflux disease)   . Asthma     post infectious  . Skin cancer, basal cell     Dr Londell Moh  . Nephrolithiasis   . Colon polyps   . Bladder cancer   . Other abnormal glucose 2012    FBS 104    ROS: Negative  except as per HPI  BP 150/60  Pulse 57  Ht 5\' 6"  (1.676 m)  Wt 69.854 kg (154 lb)  BMI 24.87 kg/m2  PHYSICAL EXAM: Pt is alert and oriented, pleasant elderly male in NAD HEENT: normal Neck: JVP - normal, carotids 2+= without bruits Lungs: CTA bilaterally CV: RRR without murmur or gallop Abd: soft, NT, Positive BS, no hepatomegaly Ext: no C/C/E, distal pulses intact and equal Skin: warm/dry no rash  EKG:  Sinus bradycardia 57 beats per minute, otherwise within normal limits   ASSESSMENT AND PLAN: 1. Coronary artery disease, native vessel. He has undergone PCI of the LAD as outlined above. He is having a typical anginal symptoms. However, after an equivocal stress test 6 months ago with worsening chest pain, I have recommended an exercise Myoview stress scan for further risk stratification. His medications were not changed today. I have reviewed his antianginal program which is appropriate. He remains on long-term dual antiplatelet therapy with aspirin and Plavix considering his multiple first-generation drug-eluting stents.  2. Hypertension. Blood pressures reviewed. He has systolic hypertension here, but other blood pressures have been within range. Amlodipine was added last year. Will continue.  3. Hyperlipidemia. The patient remains on a low dose of Crestor with dose limitation because of myalgias.  For followup, I will see him back in 6 months unless his stress test shows significant ischemia.  Tonny Bollman 08/17/2013 11:07 AM

## 2013-08-25 ENCOUNTER — Encounter: Payer: Self-pay | Admitting: Internal Medicine

## 2013-08-25 ENCOUNTER — Encounter: Payer: Self-pay | Admitting: Cardiovascular Disease

## 2013-08-31 ENCOUNTER — Ambulatory Visit (HOSPITAL_COMMUNITY): Payer: Medicare Other | Attending: Cardiology | Admitting: Radiology

## 2013-08-31 VITALS — BP 147/72 | HR 60 | Ht 67.0 in | Wt 156.0 lb

## 2013-08-31 DIAGNOSIS — I10 Essential (primary) hypertension: Secondary | ICD-10-CM | POA: Insufficient documentation

## 2013-08-31 DIAGNOSIS — R079 Chest pain, unspecified: Secondary | ICD-10-CM | POA: Insufficient documentation

## 2013-08-31 DIAGNOSIS — Z87891 Personal history of nicotine dependence: Secondary | ICD-10-CM | POA: Insufficient documentation

## 2013-08-31 DIAGNOSIS — R9439 Abnormal result of other cardiovascular function study: Secondary | ICD-10-CM | POA: Insufficient documentation

## 2013-08-31 DIAGNOSIS — E785 Hyperlipidemia, unspecified: Secondary | ICD-10-CM

## 2013-08-31 DIAGNOSIS — R42 Dizziness and giddiness: Secondary | ICD-10-CM | POA: Insufficient documentation

## 2013-08-31 DIAGNOSIS — R0602 Shortness of breath: Secondary | ICD-10-CM | POA: Diagnosis not present

## 2013-08-31 DIAGNOSIS — I4949 Other premature depolarization: Secondary | ICD-10-CM | POA: Diagnosis not present

## 2013-08-31 DIAGNOSIS — R002 Palpitations: Secondary | ICD-10-CM | POA: Insufficient documentation

## 2013-08-31 DIAGNOSIS — I2510 Atherosclerotic heart disease of native coronary artery without angina pectoris: Secondary | ICD-10-CM

## 2013-08-31 MED ORDER — TECHNETIUM TC 99M SESTAMIBI GENERIC - CARDIOLITE
33.0000 | Freq: Once | INTRAVENOUS | Status: AC | PRN
  Administered 2013-08-31: 33 via INTRAVENOUS

## 2013-08-31 MED ORDER — TECHNETIUM TC 99M SESTAMIBI GENERIC - CARDIOLITE
11.0000 | Freq: Once | INTRAVENOUS | Status: AC | PRN
  Administered 2013-08-31: 11 via INTRAVENOUS

## 2013-08-31 NOTE — Progress Notes (Signed)
[facility] SITE 3 NUCLEAR MED [address] [place], [place] [address] [phone]    Cardiology Nuclear Med Study  [name] [name] is a 79 y.o. male     MRN : [id number]     DOB: 06/02/1929  Procedure Date: 08/31/2013  Nuclear Med Background Indication for Stress Test:  Evaluation for Ischemia and Stent Patency History:  2014 GXT, 2011 MPS Normal EF 60%, 2005 Stents LAD Cardiac Risk Factors: History of Smoking, Hypertension and Lipids  Symptoms:  Chest Pain (last episode of chest discomfort:now, 1/10), Dizziness and Palpitations   Nuclear Pre-Procedure Caffeine/Decaff Intake:  None NPO After: 8:00pm   Lungs:  Clear. O2 Sat: 98% on room air. IV 0.9% NS with Angio Cath:  22g  IV Site: R Antecubital  IV Started by:  Bonnita Levan, RN  Chest Size (in):  44 Cup Size: n/a  Height: 5\' 7"  (1.702 m)  Weight:  156 lb (70.761 kg)  BMI:  Body mass index is 24.43 kg/(m^2). Tech Comments:  N/A    Nuclear Med Study 1 or 2 day study: 1 day  Stress Test Type:  Stress  Reading MD: Marca Ancona, MD  Order Authorizing Provider:  Tonny Bollman, MD  Resting Radionuclide: Technetium 27m Sestamibi  Resting Radionuclide Dose: 11.0 mCi   Stress Radionuclide:  Technetium 49m Sestamibi  Stress Radionuclide Dose: 33.0 mCi           Stress Protocol Rest HR: 60 Stress HR: 126  Rest BP: 147/72 Stress BP: 188/65  Exercise Time (min): 6:31 METS: 7.7   Predicted Max HR: 138 bpm % Max HR: 91.3 bpm Rate Pressure Product: 21308   Dose of Adenosine (mg):  n/a Dose of Lexiscan: n/a mg  Dose of Atropine (mg): n/a Dose of Dobutamine: n/a mcg/kg/min (at max HR)  Stress Test Technologist: Smiley Houseman, CMA-N  Nuclear Technologist:  Doyne Keel, CNMT     Rest Procedure:  Myocardial perfusion imaging was performed at rest 45 minutes following the intravenous administration of Technetium 62m Sestamibi.  Rest ECG: NSR - Normal EKG  Stress Procedure:  The patient exercised on the treadmill  utilizing the Bruce Protocol for 6:31 minutes. The patient stopped due to fatigue and denied any chest pain.  There were occasional PVC's and PAC's noted.   Technetium 63m Sestamibi was injected at peak exercise and myocardial perfusion imaging was performed after a brief delay.  Stress ECG: Insignificant upsloping ST segment depression.  QPS Raw Data Images:  Normal; no motion artifact; normal heart/lung ratio. Stress Images:  Medium-sized, mild basal to apical inferior perfusion defect. Rest Images:  Medium-sized, mild basal to apical inferior perfusion defect.  Subtraction (SDS):  Fixed, medium-sized mild basal to apical inferior perfusion defect.  Transient Ischemic Dilatation (Normal <1.22):  0.93 Lung/Heart Ratio (Normal <0.45):  0.33  Quantitative Gated Spect Images QGS EDV:  94 ml QGS ESV:  41 ml  Impression Exercise Capacity:  Fair exercise capacity. BP Response:  Normal blood pressure response. Clinical Symptoms:  Short of breath, fatigued.  ECG Impression:  Insignificant upsloping ST segment depression. Comparison with Prior Nuclear Study: No significant change from previous study  Overall Impression:  Low risk stress nuclear study with a medium-sized, mild fixed basal to apical inferior perfusion defect.  Given normal wall motion, This may be diaphragmatic attenuation.  No evidence for ischemia. .  LV Ejection Fraction: 56%.  LV Wall Motion:  NL LV Function; NL Wall Motion  Marca Ancona 08/31/2013

## 2013-09-14 ENCOUNTER — Ambulatory Visit (INDEPENDENT_AMBULATORY_CARE_PROVIDER_SITE_OTHER): Payer: Medicare Other

## 2013-09-14 DIAGNOSIS — Z23 Encounter for immunization: Secondary | ICD-10-CM | POA: Diagnosis not present

## 2013-09-27 DIAGNOSIS — C679 Malignant neoplasm of bladder, unspecified: Secondary | ICD-10-CM | POA: Diagnosis not present

## 2013-09-29 ENCOUNTER — Other Ambulatory Visit: Payer: Self-pay | Admitting: Internal Medicine

## 2013-09-29 NOTE — Telephone Encounter (Signed)
Fluticasone refilled

## 2013-10-19 ENCOUNTER — Ambulatory Visit: Payer: Medicare Other | Admitting: Internal Medicine

## 2013-10-20 ENCOUNTER — Encounter: Payer: Self-pay | Admitting: Internal Medicine

## 2013-10-20 ENCOUNTER — Ambulatory Visit (INDEPENDENT_AMBULATORY_CARE_PROVIDER_SITE_OTHER): Payer: Medicare Other | Admitting: Internal Medicine

## 2013-10-20 VITALS — BP 144/61 | HR 62 | Temp 98.3°F | Ht 66.0 in | Wt 157.0 lb

## 2013-10-20 DIAGNOSIS — J0110 Acute frontal sinusitis, unspecified: Secondary | ICD-10-CM

## 2013-10-20 DIAGNOSIS — J209 Acute bronchitis, unspecified: Secondary | ICD-10-CM

## 2013-10-20 MED ORDER — AMOXICILLIN 500 MG PO CAPS: 500.0000 mg | ORAL_CAPSULE | Freq: Three times a day (TID) | ORAL | Status: DC

## 2013-10-20 NOTE — Progress Notes (Signed)
   Subjective:    Patient ID: [name] [name], male    DOB: [birth year], 79 y.o.   MRN: [id number]  HPI  Symptoms began 10/13/13 as a dry cough. This has improved without treatment. He has had some scant sputum and associated low-grade wheezing, mainly @ night. He has not used his Advair  As of today he's noted frontal sinus discomfort and yellow-green nasal discharge. He's also had some discomfort in his ears. He's had some itchy, watery eyes.   He's actually had some postnasal drainage for one month intermittently.  He does have a history of asthma; he also has had pneumonia. He quit smoking in 1962.    Review of Systems  He denies maxillary sinus area pain, dental pain, or otic discharge.  There has been no significant fever, chills, or sweats.       Objective:   Physical Exam  General appearance:good health ;well nourished; no acute distress or increased work of breathing is present.  No  lymphadenopathy about the head, neck, or axilla noted.  Appears younger than stated age  Eyes: No conjunctival inflammation or lid edema is present. There is OD scleral hemorrhage.  Ears:  External ear exam shows no significant lesions or deformities.  Otoscopic examination reveals clear canals, tympanic membranes are intact bilaterally without bulging, retraction, inflammation or discharge.  Nose:  External nasal examination shows no deformity or inflammation. Nasal mucosa are pink and moist without lesions or exudates. No septal dislocation or deviation.No obstruction to airflow.  Hyponasal speech  Oral exam: Dental hygiene is good; lips and gums are healthy appearing.There is no oropharyngeal erythema or exudate noted.   Neck:  No deformities, thyromegaly, masses, or tenderness noted.   Supple with full range of motion without pain.   Heart:  Normal rate and regular rhythm. S1 and S2 normal without gallop, murmur, click,or  rub . Slightly distant  Heart  sounds.   Lungs:Chest clear to  auscultation; no wheezes, rhonchi,rales ,or rubs present.No increased work of breathing.    Extremities:  No cyanosis, edema, or clubbing  noted    Skin: Warm & dry .         Assessment & Plan:  #1 frontal sinusitis  #2 bronchitis, acute. No bronchospasm clinically present at this time  See orders

## 2013-10-20 NOTE — Patient Instructions (Signed)
Advair one  Inhalation every 12 hours; gargle and spit after usePlain Mucinex (NOT D) for thick secretions ;force NON dairy fluids .   Nasal cleansing in the shower as discussed with lather of mild shampoo.After 10 seconds wash off lather while  exhaling through nostrils. Make sure that all residual soap is removed to prevent irritation.  Nasacort AQ OTC  1 spray in each nostril twice a day as needed. Use the "crossover" technique into opposite nostril spraying toward opposite ear @ 45 degree angle, not straight up into nostril.  Use a Neti pot daily only  as needed for significant sinus congestion; going from open side to congested side . Plain Allegra (NOT D )  160 daily , Loratidine 10 mg , OR Zyrtec 10 mg @ bedtime  as needed for itchy eyes & sneezing.

## 2013-10-20 NOTE — Progress Notes (Signed)
Pre visit review using our clinic review tool, if applicable. No additional management support is needed unless otherwise documented below in the visit note. 

## 2013-10-21 DIAGNOSIS — C679 Malignant neoplasm of bladder, unspecified: Secondary | ICD-10-CM | POA: Diagnosis not present

## 2013-10-26 ENCOUNTER — Other Ambulatory Visit: Payer: Self-pay | Admitting: Internal Medicine

## 2013-10-27 NOTE — Telephone Encounter (Signed)
Requesting Viagra 100mg  Last refill:07-25-11 Pt due CPE Please advise.//AB/CMA

## 2013-10-27 NOTE — Telephone Encounter (Signed)
OK X1;R X 1 

## 2013-10-31 DIAGNOSIS — J310 Chronic rhinitis: Secondary | ICD-10-CM | POA: Diagnosis not present

## 2013-10-31 DIAGNOSIS — H6120 Impacted cerumen, unspecified ear: Secondary | ICD-10-CM | POA: Diagnosis not present

## 2013-10-31 DIAGNOSIS — R0982 Postnasal drip: Secondary | ICD-10-CM | POA: Diagnosis not present

## 2013-11-08 ENCOUNTER — Telehealth: Payer: Self-pay | Admitting: Internal Medicine

## 2013-11-08 MED ORDER — PREDNISONE 20 MG PO TABS: 20.0000 mg | ORAL_TABLET | Freq: Two times a day (BID) | ORAL | Status: DC

## 2013-11-08 MED ORDER — BENZONATATE 200 MG PO CAPS: 200.0000 mg | ORAL_CAPSULE | Freq: Three times a day (TID) | ORAL | Status: DC | PRN

## 2013-11-08 NOTE — Telephone Encounter (Signed)
LM @ (5:00pm) informing the pt that Dr. Sherrilee Gilles to call something in for his cough and congestion.  Also informed the pt if he's not any better he will need a OV.  Rx's sent to the pharmacy by e-script.//AB/CMA

## 2013-11-08 NOTE — Telephone Encounter (Signed)
Please advise 

## 2013-11-08 NOTE — Telephone Encounter (Signed)
Tessalon 200 mg 1 tid prn # 15 & prednisone 20 mg bid # 10. OVINB

## 2013-11-08 NOTE — Telephone Encounter (Signed)
Patient states that he still has a hacking cough and some chest congestion. Wants to know what Dr. Alwyn Ren would like him to do. Please advise.

## 2013-11-08 NOTE — Addendum Note (Signed)
Addended by: Verdie Shire on: 11/08/2013 05:12 PM   Modules accepted: Orders

## 2013-12-06 ENCOUNTER — Other Ambulatory Visit: Payer: Self-pay | Admitting: Internal Medicine

## 2013-12-07 NOTE — Telephone Encounter (Signed)
Ranitidine refilled per protocol. JG//CMA 

## 2013-12-09 ENCOUNTER — Ambulatory Visit (INDEPENDENT_AMBULATORY_CARE_PROVIDER_SITE_OTHER): Payer: Medicare Other | Admitting: Internal Medicine

## 2013-12-09 ENCOUNTER — Encounter: Payer: Self-pay | Admitting: Internal Medicine

## 2013-12-09 VITALS — BP 142/61 | HR 62 | Temp 98.0°F | Wt 157.8 lb

## 2013-12-09 DIAGNOSIS — R059 Cough, unspecified: Secondary | ICD-10-CM | POA: Diagnosis not present

## 2013-12-09 DIAGNOSIS — J45909 Unspecified asthma, uncomplicated: Secondary | ICD-10-CM | POA: Diagnosis not present

## 2013-12-09 DIAGNOSIS — K219 Gastro-esophageal reflux disease without esophagitis: Secondary | ICD-10-CM | POA: Diagnosis not present

## 2013-12-09 DIAGNOSIS — R05 Cough: Secondary | ICD-10-CM

## 2013-12-09 MED ORDER — MONTELUKAST SODIUM 10 MG PO TABS: 10.0000 mg | ORAL_TABLET | Freq: Every day | ORAL | Status: DC

## 2013-12-09 MED ORDER — BUDESONIDE-FORMOTEROL FUMARATE 160-4.5 MCG/ACT IN AERO: 2.0000 | INHALATION_SPRAY | Freq: Two times a day (BID) | RESPIRATORY_TRACT | Status: DC

## 2013-12-09 NOTE — Patient Instructions (Signed)
Reflux of gastric acid may be asymptomatic as this may occur mainly during sleep.The triggers for reflux  include stress; the "aspirin family" ; alcohol; peppermint; and caffeine (coffee, tea, cola, and chocolate). The aspirin family would include aspirin and the nonsteroidal agents such as ibuprofen &  Naproxen. Tylenol would not cause reflux. If having symptoms ; food & drink should be avoided for @ least 2 hours before going to bed.  Symbicort  one - two inhalations every 12 hours; gargle and spit after use

## 2013-12-09 NOTE — Progress Notes (Signed)
   Subjective:    Patient ID: [name] [name], male    DOB: 09/08/1929, 80 y.o.   MRN: [id number]  HPI  He was treated in early December for frontal sinusitis and bronchitis. At that time there was no evidence clinically of bronchospasm. He received antibiotics and was instructed in aggressive nasal hygiene. He stated that he did improve; the cough was "40-50%" improved  The cough had recurred in late 12/14. He took cough suppressant pills and a short course of prednisone with good response. It has recurred as of 12/07/13 and is paroxysmal, particularly present in the morning. He describes it as dry. He also has noted some wheezing.  PMH of asthma; Advair bid does not help. This cough is reminiscent of that he said with his asthma  He has had  chronic reflux & using ranitidine as well as Sucalfrate    Review of Systems    He denies frontal headache, facial pain, nasal purulence, sore throat, dental pain, otic pain, otic discharge  He's had some watery eyes but no sneezing  He has no fever, chills, or sweats        Objective:   Physical Exam  General appearance:good health ;well nourished; no acute distress or increased work of breathing is present.  No  lymphadenopathy about the head, neck, or axilla noted. Appears younger than stated age  Eyes: No conjunctival inflammation or lid edema is present.  Ears:  External ear exam shows no significant lesions or deformities.  Otoscopic examination reveals clear canals, tympanic membranes are intact bilaterally without bulging, retraction, inflammation or discharge.  Nose:  External nasal examination shows no deformity or inflammation. Nasal mucosa are pink and moist without lesions or exudates. No septal dislocation or deviation.No obstruction to airflow.   Oral exam: Dental hygiene is good; lips and gums are healthy appearing.There is no oropharyngeal erythema or exudate noted.   Neck:  No deformities,  masses, or tenderness noted.       Heart:  Normal rate and regular rhythm. S1 and S2 normal without gallop, murmur, click, rub or other extra sounds.   Lungs:Diffuse low grade rales .No increased work of breathing.    Extremities:  No cyanosis, edema, or clubbing  noted    Skin: Warm & dry         Assessment & Plan:  #1 cough.He states that his cough is reminiscent of that he has with asthma.  #2 reflux; quiescent by history  See orders

## 2013-12-09 NOTE — Progress Notes (Signed)
Pre visit review using our clinic review tool, if applicable. No additional management support is needed unless otherwise documented below in the visit note. 

## 2014-01-21 ENCOUNTER — Other Ambulatory Visit: Payer: Self-pay | Admitting: Internal Medicine

## 2014-01-27 ENCOUNTER — Ambulatory Visit (INDEPENDENT_AMBULATORY_CARE_PROVIDER_SITE_OTHER)
Admission: RE | Admit: 2014-01-27 | Discharge: 2014-01-27 | Disposition: A | Discharge status: Home / Self Care | Payer: Medicare Other | Source: Ambulatory Visit | Attending: Internal Medicine | Admitting: Internal Medicine

## 2014-01-27 ENCOUNTER — Encounter: Payer: Self-pay | Admitting: Internal Medicine

## 2014-01-27 ENCOUNTER — Ambulatory Visit (INDEPENDENT_AMBULATORY_CARE_PROVIDER_SITE_OTHER): Payer: Medicare Other | Admitting: Internal Medicine

## 2014-01-27 VITALS — BP 140/50 | HR 64 | Temp 97.8°F | Ht 66.0 in | Wt 157.2 lb

## 2014-01-27 DIAGNOSIS — Z0000 Encounter for general adult medical examination without abnormal findings: Secondary | ICD-10-CM | POA: Diagnosis not present

## 2014-01-27 DIAGNOSIS — R071 Chest pain on breathing: Secondary | ICD-10-CM

## 2014-01-27 DIAGNOSIS — G8929 Other chronic pain: Secondary | ICD-10-CM

## 2014-01-27 DIAGNOSIS — R0789 Other chest pain: Secondary | ICD-10-CM

## 2014-01-27 DIAGNOSIS — J45909 Unspecified asthma, uncomplicated: Secondary | ICD-10-CM | POA: Diagnosis not present

## 2014-01-27 DIAGNOSIS — Z86010 Personal history of colonic polyps: Secondary | ICD-10-CM

## 2014-01-27 DIAGNOSIS — E785 Hyperlipidemia, unspecified: Secondary | ICD-10-CM | POA: Diagnosis not present

## 2014-01-27 DIAGNOSIS — I10 Essential (primary) hypertension: Secondary | ICD-10-CM | POA: Diagnosis not present

## 2014-01-27 NOTE — Progress Notes (Signed)
Pre visit review using our clinic review tool, if applicable. No additional management support is needed unless otherwise documented below in the visit note. 

## 2014-01-27 NOTE — Patient Instructions (Signed)
Your next office appointment will be determined based upon review of your pending labs & x-rays. Those instructions will be transmitted to you through My Chart   Followup as needed for your acute issue. Please report any significant change in your symptoms.Use an anti-inflammatory cream such as Aspercreme or Zostrix cream twice a day to the affected area as needed. In lieu of this warm moist compresses or  hot water bottle can be used. Do not apply ice .

## 2014-01-27 NOTE — Progress Notes (Signed)
Subjective:    Patient ID: [name] [name], male    DOB: Mar 19, 1929, 80 y.o.   MRN: [id number]  HPI Medicare Wellness Visit: Psychosocial and medical history were reviewed as required by Medicare (history related to abuse, antisocial behavior , firearm risk). Social history: Caffeine:1 cup green tea  , Alcohol:10 drinks/week  , Tobacco ZJI:RCVE 1962 Exercise:see below Personal safety/fall risk:no Limitations of activities of daily living:no Seatbelt/ smoke alarm use:yes Healthcare Power of Attorney/Living Will status: UTD Ophthalmologic exam status:UTD Hearing evaluation status:hearing aids not checked for >2 years Orientation: Oriented X 3 Memory and recall: good Math testing: good Depression/anxiety assessment: no Foreign travel history:2007 [place] Immunization status for influenza/pneumonia/ shingles /tetanus:UTD Transfusion history:no Preventive health care maintenance status: Colonoscopy/BMD as per protocol/standard care:? Both due Dental care:every 6 mos Chart reviewed and updated. Active issues reviewed and addressed as documented below.    Review of Systems Blood pressure range / average :no monitor Compliant with anti hypertemsive medication. No lightheadedness or other adverse medication effect described.  A heart healthy /low salt diet is followed. Exercise encompasses  45-60 minutes >3  times per week as tennis & gym without symptoms.  Family history is  negative for HTN /CVA  Significant headaches, epistaxis, exertional dyspnea, claudication, paroxysmal nocturnal dyspnea, or edema absent.  Chest pain evaluated by Dr Burt Knack.It is positional ,especially while driving.Heat improves it.     Objective:   Physical Exam Gen.: Healthy and well-nourished in appearance. Alert, appropriate and cooperative throughout exam. Appears younger than stated age  Head: Normocephalic without obvious abnormalities; pattern alopecia  Eyes: No corneal or conjunctival inflammation  noted. Pupils equal round reactive to light and accommodation. Extraocular motion intact.  Ears: External  ear exam reveals no significant lesions or deformities. Some wax blaterally.Marland Kitchen Hearing is grossly decreased on L.. Nose: External nasal exam reveals no deformity or inflammation. Nasal mucosa are pink and moist. No lesions or exudates noted.   Mouth: Oral mucosa and oropharynx reveal no lesions or exudates. Upper plate in good repair. Neck: No deformities, masses, or tenderness noted. Range of motion & Thyroid normal Lungs: Normal respiratory effort; chest expands symmetrically. Lungs are clear to auscultation without rales, wheezes, or increased work of breathing Chest:some slight tenderness mid  LSB  Heart: Normal rate and rhythm. Normal S1 and S2. No gallop, click, or rub. S4 w/o murmur. Abdomen: Bowel sounds normal; abdomen soft and nontender. No masses, organomegaly or hernias noted. Genitalia: as per Urology                                 Musculoskeletal/extremities: No deformity or scoliosis noted of  the thoracic or lumbar spine.   No clubbing, cyanosis, edema, or significant extremity  deformity noted. Range of motion normal .Tone & strength normal. Hand joints  reveal mixed PIP / DIP changes.  Fingernail / toenail health good. Able to lie down & sit up w/o help. Negative SLR bilaterally Vascular: Carotid, radial artery, dorsalis pedis and  posterior tibial pulses are full and equal. No bruits present. Neurologic: Alert and oriented x3. Deep tendon reflexes symmetrical and normal.  Gait normal  including heel & toe walking . Rhomberg & finger to nose       Skin: Intact without suspicious lesions or rashes. Lymph: No cervical, axillary lymphadenopathy present. Psych: Mood and affect are normal. Normally interactive  Assessment & Plan:  #1 Medicare Wellness Exam; criteria met ; data  entered #2 Problem List/Diagnoses reviewed #3 chest wall pain ; costochondritis variant Plan:  Assessments made/ Orders entered

## 2014-01-30 ENCOUNTER — Telehealth: Payer: Self-pay | Admitting: Internal Medicine

## 2014-01-30 ENCOUNTER — Other Ambulatory Visit: Payer: Self-pay | Admitting: Internal Medicine

## 2014-01-30 ENCOUNTER — Encounter: Payer: Self-pay | Admitting: Internal Medicine

## 2014-01-30 DIAGNOSIS — R9389 Abnormal findings on diagnostic imaging of other specified body structures: Secondary | ICD-10-CM

## 2014-01-30 NOTE — Telephone Encounter (Signed)
Relevant patient education assigned to patient using Emmi. ° °

## 2014-01-31 DIAGNOSIS — Z23 Encounter for immunization: Secondary | ICD-10-CM | POA: Diagnosis not present

## 2014-02-06 ENCOUNTER — Other Ambulatory Visit (INDEPENDENT_AMBULATORY_CARE_PROVIDER_SITE_OTHER): Payer: Medicare Other

## 2014-02-06 ENCOUNTER — Ambulatory Visit (INDEPENDENT_AMBULATORY_CARE_PROVIDER_SITE_OTHER)
Admission: RE | Admit: 2014-02-06 | Discharge: 2014-02-06 | Disposition: A | Discharge status: Home / Self Care | Payer: Medicare Other | Source: Ambulatory Visit | Attending: Internal Medicine | Admitting: Internal Medicine

## 2014-02-06 DIAGNOSIS — R918 Other nonspecific abnormal finding of lung field: Secondary | ICD-10-CM | POA: Diagnosis not present

## 2014-02-06 DIAGNOSIS — E785 Hyperlipidemia, unspecified: Secondary | ICD-10-CM

## 2014-02-06 DIAGNOSIS — R911 Solitary pulmonary nodule: Secondary | ICD-10-CM | POA: Diagnosis not present

## 2014-02-06 DIAGNOSIS — Z86010 Personal history of colonic polyps: Secondary | ICD-10-CM | POA: Diagnosis not present

## 2014-02-06 DIAGNOSIS — R9389 Abnormal findings on diagnostic imaging of other specified body structures: Secondary | ICD-10-CM

## 2014-02-06 LAB — CBC WITH DIFFERENTIAL/PLATELET
Basophils Absolute: 0 10*3/uL (ref 0.0–0.1)
Basophils Relative: 0.3 % (ref 0.0–3.0)
Eosinophils Absolute: 0.1 10*3/uL (ref 0.0–0.7)
Eosinophils Relative: 2.3 % (ref 0.0–5.0)
HCT: 42.3 % (ref 39.0–52.0)
Hemoglobin: 14.1 g/dL (ref 13.0–17.0)
Lymphocytes Relative: 29.6 % (ref 12.0–46.0)
Lymphs Abs: 1.5 10*3/uL (ref 0.7–4.0)
MCHC: 33.3 g/dL (ref 30.0–36.0)
MCV: 96.4 fl (ref 78.0–100.0)
MONO ABS: 0.5 10*3/uL (ref 0.1–1.0)
Monocytes Relative: 10.5 % (ref 3.0–12.0)
NEUTROS PCT: 57.3 % (ref 43.0–77.0)
Neutro Abs: 3 10*3/uL (ref 1.4–7.7)
PLATELETS: 191 10*3/uL (ref 150.0–400.0)
RBC: 4.39 Mil/uL (ref 4.22–5.81)
RDW: 13 % (ref 11.5–14.6)
WBC: 5.2 10*3/uL (ref 4.5–10.5)

## 2014-02-06 LAB — BASIC METABOLIC PANEL
BUN: 18 mg/dL (ref 6–23)
CHLORIDE: 109 meq/L (ref 96–112)
CO2: 26 mEq/L (ref 19–32)
Calcium: 9.1 mg/dL (ref 8.4–10.5)
Creatinine, Ser: 1 mg/dL (ref 0.4–1.5)
GFR: 76.71 mL/min (ref >60.00)
Glucose, Bld: 84 mg/dL (ref 70–99)
POTASSIUM: 3.9 meq/L (ref 3.5–5.1)
SODIUM: 143 meq/L (ref 135–145)

## 2014-02-06 LAB — HEPATIC FUNCTION PANEL
ALT: 23 U/L (ref 0–53)
AST: 22 U/L (ref 0–37)
Albumin: 4.2 g/dL (ref 3.5–5.2)
Alkaline Phosphatase: 112 U/L (ref 39–117)
BILIRUBIN DIRECT: 0.2 mg/dL (ref 0.0–0.3)
BILIRUBIN TOTAL: 0.8 mg/dL (ref 0.3–1.2)
TOTAL PROTEIN: 6.6 g/dL (ref 6.0–8.3)

## 2014-02-06 LAB — TSH: TSH: 5.42 u[IU]/mL (ref 0.35–5.50)

## 2014-02-06 LAB — LIPID PANEL
CHOL/HDL RATIO: 2
Cholesterol: 158 mg/dL (ref 0–200)
HDL: 83.1 mg/dL (ref >39.00)
LDL CALC: 71 mg/dL (ref 0–99)
Triglycerides: 20 mg/dL (ref 0.0–149.0)
VLDL: 4 mg/dL (ref 0.0–40.0)

## 2014-02-07 ENCOUNTER — Other Ambulatory Visit: Payer: Self-pay | Admitting: Internal Medicine

## 2014-02-07 DIAGNOSIS — R946 Abnormal results of thyroid function studies: Secondary | ICD-10-CM

## 2014-02-14 DIAGNOSIS — N401 Enlarged prostate with lower urinary tract symptoms: Secondary | ICD-10-CM | POA: Diagnosis not present

## 2014-02-14 DIAGNOSIS — C689 Malignant neoplasm of urinary organ, unspecified: Secondary | ICD-10-CM | POA: Diagnosis not present

## 2014-02-14 DIAGNOSIS — Z8551 Personal history of malignant neoplasm of bladder: Secondary | ICD-10-CM | POA: Diagnosis not present

## 2014-02-19 ENCOUNTER — Other Ambulatory Visit: Payer: Self-pay | Admitting: Internal Medicine

## 2014-03-03 ENCOUNTER — Other Ambulatory Visit: Payer: Self-pay

## 2014-03-03 ENCOUNTER — Other Ambulatory Visit: Payer: Self-pay | Admitting: Cardiovascular Disease

## 2014-03-08 ENCOUNTER — Ambulatory Visit (INDEPENDENT_AMBULATORY_CARE_PROVIDER_SITE_OTHER): Payer: Medicare Other | Admitting: Cardiovascular Disease

## 2014-03-08 ENCOUNTER — Encounter: Payer: Self-pay | Admitting: Cardiovascular Disease

## 2014-03-08 VITALS — BP 139/70 | HR 54 | Ht 66.0 in | Wt 156.0 lb

## 2014-03-08 DIAGNOSIS — I2510 Atherosclerotic heart disease of native coronary artery without angina pectoris: Secondary | ICD-10-CM | POA: Diagnosis not present

## 2014-03-08 DIAGNOSIS — I10 Essential (primary) hypertension: Secondary | ICD-10-CM

## 2014-03-08 NOTE — Patient Instructions (Signed)
Your physician wants you to follow-up in: 1 YEAR with Dr Cooper.  You will receive a reminder letter in the mail two months in advance. If you don't receive a letter, please call our office to schedule the follow-up appointment.  Your physician recommends that you continue on your current medications as directed. Please refer to the Current Medication list given to you today.  

## 2014-03-08 NOTE — Progress Notes (Signed)
HPI:  This is an 80 year old gentleman presenting for followup of coronary artery disease. The patient presented with unstable angina in 2005 and was treated with overlapping drug-eluting stents in the proximal LAD and a third stent in the distal LAD. He had no significant disease in his left circumflex or right coronary artery. The patient was last seen in October 2014. At that time he complained of left-sided chest pains. A nuclear scan was done in this demonstrated no evidence of ischemia. The patient had recent labs. This included a lipid panel: Lipid Panel     Component Value Date/Time   CHOL 158 02/06/2014 0843   TRIG 20.0 02/06/2014 0843   HDL 83.10 02/06/2014 0843   CHOLHDL 2 02/06/2014 0843   VLDL 4.0 02/06/2014 0843   LDLCALC 71 02/06/2014 0843   The patient had bronchitis 3 or 4 months ago. He's had a fairly prolonged recovery from this. He felt quite weak after taking a break from exercise. He is now back to exercising 4 or 5 days per week. He denies chest pain or pressure. He denies shortness of breath. His stamina is slowly building back up. He has no specific complaints today   Outpatient Encounter Prescriptions as of 03/08/2014  Medication Sig  . amLODipine (NORVASC) 5 MG tablet TAKE ONE TABLET BY MOUTH ONE TIME DAILY  . aspirin 81 MG tablet Take 81 mg by mouth daily.    . clopidogrel (PLAVIX) 75 MG tablet TAKE 1 TABLET BY MOUTH EVERY DAY  . CRESTOR 5 MG tablet TAKE ONE TABLET BY MOUTH DAILY ON MONDAY, WEDNESDAY AND FRIDAY  . fexofenadine (ALLEGRA) 180 MG tablet as needed.   . fluticasone (FLONASE) 50 MCG/ACT nasal spray PLACE TWO SPRAYS INTO THE NOSE AT BEDTIME  . Fluticasone-Salmeterol (ADVAIR DISKUS) 100-50 MCG/DOSE AEPB Inhale 1 puff into the lungs 2 (two) times daily as needed.  . montelukast (SINGULAIR) 10 MG tablet Take 1 tablet (10 mg total) by mouth at bedtime.  Marland Kitchen MYRBETRIQ 50 MG TB24 tablet Take 50 mg by mouth daily.   . nitroGLYCERIN (NITROSTAT) 0.4 MG SL tablet  Place 1 tablet (0.4 mg total) under the tongue every 5 (five) minutes as needed.  . ranitidine (ZANTAC) 150 MG tablet TAKE 1 TABLET BY MOUTH TWICE DAILY  . sucralfate (CARAFATE) 1 G tablet TAKE 1 TABLET BY MOUTH THREE TIMES DAILY WITH MEALS  . VIAGRA 100 MG tablet TAKE ONE-HALF(1/2) TO ONE(1) TABLET(S) ONCE DAILY AS NEEDED    Allergies  Allergen Reactions  . Hydromorphone Hcl      Dilaudid Rxed for renal calculi caused angioedema   . Zocor [Simvastatin - High Dose]     Elevated hepatic enzymes    Past Medical History  Diagnosis Date  . CAD (coronary artery disease)     a. s/p Cypher DES x 2 to prox LAD and Cypher DES x 1 to distal LAD 08/2009;  b. cath with residual D2 50% 10/05 and normal EF;  c. Myoview 6/11: EF 60%, mild fixed INF, inferoseptal, inferoapical thinning, no ischemia, low risk, EF 60%  . HLD (hyperlipidemia)     h/o elevated LFTs on Zocor  . GERD (gastroesophageal reflux disease)   . Asthma     post infectious  . Skin cancer, basal cell     Dr Sherrye Payor  . Nephrolithiasis   . Colon polyps   . Bladder cancer     Dr Estill Dooms, [facility]  . Other abnormal glucose 2012    FBS 104  ROS: Positive for joint aches, otherwise negative except as per HPI  BP 139/70  Pulse 54  Ht 5\' 6"  (1.676 m)  Wt 156 lb (70.761 kg)  BMI 25.19 kg/m2  PHYSICAL EXAM: Pt is alert and oriented, pleasant elderly male in NAD HEENT: normal Neck: JVP - normal, carotids 2+= without bruits Lungs: CTA bilaterally CV: RRR without murmur or gallop Abd: soft, NT, Positive BS, no hepatomegaly Ext: no C/C/E, distal pulses intact and equal Skin: warm/dry no rash  EKG:  Sinus bradycardia 54 beats per minute, within normal limits.  Myoview scan 08/31/2013: QPS  Raw Data Images: Normal; no motion artifact; normal heart/lung ratio.  Stress Images: Medium-sized, mild basal to apical inferior perfusion defect.  Rest Images: Medium-sized, mild basal to apical inferior perfusion defect.    Subtraction (SDS): Fixed, medium-sized mild basal to apical inferior perfusion defect.  Transient Ischemic Dilatation (Normal <1.22): 0.93  Lung/Heart Ratio (Normal <0.45): 0.33  Quantitative Gated Spect Images  QGS EDV: 94 ml  QGS ESV: 41 ml  Impression  Exercise Capacity: Fair exercise capacity.  BP Response: Normal blood pressure response.  Clinical Symptoms: Short of breath, fatigued.  ECG Impression: Insignificant upsloping ST segment depression.  Comparison with Prior Nuclear Study: No significant change from previous study  Overall Impression: Low risk stress nuclear study with a medium-sized, mild fixed basal to apical inferior perfusion defect. Given normal wall motion, This may be diaphragmatic attenuation. No evidence for ischemia. .  LV Ejection Fraction: 56%. LV Wall Motion: NL LV Function; NL Wall Motion   ASSESSMENT AND PLAN: 1. Coronary artery disease, native vessel. The patient had a low risk Myoview within the past 12 months. We will continue his current medical therapy which was reviewed today. This includes dual antiplatelet therapy with aspirin and clopidogrel, a statin drug, and amlodipine. He is not on a beta blocker because of bradycardia.  2. Essential hypertension. Blood pressure is controlled on amlodipine 5 mg daily.  3. Hyperlipidemia. Lipids reviewed as above. He continues on Crestor 5 mg 3 days weekly with dose limited by myalgias.  Sherren Mocha 03/08/2014 12:06 PM

## 2014-03-17 ENCOUNTER — Telehealth: Payer: Self-pay | Admitting: Internal Medicine

## 2014-03-17 NOTE — Telephone Encounter (Signed)
I am sorry but 3 docs out & I'm booked until after 5 pm. [facility] on [facility] is best option so data will be part of his chart

## 2014-03-17 NOTE — Telephone Encounter (Signed)
Patient Information:  Caller Name: Elmo  Phone: [phone]  Patient: [name] [name], [name] [name]  Gender: Male  DOB: 04-11-1929  Age: 80 Years  PCP: Unice Cobble  Office Follow Up:  Does the office need to follow up with this patient?: Yes  Instructions For The Office: Please review, contact patient at work   [phone].  RN Note:  Patient declines appointment with Dr Alain Marion at 1:15 pm, says only wanting to see Dr [name].  Asking if there is anyway Dr [name] can see him or treat over the phone.  Can reach patient at work  [phone]  Symptoms  Reason For Call & Symptoms: Has had abdominal pain in center abdoman just below waist since Tuesday 03/14/2014.  Some nausea but no vomiting or diarrhea.  Pain and soreness constant but varies in intensity.  If pressing, can't find specific spot of tenderness - may feel discomfort at a place other than where pressing.  If different pain than he has had before, describes mostly as dull and thinks it may have started after drinking tea or orange juice but not sure.  Up a lot last night due to the pain.  Reviewed Health History In EMR: Yes  Reviewed Medications In EMR: Yes  Reviewed Allergies In EMR: Yes  Reviewed Surgeries / Procedures: Yes  Date of Onset of Symptoms: 03/14/2014  Guideline(s) Used:  Abdominal Pain - Male  Disposition Per Guideline:   Go to ED Now (or to Office with PCP Approval)  Reason For Disposition Reached:   Constant abdominal pain lasting > 2 hours  Advice Given:  N/A  Patient Refused Recommendation:  Patient Requests Prescription  Wanting to be seen only  by Dr [name] or treated by same over the phone

## 2014-03-17 NOTE — Telephone Encounter (Signed)
Spoke with facility, advised of MDs message.  Transferred to scheduling

## 2014-03-18 ENCOUNTER — Ambulatory Visit: Payer: Medicare Other | Admitting: Family Medicine

## 2014-03-20 ENCOUNTER — Other Ambulatory Visit (INDEPENDENT_AMBULATORY_CARE_PROVIDER_SITE_OTHER): Payer: Medicare Other

## 2014-03-20 ENCOUNTER — Encounter: Payer: Self-pay | Admitting: Internal Medicine

## 2014-03-20 ENCOUNTER — Ambulatory Visit (INDEPENDENT_AMBULATORY_CARE_PROVIDER_SITE_OTHER): Payer: Medicare Other | Admitting: Internal Medicine

## 2014-03-20 VITALS — BP 116/60 | HR 66 | Temp 97.4°F | Resp 14 | Wt 155.0 lb

## 2014-03-20 DIAGNOSIS — I2510 Atherosclerotic heart disease of native coronary artery without angina pectoris: Secondary | ICD-10-CM | POA: Diagnosis not present

## 2014-03-20 DIAGNOSIS — R102 Pelvic and perineal pain: Secondary | ICD-10-CM

## 2014-03-20 DIAGNOSIS — R10814 Left lower quadrant abdominal tenderness: Secondary | ICD-10-CM

## 2014-03-20 DIAGNOSIS — R109 Unspecified abdominal pain: Secondary | ICD-10-CM

## 2014-03-20 LAB — CBC WITH DIFFERENTIAL/PLATELET
BASOS PCT: 0.3 % (ref 0.0–3.0)
Basophils Absolute: 0 10*3/uL (ref 0.0–0.1)
EOS ABS: 0.2 10*3/uL (ref 0.0–0.7)
EOS PCT: 1.5 % (ref 0.0–5.0)
HCT: 40 % (ref 39.0–52.0)
Hemoglobin: 13.7 g/dL (ref 13.0–17.0)
LYMPHS PCT: 16.2 % (ref 12.0–46.0)
Lymphs Abs: 1.6 10*3/uL (ref 0.7–4.0)
MCHC: 34.2 g/dL (ref 30.0–36.0)
MCV: 95.4 fl (ref 78.0–100.0)
MONOS PCT: 8.6 % (ref 3.0–12.0)
Monocytes Absolute: 0.9 10*3/uL (ref 0.1–1.0)
NEUTROS PCT: 73.4 % (ref 43.0–77.0)
Neutro Abs: 7.3 10*3/uL (ref 1.4–7.7)
Platelets: 178 10*3/uL (ref 150.0–400.0)
RBC: 4.19 Mil/uL — AB (ref 4.22–5.81)
RDW: 13.3 % (ref 11.5–14.6)
WBC: 9.9 10*3/uL (ref 4.5–10.5)

## 2014-03-20 LAB — URINALYSIS
Hgb urine dipstick: NEGATIVE
Ketones, ur: NEGATIVE
Leukocytes, UA: NEGATIVE
Nitrite: NEGATIVE
PH: 6 (ref 5.0–8.0)
Specific Gravity, Urine: >=1.03 — AB (ref 1.000–1.030)
TOTAL PROTEIN, URINE-UPE24: NEGATIVE
UROBILINOGEN UA: 0.2 (ref 0.0–1.0)
Urine Glucose: NEGATIVE

## 2014-03-20 NOTE — Progress Notes (Signed)
   Subjective:    Patient ID: [name] [name], male    DOB: 1929/08/28, 80 y.o.   MRN: [id number]  HPI   Symptoms began 03/14/14 as pain in the suprapubic area 30 minutes after ingesting a sausage biscuit and drinking orange juice. It had been nagging and intermittent lasting up to 30 minutes. Rest did improve the pain.  Over the last 6 days it has become more constant than intermittent. He has had associated constipation until he had a bowel movement today which was normal.  He's had associated nausea and anorexia. Dyspepsia is not major.  His last colonoscopy was 2005. PMH of bladder cancer  Review of Systems  He specifically denies dysphagia, hematemesis, weight loss, fever, chills, or sweats.  He has no dysuria, pyuria, or hematuria.  There is no associated pain in the back which radiates anteriorly.        Objective:   Physical Exam Gen.: Healthy and well-nourished in appearance. Alert, appropriate and cooperative throughout exam. Appears younger than stated age  Head: Normocephalic without obvious abnormalities;pattern alopecia  Eyes: No corneal or conjunctival inflammation noted. No icterus. Nose: External nasal exam reveals no deformity or inflammation.  No lesions or exudates noted.   There is erythema of the right  nasal septum Mouth: Oral mucosa and oropharynx reveal no lesions or exudates. Upper dental plate Neck: No deformities, masses, or tenderness noted.  Thyroid normal. Lungs: Normal respiratory effort; chest expands symmetrically. Lungs are clear to auscultation without rales, wheezes, or increased work of breathing. Heart:  Heart rhythm is slow but regular.Normal S1 and S2. No gallop, click, or rub. No murmur. Abdomen: Bowel sounds normal; abdomen soft .  Bowel sounds are decreased. His abdomen is soft without organomegaly or masses. He has tenderness in the left lower quadrant.No masses, organomegaly or hernias noted. Genitalia: Genitalia normal except for  left varices. Prostate is mildly enlarged on R w/o  nodularity or induration  . FOB negative                                  Musculoskeletal/extremities: No deformity or scoliosis noted of  the thoracic or lumbar spine.    No clubbing, cyanosis, edema, or significant extremity  deformity noted. Range of motion normal .Tone & strength normal.  Able to lie down & sit up w/o help. Negative SLR bilaterally Vascular: Carotid, radial artery, dorsalis pedis and  posterior tibial pulses are full and equal. No bruits present. Neurologic: Alert and oriented x3. Deep tendon reflexes symmetrical and normal.  Gait normal .Skin: Intact without suspicious lesions or rashes.  . There is slight tenting of the skin.  Lymph: No cervical, axillary, or inguinal lymphadenopathy present. Psych: Mood and affect are normal. Normally interactive                                                                                        Assessment & Plan:  # 1suprapubic / LLQ pain #2 PMH bladder cancer See orders

## 2014-03-20 NOTE — Patient Instructions (Signed)
Stay on clear liquids for 48-72 hours or until pain gone.This would include  jello, sherbert (NOT ice cream), Lipton's chicken noodle soup(NOT cream based soups),Gatorade Lite, flat Ginger ale (without High Fructose Corn Syrup),dry toast or crackers, baked potato.No milk , dairy or grease. Report increasing pain, fever or rectal bleeding

## 2014-03-20 NOTE — Progress Notes (Signed)
Pre visit review using our clinic review tool, if applicable. No additional management support is needed unless otherwise documented below in the visit note. 

## 2014-03-22 ENCOUNTER — Encounter: Payer: Self-pay | Admitting: Internal Medicine

## 2014-03-22 ENCOUNTER — Other Ambulatory Visit: Payer: Self-pay | Admitting: Internal Medicine

## 2014-03-29 DIAGNOSIS — H6120 Impacted cerumen, unspecified ear: Secondary | ICD-10-CM | POA: Diagnosis not present

## 2014-05-08 ENCOUNTER — Other Ambulatory Visit: Payer: Self-pay | Admitting: Cardiovascular Disease

## 2014-05-12 DIAGNOSIS — H04129 Dry eye syndrome of unspecified lacrimal gland: Secondary | ICD-10-CM | POA: Diagnosis not present

## 2014-05-12 DIAGNOSIS — Z961 Presence of intraocular lens: Secondary | ICD-10-CM | POA: Diagnosis not present

## 2014-05-12 DIAGNOSIS — D239 Other benign neoplasm of skin, unspecified: Secondary | ICD-10-CM | POA: Diagnosis not present

## 2014-06-20 ENCOUNTER — Other Ambulatory Visit: Payer: Self-pay | Admitting: Internal Medicine

## 2014-06-29 ENCOUNTER — Other Ambulatory Visit: Payer: Self-pay | Admitting: Internal Medicine

## 2014-07-14 ENCOUNTER — Other Ambulatory Visit: Payer: Self-pay | Admitting: Cardiovascular Disease

## 2014-08-23 ENCOUNTER — Other Ambulatory Visit: Payer: Self-pay | Admitting: Internal Medicine

## 2014-08-23 NOTE — Telephone Encounter (Signed)
OK X1 

## 2014-09-04 DIAGNOSIS — H6123 Impacted cerumen, bilateral: Secondary | ICD-10-CM | POA: Diagnosis not present

## 2014-10-10 ENCOUNTER — Ambulatory Visit: Payer: Medicare Other

## 2014-10-10 ENCOUNTER — Ambulatory Visit (INDEPENDENT_AMBULATORY_CARE_PROVIDER_SITE_OTHER): Payer: Medicare Other | Admitting: *Deleted

## 2014-10-10 DIAGNOSIS — Z23 Encounter for immunization: Secondary | ICD-10-CM | POA: Diagnosis not present

## 2014-11-01 DIAGNOSIS — C679 Malignant neoplasm of bladder, unspecified: Secondary | ICD-10-CM | POA: Diagnosis not present

## 2014-11-01 DIAGNOSIS — R3915 Urgency of urination: Secondary | ICD-10-CM | POA: Diagnosis not present

## 2014-11-16 ENCOUNTER — Other Ambulatory Visit: Payer: Self-pay | Admitting: Internal Medicine

## 2014-11-16 ENCOUNTER — Other Ambulatory Visit: Payer: Self-pay | Admitting: Cardiovascular Disease

## 2014-12-27 ENCOUNTER — Ambulatory Visit (INDEPENDENT_AMBULATORY_CARE_PROVIDER_SITE_OTHER): Payer: Medicare Other | Admitting: Internal Medicine

## 2014-12-27 ENCOUNTER — Ambulatory Visit (INDEPENDENT_AMBULATORY_CARE_PROVIDER_SITE_OTHER)
Admission: RE | Admit: 2014-12-27 | Discharge: 2014-12-27 | Disposition: A | Discharge status: Home / Self Care | Payer: Medicare Other | Source: Ambulatory Visit | Attending: Internal Medicine | Admitting: Internal Medicine

## 2014-12-27 ENCOUNTER — Other Ambulatory Visit (INDEPENDENT_AMBULATORY_CARE_PROVIDER_SITE_OTHER): Payer: Medicare Other

## 2014-12-27 ENCOUNTER — Encounter: Payer: Self-pay | Admitting: Internal Medicine

## 2014-12-27 VITALS — BP 146/76 | HR 67 | Temp 97.7°F | Ht 66.0 in | Wt 158.5 lb

## 2014-12-27 DIAGNOSIS — J4521 Mild intermittent asthma with (acute) exacerbation: Secondary | ICD-10-CM

## 2014-12-27 DIAGNOSIS — J449 Chronic obstructive pulmonary disease, unspecified: Secondary | ICD-10-CM | POA: Diagnosis not present

## 2014-12-27 DIAGNOSIS — K219 Gastro-esophageal reflux disease without esophagitis: Secondary | ICD-10-CM | POA: Diagnosis not present

## 2014-12-27 DIAGNOSIS — J310 Chronic rhinitis: Secondary | ICD-10-CM | POA: Diagnosis not present

## 2014-12-27 LAB — CBC WITH DIFFERENTIAL/PLATELET
Basophils Absolute: 0 10*3/uL (ref 0.0–0.1)
Basophils Relative: 0.3 % (ref 0.0–3.0)
EOS ABS: 0.2 10*3/uL (ref 0.0–0.7)
Eosinophils Relative: 3.3 % (ref 0.0–5.0)
HCT: 41.9 % (ref 39.0–52.0)
Hemoglobin: 14.5 g/dL (ref 13.0–17.0)
Lymphocytes Relative: 19.8 % (ref 12.0–46.0)
Lymphs Abs: 1.4 10*3/uL (ref 0.7–4.0)
MCHC: 34.7 g/dL (ref 30.0–36.0)
MCV: 95.4 fl (ref 78.0–100.0)
Monocytes Absolute: 0.8 10*3/uL (ref 0.1–1.0)
Monocytes Relative: 11.2 % (ref 3.0–12.0)
NEUTROS PCT: 65.4 % (ref 43.0–77.0)
Neutro Abs: 4.5 10*3/uL (ref 1.4–7.7)
Platelets: 208 10*3/uL (ref 150.0–400.0)
RBC: 4.4 Mil/uL (ref 4.22–5.81)
RDW: 13.2 % (ref 11.5–15.5)
WBC: 6.8 10*3/uL (ref 4.0–10.5)

## 2014-12-27 MED ORDER — PREDNISONE 20 MG PO TABS
20.0000 mg | ORAL_TABLET | Freq: Two times a day (BID) | ORAL | Status: DC
Start: 2014-12-27 — End: 2015-06-18

## 2014-12-27 MED ORDER — AZITHROMYCIN 250 MG PO TABS: ORAL_TABLET | ORAL | Status: DC

## 2014-12-27 NOTE — Progress Notes (Signed)
Pre visit review using our clinic review tool, if applicable. No additional management support is needed unless otherwise documented below in the visit note. 

## 2014-12-27 NOTE — Patient Instructions (Signed)
Plain Mucinex (NOT D) for thick secretions ;force NON dairy fluids .   Nasal cleansing in the shower as discussed with lather of mild shampoo.After 10 seconds wash off lather while  exhaling through nostrils. Make sure that all residual soap is removed to prevent irritation.  Flonase OR Nasacort AQ 1 spray in each nostril twice a day as needed. Use the "crossover" technique into opposite nostril spraying toward opposite ear @ 45 degree angle, not straight up into nostril.  Plain Allegra (NOT D )  160 daily , Loratidine 10 mg , OR Zyrtec 10 mg @ bedtime  as needed for itchy eyes & sneezing.  Reflux of gastric acid may be asymptomatic as this may occur mainly during sleep.The triggers for reflux  include stress; the "aspirin family" ; alcohol; peppermint; and caffeine (coffee, tea, cola, and chocolate). The aspirin family would include aspirin and the nonsteroidal agents such as ibuprofen &  Naproxen. Tylenol would not cause reflux. If having symptoms ; food & drink should be avoided for @ least 2 hours before going to bed.  

## 2014-12-27 NOTE — Progress Notes (Signed)
   Subjective:    Patient ID: [name] [name], male    DOB: 04/28/1929, 81 y.o.   MRN: [id number]  HPI He's had a cough for 4 weeks which is nonproductive. It is associated with chest tightness. It is worse in the morning with "cats purring(in chest)" but does improve somewhat during the day. He does have some postnasal drainage.  He's had increase in his reflux symptoms with increased dyspepsia and some globus sensation despite compliance with an H2 blocker and Carafate. This globus sensation has been present for the last several days.No frank dysphagia described.  He's been using Allegra  & Vick's vapor rub without benefit  There is history of asthma; he has been using his Advair over the past few days. He's not been able to find his albuterol.  He denies any upper respiratory tract infection symptoms.  Review of Systems Frontal headache, facial pain , nasal purulence, dental pain, sore throat , otic pain or otic discharge denied. No fever , chills or sweats.    Objective:   Physical Exam  General appearance:Pattern alopecia.Adequately nourished; no acute distress or increased work of breathing is present.  No  lymphadenopathy about the head, neck, or axilla noted.   Eyes: No conjunctival inflammation or lid edema is present. There is no scleral icterus.  Ears:  External ear exam shows no significant lesions or deformities.  Otoscopic examination reveals slight increase in ear wax but tympanic membranes are intact bilaterally without bulging, retraction, inflammation or discharge.Decreased hearing  Nose:  External nasal examination shows no deformity or inflammation. Nasal mucosa are erythematous without lesions or exudates. No septal dislocation or deviation.No obstruction to airflow.   Oral exam: Upper plate; lips and gums are healthy appearing.There is no oropharyngeal erythema or exudate noted.   Neck:  No deformities, thyromegaly, masses, or tenderness noted.   Supple with  full range of motion without pain.   Heart:  Normal rate and regular rhythm. S1 and S2 normal without gallop, murmur, click, rub or other extra sounds.   Lungs:Chest clear to auscultation; no wheezes, rhonchi,rales ,or rubs present.  Abdomen: bowel sounds normal, soft and non-tender without masses, organomegaly or hernias noted.  No guarding or rebound  Extremities:  No cyanosis, edema, or clubbing  noted   Neuro:slightly unsteady , broad gait   Skin: Warm & dry w/o jaundice or tenting.        Assessment & Plan:  #1 asthmatic bronchitis #2 non allergic rhinitis #3 GERD See orders

## 2014-12-29 ENCOUNTER — Other Ambulatory Visit: Payer: Self-pay | Admitting: Internal Medicine

## 2014-12-30 ENCOUNTER — Other Ambulatory Visit: Payer: Self-pay | Admitting: Internal Medicine

## 2015-01-01 NOTE — Telephone Encounter (Signed)
OK X1 

## 2015-01-24 ENCOUNTER — Telehealth: Payer: Self-pay | Admitting: Internal Medicine

## 2015-01-24 NOTE — Telephone Encounter (Signed)
Pt request lab result that was done on 2.10.16.

## 2015-01-25 NOTE — Telephone Encounter (Signed)
Phone call to patient. He has been advised that cbc from 12/27/14 is normal.

## 2015-01-29 DIAGNOSIS — H6123 Impacted cerumen, bilateral: Secondary | ICD-10-CM | POA: Diagnosis not present

## 2015-02-17 ENCOUNTER — Other Ambulatory Visit: Payer: Self-pay | Admitting: Cardiovascular Disease

## 2015-03-27 ENCOUNTER — Other Ambulatory Visit: Payer: Self-pay | Admitting: Cardiovascular Disease

## 2015-05-08 ENCOUNTER — Other Ambulatory Visit: Payer: Self-pay | Admitting: Cardiovascular Disease

## 2015-05-10 ENCOUNTER — Other Ambulatory Visit: Payer: Self-pay

## 2015-05-10 MED ORDER — NITROGLYCERIN 0.4 MG SL SUBL: 0.4000 mg | SUBLINGUAL_TABLET | SUBLINGUAL | Status: DC | PRN

## 2015-05-14 ENCOUNTER — Other Ambulatory Visit: Payer: Self-pay

## 2015-05-15 DIAGNOSIS — H6123 Impacted cerumen, bilateral: Secondary | ICD-10-CM | POA: Diagnosis not present

## 2015-05-15 DIAGNOSIS — J310 Chronic rhinitis: Secondary | ICD-10-CM | POA: Diagnosis not present

## 2015-05-15 DIAGNOSIS — R0982 Postnasal drip: Secondary | ICD-10-CM | POA: Diagnosis not present

## 2015-05-16 DIAGNOSIS — N401 Enlarged prostate with lower urinary tract symptoms: Secondary | ICD-10-CM | POA: Diagnosis not present

## 2015-05-16 DIAGNOSIS — C679 Malignant neoplasm of bladder, unspecified: Secondary | ICD-10-CM | POA: Diagnosis not present

## 2015-06-08 ENCOUNTER — Encounter: Payer: Self-pay | Admitting: *Deleted

## 2015-06-08 DIAGNOSIS — Z961 Presence of intraocular lens: Secondary | ICD-10-CM | POA: Diagnosis not present

## 2015-06-08 DIAGNOSIS — D239 Other benign neoplasm of skin, unspecified: Secondary | ICD-10-CM | POA: Diagnosis not present

## 2015-06-14 ENCOUNTER — Other Ambulatory Visit: Payer: Self-pay | Admitting: Cardiovascular Disease

## 2015-06-18 ENCOUNTER — Encounter: Payer: Self-pay | Admitting: Physician Assistant

## 2015-06-18 ENCOUNTER — Ambulatory Visit (INDEPENDENT_AMBULATORY_CARE_PROVIDER_SITE_OTHER): Payer: Medicare Other | Admitting: Physician Assistant

## 2015-06-18 VITALS — BP 138/60 | HR 64 | Ht 66.0 in | Wt 153.0 lb

## 2015-06-18 DIAGNOSIS — E785 Hyperlipidemia, unspecified: Secondary | ICD-10-CM

## 2015-06-18 DIAGNOSIS — I2510 Atherosclerotic heart disease of native coronary artery without angina pectoris: Secondary | ICD-10-CM

## 2015-06-18 DIAGNOSIS — I10 Essential (primary) hypertension: Secondary | ICD-10-CM | POA: Diagnosis not present

## 2015-06-18 MED ORDER — ROSUVASTATIN CALCIUM 5 MG PO TABS: ORAL_TABLET | ORAL | Status: DC

## 2015-06-18 NOTE — Assessment & Plan Note (Signed)
Blood pressure controlled. Continue Norvasc.

## 2015-06-18 NOTE — Progress Notes (Signed)
Cardiology Office Note   Date:  06/18/2015   ID:  [name] [name], DOB 1929-05-15, MRN [id number]  PCP:  Unice Cobble, MD  Cardiologist:  Dr. Burt Knack  Chief Complaint: leg pain, balance issues    History of Present Illness: [name] [name] is a 81 y.o. male who presents for one-year follow-up. He has history of CAD status post overlapping drug-eluting stents to the proximal LAD and a third stent in the distal LAD in 2005. He has no significant disease in his left circumflex or RCA. Nuclear scan in October 2014 showed no evidence of ischemia. He has been maintained on Plavix, statin, and amlodipine. He has not been on a beta blocker because of bradycardia. He also has hypertension and hyperlipidemia.   Patient stopped taking his Crestor about 5 or 6 months ago because he was having leg pains and balance issues. These symptoms have gotten minimally better but not significantly improved. He actually stopped playing tennis because he was worried he was going to fall. He says he feels weak in his legs and hands to get strength back. He like to start playing tennis again.  He also has chronic aches on the left side of his chest associated with food intake but sometimes occurs at rest. He's had this for years and it has not changed. He denies any chest tightness, heaviness, pressure, dyspnea, dyspnea on exertion, dizziness or presyncope.   Past Medical History  Diagnosis Date  . CAD (coronary artery disease)     a. s/p Cypher DES x 2 to prox LAD and Cypher DES x 1 to distal LAD 08/2009;  b. cath with residual D2 50% 10/05 and normal EF;  c. Myoview 6/11: EF 60%, mild fixed INF, inferoseptal, inferoapical thinning, no ischemia, low risk, EF 60%  . HLD (hyperlipidemia)     h/o elevated LFTs on Zocor  . GERD (gastroesophageal reflux disease)   . Asthma     post infectious  . Skin cancer, basal cell     Dr Sherrye Payor  . Nephrolithiasis   . Colon polyps   . Bladder cancer     Dr Estill Dooms, [facility]  . Other abnormal glucose 2012    FBS 104    Past Surgical History  Procedure Laterality Date  . Coronary artery stenting  2005    Dr. Olevia Perches, 3 vessel  . Lithotripsy  2002  . Colonoscopy with polypectomy  2005    Dr. Verl Blalock  . Upper gastrointestinal endoscopy  2005    Esophageal reflux, hiatal hernia  . Bladder tumor excision  2011, 2012, 2013    Dr Estill Dooms, [facility]  . Cataract surgery  2003    Bilateral, [facility] Op[facility]     Current Outpatient Prescriptions  Medication Sig Dispense Refill  . ADVAIR DISKUS 100-50 MCG/DOSE AEPB TAKE 1 PUFF BY MOUTH TWICE DAILY AS NEEDED 60 each 5  . amLODipine (NORVASC) 5 MG tablet TAKE 1 TABLET BY MOUTH EVERY DAY 30 tablet 0  . aspirin 81 MG tablet Take 81 mg by mouth daily.      . clopidogrel (PLAVIX) 75 MG tablet TAKE 1 TABLET BY MOUTH DAILY 30 tablet 6  . fexofenadine (ALLEGRA) 180 MG tablet Take 180 mg by mouth as needed for allergies or rhinitis.     . fluticasone (FLONASE) 50 MCG/ACT nasal spray PLACE TWO SPRAYS INTO THE NOSE AT BEDTIME 16 g 5  . MYRBETRIQ 50 MG TB24 tablet Take 50 mg by mouth daily.     Marland Kitchen  nitroGLYCERIN (NITROSTAT) 0.4 MG SL tablet Place 1 tablet (0.4 mg total) under the tongue every 5 (five) minutes as needed. 25 tablet 3  . ranitidine (ZANTAC) 150 MG tablet TAKE 1 TABLET BY MOUTH TWICE DAILY 180 tablet 1  . sucralfate (CARAFATE) 1 G tablet TAKE 1 TABLET BY MOUTH THREE TIMES DAILY WITH MEALS 270 tablet 1  . VIAGRA 100 MG tablet TAKE ONE-HALF TO 1 TABLET BY MOUTH ONCE DAILY AS NEEDED 6 tablet 0   No current facility-administered medications for this visit.    Allergies:   Hydromorphone hcl; Phenytoin; Zocor; Hydromorphone hcl; and Statins    Social History:  The patient  reports that he quit smoking about 54 years ago. His smoking use included Cigarettes. He does not have any smokeless tobacco history on file. He reports that he drinks about 6.0 oz of alcohol per week. He reports that he does not use illicit  drugs.   Family History:  The patient's    family history includes Alcohol abuse in his brother; Asthma in his brother; Coronary artery disease in his father; Diabetes in his brother; Heart attack in his mother; Heart disease in his paternal grandmother; Hypertension in his mother; Prostate cancer in his maternal grandfather; Stroke in his mother.    ROS:  Please see the history of present illness.   Otherwise, review of systems are positive for none.   All other systems are reviewed and negative.    PHYSICAL EXAM: VS:  BP 138/60 mmHg  Pulse 64  Ht 5\' 6"  (1.676 m)  Wt 153 lb (69.4 kg)  BMI 24.71 kg/m2 , BMI Body mass index is 24.71 kg/(m^2). GEN: Well nourished, well developed, in no acute distress Neck: no JVD, HJR, carotid bruits, or masses Cardiac:  RRR; no murmurs,gallop, rubs, thrill or heave,  Respiratory:  clear to auscultation bilaterally, normal work of breathing GI: soft, nontender, nondistended, + BS MS: no deformity or atrophy Extremities: without cyanosis, clubbing, edema, good distal pulses bilaterally.  Skin: warm and dry, no rash Neuro:  Strength and sensation are intact    EKG:  EKG is ordered today. The ekg ordered today demonstrates normal sinus rhythm with first-degree AV block PAC, no acute change   Recent Labs: 12/27/2014: Hemoglobin 14.5; Platelets 208.0    Lipid Panel    Component Value Date/Time   CHOL 158 02/06/2014 0843   TRIG 20.0 02/06/2014 0843   TRIG 39 10/21/2006 1059   HDL 83.10 02/06/2014 0843   CHOLHDL 2 02/06/2014 0843   CHOLHDL 2.3 CALC 10/21/2006 1059   VLDL 4.0 02/06/2014 0843   LDLCALC 71 02/06/2014 0843      Wt Readings from Last 3 Encounters:  06/18/15 153 lb (69.4 kg)  12/27/14 158 lb 8 oz (71.895 kg)  03/20/14 155 lb (70.308 kg)      Other studies Reviewed: Additional studies/ records that were reviewed today include and review of the records demonstrates:  Myoview scan 08/31/2013: QPS  Raw Data Images: Normal; no  motion artifact; normal heart/lung ratio.   Stress Images: Medium-sized, mild basal to apical inferior perfusion defect.   Rest Images: Medium-sized, mild basal to apical inferior perfusion defect.   Subtraction (SDS): Fixed, medium-sized mild basal to apical inferior perfusion defect.   Transient Ischemic Dilatation (Normal <1.22): 0.93   Lung/Heart Ratio (Normal <0.45): 0.33   Quantitative Gated Spect Images   QGS EDV: 94 ml   QGS ESV: 41 ml   Impression  Exercise Capacity: Fair exercise capacity.  BP Response: Normal blood pressure response.   Clinical Symptoms: Short of breath, fatigued.   ECG Impression: Insignificant upsloping ST segment depression.   Comparison with Prior Nuclear Study: No significant change from previous study   Overall Impression: Low risk stress nuclear study with a medium-sized, mild fixed basal to apical inferior perfusion defect. Given normal wall motion, This may be diaphragmatic attenuation. No evidence for ischemia. .   LV Ejection Fraction: 56%. LV Wall Motion: NL LV Function; NL Wall Motion    CONCLUSION:  1.  Coronary artery disease, with 80% narrowing in the proximal LAD, 99%      narrowing in the mid-LAD, with TIMI I flow distally, 80% narrowing in      the distal LAD, and 50% narrowing in the second diagonal branch of the      LAD, normal circumflex and right coronary arteries, with normal LV      function.  2.  Successful stenting of the lesion in the distal LAD with a Cypher drug-      eluting stent, with improvement in narrowing from 80% to 0%.  3.  Successful placement of tandem overlapping Cypher drug-eluting stents in      the proximal and mid-LAD, with improving in the mid stenosis from 99% to      0, and improvement in the proximal stenosis from 80% to 0, and cutting      balloon angioplasty of the diagonal branch of the LAD, with residual 50%      stenosis but good flow (branch stenosis).   DISPOSITION:  The patient will return to the  postanesthesia care unit for  further observation.         ASSESSMENT AND PLAN: CAD (coronary artery disease) Patient is stable from a cardiac standpoint. He denies any exertional symptoms. He'd like to start playing tennis again. I told him from a heart standpoint he is stable to play. I told him he may want to do some physical therapy to help gain strengthen his legs again and help prevent him from falling. Recommend follow-up with Dr. [name] for leg pain and balance issues. Resume Crestor. Continue Plavix and Norvasc. Follow-up with Dr. Burt Knack in 6 months.  Essential hypertension, benign Blood pressure controlled. Continue Norvasc.  HYPERLIPIDEMIA Patient stopped his Crestor because he thought was causing his leg pain and balance issues. This has not improved significantly over the past 5 or 6 months since he stopped the Crestor. He is willing to go back on this. Resume Crestor 5 mg Monday Wednesday Friday. Follow-up lipid panel and LFTs in 6 weeks.     Sumner Boast, PA-C  06/18/2015 12:36 PM    [facility] [place], [place], [place]  [address] Phone: [phone]; Fax: [phone]

## 2015-06-18 NOTE — Assessment & Plan Note (Signed)
Patient stopped his Crestor because he thought was causing his leg pain and balance issues. This has not improved significantly over the past 5 or 6 months since he stopped the Crestor. He is willing to go back on this. Resume Crestor 5 mg Monday Wednesday Friday. Follow-up lipid panel and LFTs in 6 weeks.

## 2015-06-18 NOTE — Patient Instructions (Signed)
Medication Instructions:  1) RESUME taking Crestor (Rosuvastatin) 5mg  once daily on Monday, Wednesday and Friday  Labwork: Lipids and LFTs in 6 weeks  Testing/Procedures: None  Follow-Up: Your physician wants you to follow-up in: 6 months with Dr. Burt Knack. You will receive a reminder letter in the mail two months in advance. If you don't receive a letter, please call our office to schedule the follow-up appointment.   Any Other Special Instructions Will Be Listed Below (If Applicable).

## 2015-06-18 NOTE — Assessment & Plan Note (Addendum)
Patient is stable from a cardiac standpoint. He denies any exertional symptoms. He'd like to start playing tennis again. I told him from a heart standpoint he is stable to play. I told him he may want to do some physical therapy to help gain strengthen his legs again and help prevent him from falling. Recommend follow-up with Dr. [name] for leg pain and balance issues. Resume Crestor. Continue Plavix and Norvasc. Follow-up with Dr. Burt Knack in 6 months.

## 2015-06-25 ENCOUNTER — Other Ambulatory Visit: Payer: Self-pay | Admitting: Cardiovascular Disease

## 2015-07-04 ENCOUNTER — Encounter: Payer: Self-pay | Admitting: Gastroenterology

## 2015-07-09 DIAGNOSIS — M19042 Primary osteoarthritis, left hand: Secondary | ICD-10-CM | POA: Diagnosis not present

## 2015-07-09 DIAGNOSIS — M79641 Pain in right hand: Secondary | ICD-10-CM | POA: Diagnosis not present

## 2015-07-09 DIAGNOSIS — M19041 Primary osteoarthritis, right hand: Secondary | ICD-10-CM | POA: Diagnosis not present

## 2015-07-18 ENCOUNTER — Other Ambulatory Visit: Payer: Self-pay | Admitting: Cardiovascular Disease

## 2015-07-30 ENCOUNTER — Telehealth: Payer: Self-pay | Admitting: *Deleted

## 2015-07-30 ENCOUNTER — Other Ambulatory Visit: Payer: Self-pay | Admitting: Emergency Medicine

## 2015-07-30 ENCOUNTER — Other Ambulatory Visit (INDEPENDENT_AMBULATORY_CARE_PROVIDER_SITE_OTHER): Payer: Medicare Other | Admitting: *Deleted

## 2015-07-30 DIAGNOSIS — E785 Hyperlipidemia, unspecified: Secondary | ICD-10-CM

## 2015-07-30 LAB — HEPATIC FUNCTION PANEL
ALK PHOS: 116 U/L (ref 39–117)
ALT: 20 U/L (ref 0–53)
AST: 20 U/L (ref 0–37)
Albumin: 4.1 g/dL (ref 3.5–5.2)
BILIRUBIN DIRECT: 0.2 mg/dL (ref 0.0–0.3)
BILIRUBIN TOTAL: 0.8 mg/dL (ref 0.2–1.2)
TOTAL PROTEIN: 6.4 g/dL (ref 6.0–8.3)

## 2015-07-30 LAB — LIPID PANEL
CHOL/HDL RATIO: 2
CHOLESTEROL: 155 mg/dL (ref 0–200)
HDL: 68.7 mg/dL (ref >39.00)
LDL Cholesterol: 78 mg/dL (ref 0–99)
NonHDL: 86.55
TRIGLYCERIDES: 41 mg/dL (ref 0.0–149.0)
VLDL: 8.2 mg/dL (ref 0.0–40.0)

## 2015-07-30 MED ORDER — RANITIDINE HCL 150 MG PO TABS: 150.0000 mg | ORAL_TABLET | Freq: Two times a day (BID) | ORAL | Status: DC

## 2015-07-30 NOTE — Addendum Note (Signed)
Addended by: Eulis Foster on: 07/30/2015 07:36 AM   Modules accepted: Orders

## 2015-07-30 NOTE — Telephone Encounter (Signed)
Called pt to make him aware that his lipid and lft panel was fine and that Selinda Eon just advised to continue the Crestor, and that she wasn't going to be making any changes at this time.  Pt verbalized understanding.Marland Kitchen

## 2015-07-31 DIAGNOSIS — H6123 Impacted cerumen, bilateral: Secondary | ICD-10-CM | POA: Diagnosis not present

## 2015-08-11 DIAGNOSIS — B351 Tinea unguium: Secondary | ICD-10-CM | POA: Diagnosis not present

## 2015-09-11 ENCOUNTER — Ambulatory Visit: Payer: Medicare Other | Admitting: Internal Medicine

## 2015-09-14 ENCOUNTER — Encounter: Payer: Self-pay | Admitting: Internal Medicine

## 2015-09-14 ENCOUNTER — Ambulatory Visit (INDEPENDENT_AMBULATORY_CARE_PROVIDER_SITE_OTHER): Payer: Medicare Other | Admitting: Internal Medicine

## 2015-09-14 VITALS — BP 134/62 | HR 70 | Temp 98.3°F | Ht 66.0 in | Wt 150.0 lb

## 2015-09-14 DIAGNOSIS — Z87898 Personal history of other specified conditions: Secondary | ICD-10-CM | POA: Diagnosis not present

## 2015-09-14 DIAGNOSIS — R2689 Other abnormalities of gait and mobility: Secondary | ICD-10-CM | POA: Diagnosis not present

## 2015-09-14 DIAGNOSIS — Z23 Encounter for immunization: Secondary | ICD-10-CM | POA: Diagnosis not present

## 2015-09-14 DIAGNOSIS — M791 Myalgia: Secondary | ICD-10-CM

## 2015-09-14 DIAGNOSIS — R251 Tremor, unspecified: Secondary | ICD-10-CM | POA: Diagnosis not present

## 2015-09-14 DIAGNOSIS — M609 Myositis, unspecified: Secondary | ICD-10-CM

## 2015-09-14 DIAGNOSIS — I10 Essential (primary) hypertension: Secondary | ICD-10-CM

## 2015-09-14 DIAGNOSIS — IMO0001 Reserved for inherently not codable concepts without codable children: Secondary | ICD-10-CM

## 2015-09-14 DIAGNOSIS — I2510 Atherosclerotic heart disease of native coronary artery without angina pectoris: Secondary | ICD-10-CM

## 2015-09-14 DIAGNOSIS — B351 Tinea unguium: Secondary | ICD-10-CM | POA: Diagnosis not present

## 2015-09-14 NOTE — Patient Instructions (Signed)
Blow toenail dry with hair drier. The Neurology  referral will be scheduled and you'll be notified of the time.Please call the Referral Co-Ordinator @ [phone] if you have not been notified of appointment time within 7-10 days.  Your next office appointment will be determined based upon review of your pending labs .  Those written interpretation of the lab results and instructions will be transmitted to you by My Chart   Critical results will be called.   Followup as needed for any active or acute issue. Please report any significant change in your symptoms.

## 2015-09-14 NOTE — Progress Notes (Signed)
Subjective:    Patient ID: [name] [name], male    DOB: 09-07-1929, 81 y.o.   MRN: [id number]  HPI  He is here with multiple concerns. He has had balance issues for 3 years. This has gotten severe enough that he quit playing tennis. When he attempts to hike in the mountains in [place] he's states the path seems to be unstable.He feels uncomfortable walking in crowds fearing he will fall into someone.  He also has developed fungal changes of the right great toenail. He went to an urgent care was placed on generic Lamisil. He was given a three-month supply.  Significantly he is on Crestor once a week. He had liver functions performed 07/30/15 which were normal. His lipids were at goal at that time. Trimming of the nail by a professional seems to have helped symptoms.  He also has discomfort in the thighs which he describes as soreness. He stopped Crestor 3-4 months with improvement in the symptoms. There was no improvement in his balance. He has minor discomfort in the calves as well.  He denies any benign positional vertigo or postural hypotension symptoms. He has no other neuromuscular symptoms associated with the balance dysfunction.  He is on generic Plavix because of prior stenting for CAD.  Review of Systems Fever, chills, sweats, or unexplained weight loss not present. No significant headaches. Mental status change or memory loss denied. Blurred vision , diplopia or vision loss absent.  No loss of control of bladder or bowels. Radicular type pain absent. No seizure stigmata. He describes intermittent intention tremor. Denied were any change in heart rhythm or rate associated with the balance issues. There was no associated chest pain or shortness of breath . Also specifically denied prior to the episodes were headache, limb weakness, tingling, or numbness.     Objective:   Physical Exam Pertinent or positive findings include: Pattern alopecia is present. He has bilateral  ptosis. Tympanic membranes are dull. Tuning fork lateralizes to the left. Air conduction is greater than bone conduction bilaterally. He has slight decreased hearing bilaterally, possibly slightly more on the left. There is subtle asymmetry of the smile; the right mouth droops slightly. He has DIP osteoarthritic changes.. Interosseous wasting is noted of the hands. When he walks the feet are splayed outward. When distracted he has a finger tapping type tremor of the right index finger. With Romberg and finger-nose testing there is slight swaying but no past-pointing or need to broaden his stance. He has fungal changes of the right great toenail. There is no definite paronychia present.  General appearance :adequately nourished; in no distress.Appears younger than stated age  Eyes: No conjunctival inflammation or scleral icterus is present.  Oral exam:  Lips and gums are healthy appearing.There is no oropharyngeal erythema or exudate noted. Dental hygiene is good.  Heart:  Normal rate and regular rhythm. S1 and S2 normal without gallop, murmur, click, rub or other extra sounds    Lungs:Chest clear to auscultation; no wheezes, rhonchi,rales ,or rubs present.No increased work of breathing.   Abdomen: bowel sounds normal, soft and non-tender without masses, organomegaly or hernias noted.  No guarding or rebound.   Vascular : all pulses equal ; no bruits present.  Skin:Warm & dry.  Intact without suspicious lesions or rashes ; no tenting or jaundice   Lymphatic: No lymphadenopathy is noted about the head, neck, axilla.   Neuro: Strength, tone & DTRs normal.     Assessment & Plan:  #1subjective imbalance present  at least 3 years but worse over the last year.  #2  tremor both rest and intention by history.  #3 onychomycosis; clinically low risk for systemic complication. High potential for drug induced hepatic issues with prolonged oral Lamisil  #4 vague thigh muscle discomfort.  Plan:  Neurology evaluation is indicated for his subjective imbalance and the tremor. Clinically there is no definite Parkinson's.  Labs will be collected to assess risk for the fungal nail changes and the thigh symptoms.

## 2015-09-14 NOTE — Progress Notes (Signed)
Pre visit review using our clinic review tool, if applicable. No additional management support is needed unless otherwise documented below in the visit note. 

## 2015-09-18 ENCOUNTER — Other Ambulatory Visit (INDEPENDENT_AMBULATORY_CARE_PROVIDER_SITE_OTHER): Payer: Medicare Other

## 2015-09-18 DIAGNOSIS — M609 Myositis, unspecified: Secondary | ICD-10-CM | POA: Diagnosis not present

## 2015-09-18 DIAGNOSIS — B351 Tinea unguium: Secondary | ICD-10-CM

## 2015-09-18 DIAGNOSIS — R251 Tremor, unspecified: Secondary | ICD-10-CM

## 2015-09-18 DIAGNOSIS — M791 Myalgia: Secondary | ICD-10-CM | POA: Diagnosis not present

## 2015-09-18 DIAGNOSIS — IMO0001 Reserved for inherently not codable concepts without codable children: Secondary | ICD-10-CM

## 2015-09-18 LAB — BASIC METABOLIC PANEL
BUN: 18 mg/dL (ref 6–23)
CO2: 28 meq/L (ref 19–32)
Calcium: 9.1 mg/dL (ref 8.4–10.5)
Chloride: 107 mEq/L (ref 96–112)
Creatinine, Ser: 1.01 mg/dL (ref 0.40–1.50)
GFR: 74.67 mL/min (ref >60.00)
GLUCOSE: 122 mg/dL — AB (ref 70–99)
POTASSIUM: 3.5 meq/L (ref 3.5–5.1)
SODIUM: 142 meq/L (ref 135–145)

## 2015-09-18 LAB — CK: CK TOTAL: 46 U/L (ref 7–232)

## 2015-09-18 LAB — TSH: TSH: 3.75 u[IU]/mL (ref 0.35–4.50)

## 2015-09-18 LAB — HEMOGLOBIN A1C: Hgb A1c MFr Bld: 4.7 % (ref 4.6–6.5)

## 2015-09-20 ENCOUNTER — Ambulatory Visit (INDEPENDENT_AMBULATORY_CARE_PROVIDER_SITE_OTHER): Payer: Medicare Other | Admitting: Neurology

## 2015-09-20 ENCOUNTER — Other Ambulatory Visit (INDEPENDENT_AMBULATORY_CARE_PROVIDER_SITE_OTHER): Payer: Medicare Other

## 2015-09-20 ENCOUNTER — Other Ambulatory Visit: Payer: Medicare Other

## 2015-09-20 ENCOUNTER — Other Ambulatory Visit: Payer: Self-pay | Admitting: Internal Medicine

## 2015-09-20 ENCOUNTER — Encounter: Payer: Self-pay | Admitting: Neurology

## 2015-09-20 ENCOUNTER — Telehealth: Payer: Self-pay | Admitting: Neurology

## 2015-09-20 VITALS — BP 138/88 | HR 72 | Ht 66.0 in | Wt 149.0 lb

## 2015-09-20 DIAGNOSIS — I2510 Atherosclerotic heart disease of native coronary artery without angina pectoris: Secondary | ICD-10-CM

## 2015-09-20 DIAGNOSIS — R251 Tremor, unspecified: Secondary | ICD-10-CM

## 2015-09-20 DIAGNOSIS — G609 Hereditary and idiopathic neuropathy, unspecified: Secondary | ICD-10-CM | POA: Diagnosis not present

## 2015-09-20 LAB — FOLATE: Folate: 8.1 ng/mL (ref >5.9)

## 2015-09-20 LAB — VITAMIN B12: Vitamin B-12: 259 pg/mL (ref 211–911)

## 2015-09-20 NOTE — Telephone Encounter (Signed)
Left message on machine for patient to call back.

## 2015-09-20 NOTE — Telephone Encounter (Signed)
-----   Message from [place], DO sent at 09/20/2015  2:21 PM EDT ----- Let pt know that B12 a little low and take B12 1046mcg daily

## 2015-09-20 NOTE — Progress Notes (Signed)
[name] [name] was seen today in the movement disorders clinic for neurologic consultation at the request of Unice Cobble, MD.  The consultation is for the evaluation of gait instability for approximately 2-3 years and tremor in the right hand.  Pt states that he didn't even notice the tremor but Dr. [name] pointed it out to him.  States that balance is off especially when on uneven ground.  He feels that he is "wobbling."  He has not had falls but has had near falls.  Some paresthesias in the feet at night when wakes up but otherwise no.  No DM.  Did stop his crestor because thought that it was causing myalgia.  They just restarted but he is only taking 2 tablets a week.  He had increased LFT's on zocor The records that were made available to me were reviewed.  Specific Symptoms:  Tremor: Yes.     Affected by caffeine:  Unknown, does not notice it  Affected by alcohol:  Unknown, does not notice it  Affected by stress:  Unknown, does not notice it  Affected by fatigue:    Unknown, does not notice it  Spills soup if on spoon:   Unknown, does not notice it  Spills glass of liquid if full:  Unknown, does not notice it  Affects ADL's (tying shoes, brushing teeth, etc):   Unknown, does not notice it  Any other sx's: Voice: yes Sleep: trouble getting and staying asleep for years (takes melatonin, 2 mg, and that does help get to sleep)  Vivid Dreams:  Yes.   - "I had a violent one last night"  Acting out dreams:  Yes.   Wet Pillows: No. Postural symptoms:  Yes.    Falls?  No. Bradykinesia symptoms: slow movements and "less agile" Loss of smell:  yes Loss of taste:  No. Urinary Incontinence:  No. Difficulty Swallowing:  Yes.  , rarely when has sinus drainage Handwriting, micrographia: Yes.   (right hand dominant) Trouble with ADL's:  No.  Trouble buttoning clothing: No. Depression:  No. Memory changes:  Yes.  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  visual distortions: No. N/V:  No. Lightheaded:  Yes.   but very rarely and associates with lack of sleep  Syncope: No. Diplopia:  No. Dyskinesia:  No.  Neuroimaging has not previously been performed.    PREVIOUS MEDICATIONS: none to date  ALLERGIES:   Allergies  Allergen Reactions  . Hydromorphone Hcl      Dilaudid Rxed for renal calculi caused angioedema   . Phenytoin Swelling  . Zocor [Simvastatin - High Dose]     Elevated hepatic enzymes  . Crestor [Rosuvastatin Calcium]   . Hydromorphone Hcl      Dilaudid Rxed for renal calculi caused angioedema  . Statins     CURRENT MEDICATIONS:  Outpatient Encounter Prescriptions as of 09/20/2015  Medication Sig  . ADVAIR DISKUS 100-50 MCG/DOSE AEPB TAKE 1 PUFF BY MOUTH TWICE DAILY AS NEEDED  . amLODipine (NORVASC) 5 MG tablet TAKE 1 TABLET BY MOUTH EVERY DAY  . aspirin 81 MG tablet Take 81 mg by mouth daily.    . clopidogrel (PLAVIX) 75 MG tablet TAKE 1 TABLET BY MOUTH EVERY DAY  . fexofenadine (ALLEGRA) 180 MG tablet Take 180 mg by mouth  as needed for allergies or rhinitis.   . fluticasone (FLONASE) 50 MCG/ACT nasal spray PLACE TWO SPRAYS INTO THE NOSE AT BEDTIME  . MYRBETRIQ 50 MG TB24 tablet Take 50 mg by mouth daily.   . nitroGLYCERIN (NITROSTAT) 0.4 MG SL tablet Place 1 tablet (0.4 mg total) under the tongue every 5 (five) minutes as needed.  . ranitidine (ZANTAC) 150 MG tablet Take 1 tablet (150 mg total) by mouth 2 (two) times daily.  . rosuvastatin (CRESTOR) 5 MG tablet Take 1 tablet by mouth daily on Monday, Wednesday and Friday  . sucralfate (CARAFATE) 1 G tablet TAKE 1 TABLET BY MOUTH THREE TIMES DAILY WITH MEALS  . terbinafine (LAMISIL) 250 MG tablet TK 1 T PO ONCE D  . VIAGRA 100 MG tablet TAKE ONE-HALF TO 1 TABLET BY MOUTH ONCE DAILY AS NEEDED   No facility-administered encounter medications on  file as of 09/20/2015.    PAST MEDICAL HISTORY:   Past Medical History  Diagnosis Date  . CAD (coronary artery disease)     a. s/p Cypher DES x 2 to prox LAD and Cypher DES x 1 to distal LAD 08/2009;  b. cath with residual D2 50% 10/05 and normal EF;  c. Myoview 6/11: EF 60%, mild fixed INF, inferoseptal, inferoapical thinning, no ischemia, low risk, EF 60%  . HLD (hyperlipidemia)     h/o elevated LFTs on Zocor  . GERD (gastroesophageal reflux disease)   . Asthma     post infectious  . Skin cancer, basal cell     Dr Sherrye Payor  . Nephrolithiasis   . Colon polyps   . Bladder cancer [facility])     Dr Estill Dooms, [facility]  . Other abnormal glucose 2012    FBS 104    PAST SURGICAL HISTORY:   Past Surgical History  Procedure Laterality Date  . Coronary artery stenting  2005    Dr. Olevia Perches, 3 vessel  . Lithotripsy  2002  . Colonoscopy with polypectomy  2005    Dr. Verl Blalock  . Upper gastrointestinal endoscopy  2005    Esophageal reflux, hiatal hernia  . Bladder tumor excision  2011, 2012, 2013    Dr Estill Dooms, [facility]  . Cataract extraction  2003    Bilateral, [facility]    SOCIAL HISTORY:   Social History   Social History  . Marital Status: Married    Spouse Name: N/A  . Number of Children: N/A  . Years of Education: N/A   Occupational History  . Not on file.   Social History Main Topics  . Smoking status: Former Smoker    Types: Cigarettes    Quit date: 11/17/1960  . Smokeless tobacco: Not on file     Comment: smoked 1948-1962, up to 1/2 ppd  . Alcohol Use: 6.0 oz/week    10 Glasses of wine per week     Comment: wine @ dinner  . Drug Use: No  . Sexual Activity: Not on file   Other Topics Concern  . Not on file   Social History Narrative    FAMILY HISTORY:   Family Status  Relation Status Death Age  . Mother Deceased     stroke  . Father Deceased     heart disease  . Paternal Grandmother Deceased   . Brother Deceased     ETOH, depression  . Brother  Alive     healthy  . Brother Alive     healthy  . Sister Alive  healthy    ROS:  Denies neck or back pain.  No lateralizing weakness or paresthesias.  A complete 10 system review of systems was obtained and was unremarkable apart from what is mentioned above.  PHYSICAL EXAMINATION:    VITALS:   Filed Vitals:   09/20/15 0818  BP: 138/88  Pulse: 72  Height: _0  (1.676 m)  Weight: 149 lb (67.586 kg)    GEN:  The patient appears stated age and is in NAD. HEENT:  Normocephalic, atraumatic.  The mucous membranes are moist. The superficial temporal arteries are without ropiness or tenderness. CV:  RRR Lungs:  CTAB Neck/HEME:  There are no carotid bruits bilaterally.  Neurological examination:  Orientation: The patient is alert and oriented x3. Fund of knowledge is appropriate.  Recent and remote memory are intact.  Attention and concentration are normal.    Able to name objects and repeat phrases. Cranial nerves: There is good facial symmetry. Pupils are equal round and reactive to light bilaterally. Fundoscopic exam reveals clear margins bilaterally. Extraocular muscles are intact. The visual fields are full to confrontational testing. The speech is fluent and clear. Soft palate rises symmetrically and there is no tongue deviation. Hearing is intact to conversational tone. Sensation: Sensation is intact to light and pinprick throughout (facial, trunk, extremities). Pinprick was decreased in a stocking distribution.  Vibration is decreased at the bilateral big toe. There is no extinction with double simultaneous stimulation. There is no sensory dermatomal level identified. Motor: Strength is 5/5 in the bilateral upper and lower extremities.   Shoulder shrug is equal and symmetric.  There is no pronator drift. Deep tendon reflexes: Deep tendon reflexes are 3/4 at the bilateral biceps, triceps, brachioradialis, patella and 2/4 at the bilateral achilles. Plantar responses are downgoing  bilaterally.  Movement examination: Tone: There is mild rigidity in the RUE.  Tone elsewhere was normal.   Abnormal movements: There is an irregular resting tremor in the RUE; there is some tremor re-emergence with ambulation Coordination:  There is no decremation with RAM's, with any form of RAMS, including alternating supination and pronation of the forearm, hand opening and closing, finger taps, heel taps and toe taps. Gait and Station: The patient has no difficulty arising out of a deep-seated chair without the use of the hands. The patient's stride length is mildly unsteady with re-emergent tremor on the right.  Minimal difficulty with tandem gait.  Able to heel toe walk without issue.  Able to stand in the romberg position.    No results found for: VITAMINB12  Lab Results  Component Value Date   TSH 3.75 09/18/2015   Lab Results  Component Value Date   HGBA1C 4.7 09/18/2015     Chemistry      Component Value Date/Time   NA 142 09/18/2015 1556   K 3.5 09/18/2015 1556   CL 107 09/18/2015 1556   CO2 28 09/18/2015 1556   BUN 18 09/18/2015 1556   CREATININE 1.01 09/18/2015 1556      Component Value Date/Time   CALCIUM 9.1 09/18/2015 1556   ALKPHOS 116 07/30/2015 0737   AST 20 07/30/2015 0737   ALT 20 07/30/2015 0737   BILITOT 0.8 07/30/2015 0737       ASSESSMENT/PLAN:  1.  Gait instability  -I do think that this is potentially multifactorial.  He has definite evidence of a diffuse peripheral neuropathy, which is likely idiopathic.  We'll do a B12, folate, RPR, SPEP/UPEP with immunofixation.  His hemoglobin A1c was  just done and was only 4.7.  We talked about safety.  He stated that he thinks he did better when he was enrolled in tai chi and wants to get back involved and I encouraged him to do so.  Will also send him for balance therapy at the neuro-rehabilitation center. 2.  Parkinsonian tremor  -While the patient does have a parkinsonian tremor on the right, he does not  meet formal criteria for the diagnosis of idiopathic Parkinson's disease.  This certainly does not mean this won't develop in the future and I would like to see him every 6 months just to make sure. 3.  We will call him with the results of his lab work and otherwise plan on seeing him back in 6 months, unless new neurologic issues arise.  Much greater than 50% of this visit was spent in counseling with the patient and the family.  Total face to face time:  45 min

## 2015-09-20 NOTE — Patient Instructions (Signed)
1. You have been referred to Neuro Rehab. They will call you directly to schedule an appointment.  Please call [phone] if you do not hear from them.  2. Your provider has requested that you have labwork completed today. Please go to [facility] on the second floor of this building before leaving the office today. You do not need to check in. If you are not called within 15 minutes please check with the front desk.

## 2015-09-21 ENCOUNTER — Telehealth: Payer: Self-pay | Admitting: Neurology

## 2015-09-21 LAB — RPR

## 2015-09-21 NOTE — Telephone Encounter (Signed)
LMOM making patient aware to start B12 supplements. Okay to leave detailed message on machine per patient's DPR. Patient to call with any questions.

## 2015-09-21 NOTE — Telephone Encounter (Signed)
Pt returned call /call back @ [phone]

## 2015-09-21 NOTE — Telephone Encounter (Signed)
Spoke with patient and made him aware to start b12 supplements.

## 2015-09-24 LAB — SPEP & IFE WITH QIG
ALPHA-1-GLOBULIN: 0.3 g/dL (ref 0.2–0.3)
ALPHA-2-GLOBULIN: 0.6 g/dL (ref 0.5–0.9)
Albumin ELP: 4.2 g/dL (ref 3.8–4.8)
BETA 2: 0.3 g/dL (ref 0.2–0.5)
Beta Globulin: 0.4 g/dL (ref 0.4–0.6)
GAMMA GLOBULIN: 0.7 g/dL — AB (ref 0.8–1.7)
IGM, SERUM: 32 mg/dL — AB (ref 41–251)
IgA: 154 mg/dL (ref 68–379)
IgG (Immunoglobin G), Serum: 717 mg/dL (ref 650–1600)
TOTAL PROTEIN, SERUM ELECTROPHOR: 6.5 g/dL (ref 6.1–8.1)

## 2015-09-24 LAB — UIFE/LIGHT CHAINS/TP QN, 24-HR UR
Albumin, U: DETECTED
TOTAL PROTEIN, URINE-UPE24: 8 mg/dL (ref 5–25)

## 2015-10-22 ENCOUNTER — Encounter: Payer: Self-pay | Admitting: Physical Therapy

## 2015-10-22 ENCOUNTER — Ambulatory Visit: Payer: Medicare Other | Attending: Neurology | Admitting: Physical Therapy

## 2015-10-22 DIAGNOSIS — R29818 Other symptoms and signs involving the nervous system: Secondary | ICD-10-CM | POA: Diagnosis not present

## 2015-10-22 DIAGNOSIS — R2689 Other abnormalities of gait and mobility: Secondary | ICD-10-CM

## 2015-10-22 DIAGNOSIS — R269 Unspecified abnormalities of gait and mobility: Secondary | ICD-10-CM | POA: Diagnosis not present

## 2015-10-22 DIAGNOSIS — R2681 Unsteadiness on feet: Secondary | ICD-10-CM | POA: Diagnosis not present

## 2015-10-22 DIAGNOSIS — R531 Weakness: Secondary | ICD-10-CM | POA: Insufficient documentation

## 2015-10-23 NOTE — Therapy (Signed)
[place] [address] [place], [place], [address] Phone: [phone]   Fax:  [phone]  Physical Therapy Evaluation  Patient Details  Name: [name] [name] MRN: [id number] Date of Birth: 12-07-1928 Referring Provider: Dr. Wells Guiles Tat  Encounter Date: 10/22/2015      PT End of Session - 10/22/15 1315    Visit Number 1   Number of Visits 10   Date for PT Re-Evaluation 11/21/15   Authorization Type Medicare G-code every 10th visit    PT Start Time 1233   PT Stop Time 1316   PT Time Calculation (min) 43 min   Equipment Utilized During Treatment Gait belt   Activity Tolerance Patient tolerated treatment well   Behavior During Therapy [facility] for tasks assessed/performed      Past Medical History  Diagnosis Date  . CAD (coronary artery disease)     a. s/p Cypher DES x 2 to prox LAD and Cypher DES x 1 to distal LAD 08/2009;  b. cath with residual D2 50% 10/05 and normal EF;  c. Myoview 6/11: EF 60%, mild fixed INF, inferoseptal, inferoapical thinning, no ischemia, low risk, EF 60%  . HLD (hyperlipidemia)     h/o elevated LFTs on Zocor  . GERD (gastroesophageal reflux disease)   . Asthma     post infectious  . Skin cancer, basal cell     Dr Sherrye Payor  . Nephrolithiasis   . Colon polyps   . Bladder cancer [facility])     Dr Estill Dooms, [facility]  . Other abnormal glucose 2012    FBS 104    Past Surgical History  Procedure Laterality Date  . Coronary artery stenting  2005    Dr. Olevia Perches, 3 vessel  . Lithotripsy  2002  . Colonoscopy with polypectomy  2005    Dr. Verl Blalock  . Upper gastrointestinal endoscopy  2005    Esophageal reflux, hiatal hernia  . Bladder tumor excision  2011, 2012, 2013    Dr Estill Dooms, [facility]  . Cataract extraction  2003    Bilateral, [facility]    There were no vitals filed for this visit.  Visit Diagnosis:  Abnormality of gait  Unsteadiness  Balance problems  Weakness  generalized      Subjective Assessment - 10/22/15 1237    Subjective This 81yo male was diagnosed with Hereditary & Idiopathy Peripheral Neuropathy with gait & balance issues. He saw Dr. Carles Collet on 09/20/2015. She referred to PT for evaluation.   Patient Stated Goals to get balance and strength back.    Currently in Pain? No/denies            [facility] PT Assessment - 10/22/15 1230    Assessment   Medical Diagnosis Gait Abnormality Peripheral Neuropathy   Referring Provider Dr. Wells Guiles Tat   Onset Date/Surgical Date 09/20/15  MD appt   Precautions   Precautions Fall   Restrictions   Weight Bearing Restrictions No   Balance Screen   Has the patient fallen in the past 6 months No   Has the patient had a decrease in activity level because of a fear of falling?  No   Home Environment   Living Environment Private residence   Living Arrangements Spouse/significant other   Type of [place] to enter   Entrance Stairs-Number of Steps 2   Entrance Stairs-Rails None  having [place] Two level;1/2 bath on main level;Bed/bath upstairs  all bedrooms upstairs   Alternate Level Stairs-Number of Steps 14   Alternate Level Stairs-Rails Left   Prior Function   Level of Independence Independent;Independent with gait   Posture/Postural Control   Posture/Postural Control No significant limitations   ROM / Strength   AROM / PROM / Strength AROM;Strength   AROM   Overall AROM  Within functional limits for tasks performed   Strength   Overall Strength Deficits   Strength Assessment Site Hip;Knee;Ankle   Right/Left Hip Right;Left   Right Hip Flexion 4+/5   Right Hip Extension 4-/5   Right Hip ABduction 4/5   Left Hip Flexion 4+/5   Left Hip Extension 4-/5   Left Hip ABduction 4/5   Right/Left Knee Right;Left   Right Knee Flexion 4/5   Right Knee Extension 4+/5   Left Knee Flexion 4/5   Left Knee Extension 4+/5   Right/Left Ankle Right;Left    Right Ankle Dorsiflexion 4/5   Left Ankle Dorsiflexion 4/5   Ambulation/Gait   Ambulation/Gait Yes   Ambulation/Gait Assistance 5: Supervision   Ambulation Distance (Feet) 300 Feet   Assistive device None   Gait Pattern Decreased stride length;Right flexed knee in stance;Left flexed knee in stance;Trunk flexed   Ambulation Surface Indoor;Level   Gait velocity 3.74 ft/sec   Stairs Yes   Stairs Assistance 5: Supervision   Stair Management Technique One rail Left;Alternating pattern;Forwards   Number of Stairs 4   Standardized Balance Assessment   Standardized Balance Assessment Berg Balance Test;Timed Up and Go Test;Four Square Step Test   Berg Balance Test   Sit to Stand Able to stand without using hands and stabilize independently   Standing Unsupported Able to stand safely 2 minutes   Sitting with Back Unsupported but Feet Supported on Floor or Stool Able to sit safely and securely 2 minutes   Stand to Sit Sits safely with minimal use of hands   Transfers Able to transfer safely, minor use of hands   Standing Unsupported with Eyes Closed Able to stand 10 seconds safely   Standing Ubsupported with Feet Together Able to place feet together independently and stand 1 minute safely   From Standing, Reach Forward with Outstretched Arm Can reach confidently >25 cm (10")   From Standing Position, Pick up Object from Floor Able to pick up shoe safely and easily   From Standing Position, Turn to Look Behind Over each Shoulder Looks behind from both sides and weight shifts well   Turn 360 Degrees Able to turn 360 degrees safely but slowly   Standing Unsupported, Alternately Place Feet on Step/Stool Able to complete 4 steps without aid or supervision   Standing Unsupported, One Foot in Front Able to take small step independently and hold 30 seconds   Standing on One Leg Tries to lift leg/unable to hold 3 seconds but remains standing independently   Total Score 47   Timed Up and Go Test   Normal  TUG (seconds) 8.49   Four Square Step Test    Trial One  13.27   Functional Gait  Assessment   Gait assessed  Yes   Gait Level Surface Walks 20 ft in less than 5.5 sec, no assistive devices, good speed, no evidence for imbalance, normal gait pattern, deviates no more than 6 in outside of the 12 in walkway width.   Change in Gait Speed Able to smoothly change walking speed without loss of balance or gait deviation. Deviate no more than 6 in outside of  the 12 in walkway width.   Gait with Horizontal Head Turns Performs head turns smoothly with slight change in gait velocity (eg, minor disruption to smooth gait path), deviates 6-10 in outside 12 in walkway width, or uses an assistive device.   Gait with Vertical Head Turns Performs task with slight change in gait velocity (eg, minor disruption to smooth gait path), deviates 6 - 10 in outside 12 in walkway width or uses assistive device   Gait and Pivot Turn Pivot turns safely within 3 sec and stops quickly with no loss of balance.   Step Over Obstacle Is able to step over one shoe box (4.5 in total height) without changing gait speed. No evidence of imbalance.   Gait with Narrow Base of Support Ambulates less than 4 steps heel to toe or cannot perform without assistance.   Gait with Eyes Closed Walks 20 ft, uses assistive device, slower speed, mild gait deviations, deviates 6-10 in outside 12 in walkway width. Ambulates 20 ft in less than 9 sec but greater than 7 sec.   Ambulating Backwards Walks 20 ft, uses assistive device, slower speed, mild gait deviations, deviates 6-10 in outside 12 in walkway width.   Steps Alternating feet, must use rail.   Total Score 21                                PT Long Term Goals - 10/28/15 1315    PT LONG TERM GOAL #1   Title Patient demonstrates understanding of ongoing fitness plan / HEP. (Target Date: 11/21/2015)   Time 1   Period Months   Status New   PT LONG TERM GOAL #2   Title Berg  Balance >/= 52/56  (Target Date: 11/21/2015)   Time 1   Period Months   Status New   PT LONG TERM GOAL #3   Title Functional Gait Assessment >/= 25/30  (Target Date: 11/21/2015)   Time 1   Period Months   Status New               Plan - 10/28/2015 1315    Clinical Impression Statement This 81yo male has been very active his whole life until ~ 1-2 years ago. He has developed weakness with deconditioning. Dr. Carles Collet noticed a tremor in right hand that will be monitored. PT testing of Berg Balance 47/56 (moderate fall risk >50%), Functional Gait Assessment  21/30 (indicates fall risk)) and gait abnormalities when negotiating barriers, scanning & any distraction. Patient would benefit from instruction & balance activities along with guidance in activity level.    Pt will benefit from skilled therapeutic intervention in order to improve on the following deficits Abnormal gait;Decreased activity tolerance;Decreased balance;Decreased endurance;Decreased mobility;Decreased strength   Rehab Potential Good   PT Frequency 2x / week   PT Duration Other (comment)  5 weeks (1 month)   PT Treatment/Interventions ADLs/Self Care Home Management;Gait training;Stair training;Functional mobility training;Therapeutic activities;Therapeutic exercise;Balance training;Neuromuscular re-education;Patient/family education   PT Next Visit Plan HEP for balance / vestibular, gait with scanning   Consulted and Agree with Plan of Care Patient          G-Codes - 28-Oct-2015 1315    Functional Assessment Tool Used Berg Balance 47/56 and Functional Gait Assessment 21/30   Functional Limitation Mobility: Walking and moving around   Mobility: Walking and Moving Around Current Status [id number]) At least 40 percent but less than 60 percent impaired, limited  or restricted   Mobility: Walking and Moving Around Goal Status [phone]) At least 20 percent but less than 40 percent impaired, limited or restricted       Problem  List Patient Active Problem List   Diagnosis Date Noted  . Essential hypertension, benign 06/16/2012  . CAD (coronary artery disease) 05/27/2011  . Bladder cancer ([place]) 05/27/2011  . Alkaline phosphatase raised 05/27/2011  . Osteopenia 05/27/2011  . THYROID FUNCTION TEST, ABNORMAL 04/10/2009  . SKIN CANCER, HX OF 02/28/2009  . COLONIC POLYPS, HX OF 02/28/2009  . ELEVATED PROSTATE SPECIFIC ANTIGEN 03/28/2008  . HYPERLIPIDEMIA 01/31/2008  . HYPERPLASIA PROSTATE UNS W/O UR OBST & OTH LUTS 01/31/2008  . IMPOTENCE OF ORGANIC ORIGIN 01/31/2008  . ASTHMA 09/28/2007  . GERD 07/22/2007  . NEPHROLITHIASIS, HX OF 07/22/2007    Jamey Reas PT, DPT 10/23/2015, 8:39 AM  [facility] [address] [place], [place], [address] Phone: [phone]   Fax:  [phone]  Name: [name] [name] MRN: [id number] Date of Birth: 02/20/1929

## 2015-10-26 ENCOUNTER — Ambulatory Visit: Payer: Medicare Other | Admitting: Physical Therapy

## 2015-10-26 ENCOUNTER — Encounter: Payer: Self-pay | Admitting: Physical Therapy

## 2015-10-26 DIAGNOSIS — R269 Unspecified abnormalities of gait and mobility: Secondary | ICD-10-CM

## 2015-10-26 DIAGNOSIS — R2681 Unsteadiness on feet: Secondary | ICD-10-CM

## 2015-10-26 DIAGNOSIS — R2689 Other abnormalities of gait and mobility: Secondary | ICD-10-CM

## 2015-10-26 DIAGNOSIS — R531 Weakness: Secondary | ICD-10-CM

## 2015-10-26 DIAGNOSIS — R29818 Other symptoms and signs involving the nervous system: Secondary | ICD-10-CM | POA: Diagnosis not present

## 2015-10-26 NOTE — Patient Instructions (Signed)
Feet Together (Compliant Surface) Varied Arm Positions - Eyes Closed    In corner with chair in front of you for safety:Stand on compliant surface: pillow with feet together and arms at sides Close eyes and visualize upright position. Hold__20__ seconds. Repeat __3_ times per session. Do _1-2_ sessions per day.  Copyright  VHI. All rights reserved.  Feet Together (Compliant Surface) Head Motion - Eyes Open    In corner with chair in front for safety with eyes open, standing on compliant surface: pillow, feet together, move head: 1. Slowly up and down 2. Slowly left and right 3. Slowly diagonal pattern both ways Repeat __10__ times. Do __1-2__ sessions per day.  Copyright  VHI. All rights reserved.     "I love a Database administrator   At counter for balance as needed: high knee marching forward and then backward. 3 second pauses with each knee lift.  Repeat 3 laps each way. Do _1-2_ sessions per day. http://gt2.exer.us/345    Copyright  VHI. All rights reserved.  Walking on Heels   At counter: Walk on heels forward while continuing on a straight path, and then walk on heels backward to starting position. Repeat for 3 laps each way. Do _1-2__ sessions per day.  Copyright  VHI. All rights reserved.  Walking on Toes   At counter for balance as needed: Walk on toes forward while continuing on a straight path, and then backwards on toes to starting position. Repeat 3 laps each way. Do _1-2___ sessions per day.  Copyright  VHI. All rights reserved.  Feet Heel-Toe "Tandem"   At counter: Arms at sides, walk a straight line forward bringing one foot directly in front of the other, and then a straight line backwards bringing one foot directly behind the other one.  Repeat for _3 laps each way. Do _1-2_ sessions per day.  Copyright  VHI. All rights reserved.   Side to Side Head Motion    Perform without assistive device. Walking on solid surface, turn head and eyes to left for  3-4 steps.  Then, turn head and eyes to opposite side for 3-4 steps. Repeat for 4 laps in hallway. Do __1-2__ sessions per day.   Copyright  VHI. All rights reserved.  Up / Down Head Motion    Perform without assistive device. Walking on solid surface, move head and eyes toward ceiling for 3-4 steps. Then, move head and eyes toward floor for 3-4 steps. Repeat for 4 laps in hallway. Do _1-2___ sessions per day.  Copyright  VHI. All rights reserved.

## 2015-10-29 NOTE — Therapy (Signed)
[place] [facility] [address] [place], [place], [address] Phone: [phone]   Fax:  [phone]  Physical Therapy Treatment  Patient Details  Name: [name] [name] MRN: [id number] Date of Birth: 05/26/1929 Referring Provider: Dr. Wells Guiles Tat  Encounter Date: 10/26/2015   10/26/15 1452  PT Visits / Re-Eval  Visit Number 2  Number of Visits 10  Date for PT Re-Evaluation 11/21/15  Authorization  Authorization Type Medicare G-code every 10th visit   PT Time Calculation  PT Start Time 1448  PT Stop Time 1530  PT Time Calculation (min) 42 min  PT - End of Session  Equipment Utilized During Treatment Gait belt  Activity Tolerance Patient tolerated treatment well  Behavior During Therapy [facility] for tasks assessed/performed     Past Medical History  Diagnosis Date  . CAD (coronary artery disease)     a. s/p Cypher DES x 2 to prox LAD and Cypher DES x 1 to distal LAD 08/2009;  b. cath with residual D2 50% 10/05 and normal EF;  c. Myoview 6/11: EF 60%, mild fixed INF, inferoseptal, inferoapical thinning, no ischemia, low risk, EF 60%  . HLD (hyperlipidemia)     h/o elevated LFTs on Zocor  . GERD (gastroesophageal reflux disease)   . Asthma     post infectious  . Skin cancer, basal cell     Dr Sherrye Payor  . Nephrolithiasis   . Colon polyps   . Bladder cancer [facility])     Dr Estill Dooms, [facility]  . Other abnormal glucose 2012    FBS 104    Past Surgical History  Procedure Laterality Date  . Coronary artery stenting  2005    Dr. Olevia Perches, 3 vessel  . Lithotripsy  2002  . Colonoscopy with polypectomy  2005    Dr. Verl Blalock  . Upper gastrointestinal endoscopy  2005    Esophageal reflux, hiatal hernia  . Bladder tumor excision  2011, 2012, 2013    Dr Estill Dooms, [facility]  . Cataract extraction  2003    Bilateral, [facility]    There were no vitals filed for this visit.  Visit Diagnosis:  Abnormality of  gait  Unsteadiness  Balance problems  Weakness generalized      10/26/15 1451  Symptoms/Limitations  Subjective No new complaints. No falls or pain to report. Did wear his sneakers for session today.  Pain Assessment  Currently in Pain? No/denies   Treatment: Neuro Re-ed: educated pt on and issued HEP for balance at home. Refer to pt instructions for full details.         10/26/15 1526  PT Education  Education Details HEP: corner balance on pillow, countertop balance activities, gait with head turns/nods  Person(s) Educated Patient  Methods Explanation;Demonstration;Handout  Comprehension Verbalized understanding;Returned demonstration;Need further instruction;Verbal cues required          PT Long Term Goals - 10/22/15 1315    PT LONG TERM GOAL #1   Title Patient demonstrates understanding of ongoing fitness plan / HEP. (Target Date: 11/21/2015)   Time 1   Period Months   Status New   PT LONG TERM GOAL #2   Title Berg Balance >/= 52/56  (Target Date: 11/21/2015)   Time 1   Period Months   Status New   PT LONG TERM GOAL #3   Title Functional Gait Assessment >/= 25/30  (Target Date: 11/21/2015)   Time 1   Period Months   Status New  10/26/15 1452  Plan  Clinical Impression Statement Focused today's session on education pt on HEP for balance. Pt able to return demo of all balance activities without any issues. Pt making steady progress toward goals.   Pt will benefit from skilled therapeutic intervention in order to improve on the following deficits Abnormal gait;Decreased activity tolerance;Decreased balance;Decreased endurance;Decreased mobility;Decreased strength  Rehab Potential Good  PT Frequency 2x / week  PT Duration Other (comment) (5 weeks (1 month))  PT Treatment/Interventions ADLs/Self Care Home Management;Gait training;Stair training;Functional mobility training;Therapeutic activities;Therapeutic exercise;Balance training;Neuromuscular  re-education;Patient/family education  PT Next Visit Plan Continue toward goals with emphasis on gait and balance  Consulted and Agree with Plan of Care Patient        Problem List Patient Active Problem List   Diagnosis Date Noted  . Essential hypertension, benign 06/16/2012  . CAD (coronary artery disease) 05/27/2011  . Bladder cancer ([place]) 05/27/2011  . Alkaline phosphatase raised 05/27/2011  . Osteopenia 05/27/2011  . THYROID FUNCTION TEST, ABNORMAL 04/10/2009  . SKIN CANCER, HX OF 02/28/2009  . COLONIC POLYPS, HX OF 02/28/2009  . ELEVATED PROSTATE SPECIFIC ANTIGEN 03/28/2008  . HYPERLIPIDEMIA 01/31/2008  . HYPERPLASIA PROSTATE UNS W/O UR OBST & OTH LUTS 01/31/2008  . IMPOTENCE OF ORGANIC ORIGIN 01/31/2008  . ASTHMA 09/28/2007  . GERD 07/22/2007  . NEPHROLITHIASIS, HX OF 07/22/2007    Willow Ora 10/29/2015, 12:14 PM  Willow Ora, PTA, [place] [address] [place] [place], [place] [address] [phone] 10/29/2015, 12:14 PM   Name: [name] [name] MRN: [id number] Date of Birth: [birth year]

## 2015-10-31 ENCOUNTER — Ambulatory Visit: Payer: Medicare Other

## 2015-10-31 DIAGNOSIS — H6123 Impacted cerumen, bilateral: Secondary | ICD-10-CM | POA: Diagnosis not present

## 2015-10-31 DIAGNOSIS — R2681 Unsteadiness on feet: Secondary | ICD-10-CM | POA: Diagnosis not present

## 2015-10-31 DIAGNOSIS — R269 Unspecified abnormalities of gait and mobility: Secondary | ICD-10-CM | POA: Diagnosis not present

## 2015-10-31 DIAGNOSIS — R531 Weakness: Secondary | ICD-10-CM

## 2015-10-31 DIAGNOSIS — R29818 Other symptoms and signs involving the nervous system: Secondary | ICD-10-CM | POA: Diagnosis not present

## 2015-10-31 DIAGNOSIS — R2689 Other abnormalities of gait and mobility: Secondary | ICD-10-CM

## 2015-10-31 NOTE — Therapy (Signed)
[place] [address] [place], [place], [address] Phone: [phone]   Fax:  [phone]  Physical Therapy Treatment  Patient Details  Name: [name] [name] MRN: [id number] Date of Birth: 04/04/1929 Referring Provider: Dr. Wells Guiles Tat  Encounter Date: 10/31/2015      PT End of Session - 10/31/15 1607    Visit Number 3   Number of Visits 10   Date for PT Re-Evaluation 11/21/15   Authorization Type Medicare G-code every 10th visit    PT Start Time 1400   PT Stop Time 1447   PT Time Calculation (min) 47 min   Activity Tolerance Patient tolerated treatment well   Behavior During Therapy [facility] for tasks assessed/performed      Past Medical History  Diagnosis Date  . CAD (coronary artery disease)     a. s/p Cypher DES x 2 to prox LAD and Cypher DES x 1 to distal LAD 08/2009;  b. cath with residual D2 50% 10/05 and normal EF;  c. Myoview 6/11: EF 60%, mild fixed INF, inferoseptal, inferoapical thinning, no ischemia, low risk, EF 60%  . HLD (hyperlipidemia)     h/o elevated LFTs on Zocor  . GERD (gastroesophageal reflux disease)   . Asthma     post infectious  . Skin cancer, basal cell     Dr Sherrye Payor  . Nephrolithiasis   . Colon polyps   . Bladder cancer [facility])     Dr Estill Dooms, [facility]  . Other abnormal glucose 2012    FBS 104    Past Surgical History  Procedure Laterality Date  . Coronary artery stenting  2005    Dr. Olevia Perches, 3 vessel  . Lithotripsy  2002  . Colonoscopy with polypectomy  2005    Dr. Verl Blalock  . Upper gastrointestinal endoscopy  2005    Esophageal reflux, hiatal hernia  . Bladder tumor excision  2011, 2012, 2013    Dr Estill Dooms, [facility]  . Cataract extraction  2003    Bilateral, [facility]    There were no vitals filed for this visit.  Visit Diagnosis:  Abnormality of gait  Weakness generalized  Balance problems      Subjective Assessment - 10/31/15 1405    Subjective  Reports soreness in ankles and knees from HEP.   Currently in Pain? Yes   Pain Score 5    Pain Location Knee   Pain Orientation Right;Left   Pain Descriptors / Indicators Aching   Pain Type Acute pain   Pain Onset Yesterday   Pain Frequency Intermittent   Aggravating Factors  going up and down steps   Pain Relieving Factors rest                         [facility] Adult PT Treatment/Exercise - 10/31/15 1442    High Level Balance   High Level Balance Activities Head turns;Tandem walking;Marching forwards;Marching backwards;Backward walking;Other (comment)  heel and toe walking   High Level Balance Comments also balance in corner on pillow, feet together, , eyes closed 20 sec, then eyes open head turns x 10 and nods x 10; then standing kicking over cones with one foot, then righting with other foot for SLS.   Neuro Re-ed    Neuro Re-ed Details  standing wall bumps x 10 with cues and demonstration    Exercises   Exercises Knee/Hip   Knee/Hip Exercises: Stretches   Active Hamstring Stretch 30 seconds;3  reps;Both   Knee/Hip Exercises: Standing   Hip Abduction Both;10 reps;Stengthening  with red t-band, standing at counter UE support                PT Education - 10/31/15 1607    Education provided Yes   Education Details educated to continue current HEP and progress via feet closer together and holding foot up longer on marches   Person(s) Educated Patient   Methods Explanation;Demonstration   Comprehension Need further instruction;Verbalized understanding             PT Long Term Goals - 10/22/15 1315    PT LONG TERM GOAL #1   Title Patient demonstrates understanding of ongoing fitness plan / HEP. (Target Date: 11/21/2015)   Time 1   Period Months   Status New   PT LONG TERM GOAL #2   Title Berg Balance >/= 52/56  (Target Date: 11/21/2015)   Time 1   Period Months   Status New   PT LONG TERM GOAL #3   Title Functional Gait Assessment >/= 25/30   (Target Date: 11/21/2015)   Time 1   Period Months   Status New               Plan - 10/31/15 1608    Clinical Impression Statement Patient able to review current HEP with min cues for performance.  Feel he performs them safely.  Demonstrates more difficulty with heel walking esp backwards with poor hip strategy; improved some with wall bumps this session.   PT Next Visit Plan continue balance and hip strategy/stepping strategy and ankle/hip strength         Problem List Patient Active Problem List   Diagnosis Date Noted  . Essential hypertension, benign 06/16/2012  . CAD (coronary artery disease) 05/27/2011  . Bladder cancer ([place]) 05/27/2011  . Alkaline phosphatase raised 05/27/2011  . Osteopenia 05/27/2011  . THYROID FUNCTION TEST, ABNORMAL 04/10/2009  . SKIN CANCER, HX OF 02/28/2009  . COLONIC POLYPS, HX OF 02/28/2009  . ELEVATED PROSTATE SPECIFIC ANTIGEN 03/28/2008  . HYPERLIPIDEMIA 01/31/2008  . HYPERPLASIA PROSTATE UNS W/O UR OBST & OTH LUTS 01/31/2008  . IMPOTENCE OF ORGANIC ORIGIN 01/31/2008  . ASTHMA 09/28/2007  . GERD 07/22/2007  . NEPHROLITHIASIS, HX OF 07/22/2007    [name] [name] 10/31/2015, 4:12 PM  Magda Kiel, PT 10/31/2015   [place] [address] [place], [place], [address] Phone: [phone]   Fax:  [phone]  Name: [name] [name] MRN: [id number] Date of Birth: Apr 10, 1929

## 2015-11-02 ENCOUNTER — Encounter: Payer: Self-pay | Admitting: Physical Therapy

## 2015-11-02 ENCOUNTER — Other Ambulatory Visit: Payer: Self-pay | Admitting: Cardiovascular Disease

## 2015-11-02 ENCOUNTER — Ambulatory Visit: Payer: Medicare Other | Admitting: Physical Therapy

## 2015-11-02 DIAGNOSIS — R531 Weakness: Secondary | ICD-10-CM

## 2015-11-02 DIAGNOSIS — R29818 Other symptoms and signs involving the nervous system: Secondary | ICD-10-CM | POA: Diagnosis not present

## 2015-11-02 DIAGNOSIS — R2681 Unsteadiness on feet: Secondary | ICD-10-CM | POA: Diagnosis not present

## 2015-11-02 DIAGNOSIS — R2689 Other abnormalities of gait and mobility: Secondary | ICD-10-CM

## 2015-11-02 DIAGNOSIS — R269 Unspecified abnormalities of gait and mobility: Secondary | ICD-10-CM

## 2015-11-02 NOTE — Therapy (Signed)
[place] [address] [place], [place], [address] Phone: [phone]   Fax:  [phone]  Physical Therapy Treatment  Patient Details  Name: [name] [name] MRN: [id number] Date of Birth: 17-Nov-1929 Referring Provider: Dr. Wells Guiles Tat  Encounter Date: 11/02/2015      PT End of Session - 11/02/15 1535    Visit Number 4   Number of Visits 10   Date for PT Re-Evaluation 11/21/15   Authorization Type Medicare G-code every 10th visit    PT Start Time 1531   PT Stop Time 1612   PT Time Calculation (min) 41 min   Activity Tolerance Patient tolerated treatment well   Behavior During Therapy [facility] for tasks assessed/performed      Past Medical History  Diagnosis Date  . CAD (coronary artery disease)     a. s/p Cypher DES x 2 to prox LAD and Cypher DES x 1 to distal LAD 08/2009;  b. cath with residual D2 50% 10/05 and normal EF;  c. Myoview 6/11: EF 60%, mild fixed INF, inferoseptal, inferoapical thinning, no ischemia, low risk, EF 60%  . HLD (hyperlipidemia)     h/o elevated LFTs on Zocor  . GERD (gastroesophageal reflux disease)   . Asthma     post infectious  . Skin cancer, basal cell     Dr Sherrye Payor  . Nephrolithiasis   . Colon polyps   . Bladder cancer [facility])     Dr Estill Dooms, [facility]  . Other abnormal glucose 2012    FBS 104    Past Surgical History  Procedure Laterality Date  . Coronary artery stenting  2005    Dr. Olevia Perches, 3 vessel  . Lithotripsy  2002  . Colonoscopy with polypectomy  2005    Dr. Verl Blalock  . Upper gastrointestinal endoscopy  2005    Esophageal reflux, hiatal hernia  . Bladder tumor excision  2011, 2012, 2013    Dr Estill Dooms, [facility]  . Cataract extraction  2003    Bilateral, [facility]    There were no vitals filed for this visit.  Visit Diagnosis:  Abnormality of gait  Weakness generalized  Balance problems  Unsteadiness      Subjective Assessment - 11/02/15 1534     Subjective No new complaints. No pain or falls to report. Reports he is doing his HEP at home now without issues.    Currently in Pain? No/denies   Pain Score 0-No pain     Treatment: Neuro Re-ed: Red mats without UE support: min guard assist to min assist. Performed each 3 laps each way. Cues on posture and ex form/technique. High knee marching fwd/bwd Tandem gait fwd/bwd Toe walk fwd/bwd Heel walk fwd/bwd  Air ex in corner with chair in front for balance as needed: min guard to min assist for balance. Narrow base of support Eyes closed no head movements, 15 sec's x 3 reps Eyes open: head nods, shakes and diagonals both ways x 10 each  Eyes closed: head nods, shakes, and diagonals both ways x 10 each   In parallel bars balance board: performed each both ways on board with intermittent need of UE support and min assist - alternating UE raises - bil UE raises together - eyes closed, 15 sec's x 3 reps - rocking with emphasis on upright posture/positioning         PT Long Term Goals - 10/22/15 1315    PT LONG TERM GOAL #1   Title  Patient demonstrates understanding of ongoing fitness plan / HEP. (Target Date: 11/21/2015)   Time 1   Period Months   Status New   PT LONG TERM GOAL #2   Title Berg Balance >/= 52/56  (Target Date: 11/21/2015)   Time 1   Period Months   Status New   PT LONG TERM GOAL #3   Title Functional Gait Assessment >/= 25/30  (Target Date: 11/21/2015)   Time 1   Period Months   Status New        11/02/15 1535  Plan  Clinical Impression Statement Pt making steady progress toward goals. Continues to demo poor hip strategies with balance activities.  Pt will benefit from skilled therapeutic intervention in order to improve on the following deficits Abnormal gait;Decreased activity tolerance;Decreased balance;Decreased endurance;Decreased mobility;Decreased strength  Rehab Potential Good  PT Frequency 2x / week  PT Duration Other (comment) (5 weeks (1  month))  PT Treatment/Interventions ADLs/Self Care Home Management;Gait training;Stair training;Functional mobility training;Therapeutic activities;Therapeutic exercise;Balance training;Neuromuscular re-education;Patient/family education  PT Next Visit Plan continue balance and hip strategy/stepping strategy and ankle/hip strength        Problem List Patient Active Problem List   Diagnosis Date Noted  . Essential hypertension, benign 06/16/2012  . CAD (coronary artery disease) 05/27/2011  . Bladder cancer ([place]) 05/27/2011  . Alkaline phosphatase raised 05/27/2011  . Osteopenia 05/27/2011  . THYROID FUNCTION TEST, ABNORMAL 04/10/2009  . SKIN CANCER, HX OF 02/28/2009  . COLONIC POLYPS, HX OF 02/28/2009  . ELEVATED PROSTATE SPECIFIC ANTIGEN 03/28/2008  . HYPERLIPIDEMIA 01/31/2008  . HYPERPLASIA PROSTATE UNS W/O UR OBST & OTH LUTS 01/31/2008  . IMPOTENCE OF ORGANIC ORIGIN 01/31/2008  . ASTHMA 09/28/2007  . GERD 07/22/2007  . NEPHROLITHIASIS, HX OF 07/22/2007    Willow Ora 11/02/2015, 4:16 PM  Willow Ora, PTA, [facility] [address], [place], [place] [address] [phone] 11/05/2015, 12:28 AM   Name: [name] [name] MRN: [id number] Date of Birth: Jun 27, 1929

## 2015-11-06 ENCOUNTER — Ambulatory Visit: Payer: Medicare Other | Admitting: Physical Therapy

## 2015-11-08 ENCOUNTER — Ambulatory Visit: Payer: Medicare Other | Admitting: Physical Therapy

## 2015-11-09 ENCOUNTER — Encounter: Payer: Self-pay | Admitting: Physical Therapy

## 2015-11-09 ENCOUNTER — Other Ambulatory Visit: Payer: Self-pay | Admitting: Internal Medicine

## 2015-11-09 ENCOUNTER — Ambulatory Visit: Payer: Medicare Other | Admitting: Physical Therapy

## 2015-11-09 DIAGNOSIS — R2681 Unsteadiness on feet: Secondary | ICD-10-CM

## 2015-11-09 DIAGNOSIS — R531 Weakness: Secondary | ICD-10-CM | POA: Diagnosis not present

## 2015-11-09 DIAGNOSIS — R2689 Other abnormalities of gait and mobility: Secondary | ICD-10-CM

## 2015-11-09 DIAGNOSIS — R269 Unspecified abnormalities of gait and mobility: Secondary | ICD-10-CM

## 2015-11-09 DIAGNOSIS — R29818 Other symptoms and signs involving the nervous system: Secondary | ICD-10-CM | POA: Diagnosis not present

## 2015-11-09 NOTE — Therapy (Signed)
[facility] [address] [place], [place], [address] Phone: [phone]   Fax:  [phone]  Physical Therapy Treatment  Patient Details  Name: [name] [name] MRN: [id number] Date of Birth: 21-Jul-1929 Referring Provider: Dr. Wells Guiles Tat  Encounter Date: 11/09/2015      PT End of Session - 11/09/15 1105    Visit Number 5   Number of Visits 10   Date for PT Re-Evaluation 11/21/15   Authorization Type Medicare G-code every 10th visit    PT Start Time 1103   PT Stop Time 1145   PT Time Calculation (min) 42 min   Activity Tolerance Patient tolerated treatment well   Behavior During Therapy [facility] for tasks assessed/performed      Past Medical History  Diagnosis Date  . CAD (coronary artery disease)     a. s/p Cypher DES x 2 to prox LAD and Cypher DES x 1 to distal LAD 08/2009;  b. cath with residual D2 50% 10/05 and normal EF;  c. Myoview 6/11: EF 60%, mild fixed INF, inferoseptal, inferoapical thinning, no ischemia, low risk, EF 60%  . HLD (hyperlipidemia)     h/o elevated LFTs on Zocor  . GERD (gastroesophageal reflux disease)   . Asthma     post infectious  . Skin cancer, basal cell     Dr Sherrye Payor  . Nephrolithiasis   . Colon polyps   . Bladder cancer [facility])     Dr Estill Dooms, [facility]  . Other abnormal glucose 2012    FBS 104    Past Surgical History  Procedure Laterality Date  . Coronary artery stenting  2005    Dr. Olevia Perches, 3 vessel  . Lithotripsy  2002  . Colonoscopy with polypectomy  2005    Dr. Verl Blalock  . Upper gastrointestinal endoscopy  2005    Esophageal reflux, hiatal hernia  . Bladder tumor excision  2011, 2012, 2013    Dr Estill Dooms, [facility]  . Cataract extraction  2003    Bilateral, [facility]    There were no vitals filed for this visit.  Visit Diagnosis:  Abnormality of gait  Weakness generalized  Balance problems  Unsteadiness      Subjective Assessment - 11/09/15 1105     Subjective No new complaints. No pain or falls to report. Reports he is doing his HEP at home now without issues.    Pain Score 0-No pain      Treatment: Neuro Re-ed: Red mats without UE support: min guard assist to min assist. Performed each 3 laps each way. Cues on posture and ex form/technique. High knee marching fwd/bwd Toe walk fwd/bwd Heel walk fwd/bwd  With 6 cones along edge of mat: - alternating toe tap to each with side stepping x 1 lap each way - alternating double toe tap to each with side stepping x 1 lap each way - alternating flip over/up to each with side stepping x 1 lap each way  In parallel bars Blue foam balance beam - alternating forward heel taps x 10 each side - alternating backward toe taps x 10 each side - alternating large forward stepping form foam>floor>back to foam x 10 each side -alternating large backward stepping from foam>floor>back to foam x 10 each side  balance board: performed each both ways on board with intermittent need of UE support and min assist - static hold with eyes open, no head movements, no UE suppor - rocking with emphasis on upright posture/positioning,  no UE support         PT Long Term Goals - 10/22/15 1315    PT LONG TERM GOAL #1   Title Patient demonstrates understanding of ongoing fitness plan / HEP. (Target Date: 11/21/2015)   Time 1   Period Months   Status New   PT LONG TERM GOAL #2   Title Berg Balance >/= 52/56  (Target Date: 11/21/2015)   Time 1   Period Months   Status New   PT LONG TERM GOAL #3   Title Functional Gait Assessment >/= 25/30  (Target Date: 11/21/2015)   Time 1   Period Months   Status New           Plan - 11/09/15 1106    Clinical Impression Statement Skilled session focued on balance , emphasizing single leg stance and hip strategies without any issues reported. Pt with posterior loss of balance several times with balance board, needed assist to regain (either bars or PTA assist). Pt  still with difficulty isolating hip reactions for balance recovery. Pt is making slow, steady progress toward goals.                    Pt will benefit from skilled therapeutic intervention in order to improve on the following deficits Abnormal gait;Decreased activity tolerance;Decreased balance;Decreased endurance;Decreased mobility;Decreased strength   Rehab Potential Good   PT Frequency 2x / week   PT Duration Other (comment)  5 weeks (1 month)   PT Treatment/Interventions ADLs/Self Care Home Management;Gait training;Stair training;Functional mobility training;Therapeutic activities;Therapeutic exercise;Balance training;Neuromuscular re-education;Patient/family education   PT Next Visit Plan continue balance and hip strategy/stepping strategy and ankle/hip strength         Problem List Patient Active Problem List   Diagnosis Date Noted  . Essential hypertension, benign 06/16/2012  . CAD (coronary artery disease) 05/27/2011  . Bladder cancer ([place]) 05/27/2011  . Alkaline phosphatase raised 05/27/2011  . Osteopenia 05/27/2011  . THYROID FUNCTION TEST, ABNORMAL 04/10/2009  . SKIN CANCER, HX OF 02/28/2009  . COLONIC POLYPS, HX OF 02/28/2009  . ELEVATED PROSTATE SPECIFIC ANTIGEN 03/28/2008  . HYPERLIPIDEMIA 01/31/2008  . HYPERPLASIA PROSTATE UNS W/O UR OBST & OTH LUTS 01/31/2008  . IMPOTENCE OF ORGANIC ORIGIN 01/31/2008  . ASTHMA 09/28/2007  . GERD 07/22/2007  . NEPHROLITHIASIS, HX OF 07/22/2007    Willow Ora 11/09/2015, 3:07 PM  Willow Ora, PTA, [facility] [address], [place], [place] [address] [phone] 11/09/2015, 3:07 PM   Name: [name] [name] MRN: [id number] Date of Birth: 12/04/1928

## 2015-11-13 ENCOUNTER — Ambulatory Visit: Payer: Medicare Other | Admitting: Physical Therapy

## 2015-11-13 ENCOUNTER — Encounter: Payer: Self-pay | Admitting: Physical Therapy

## 2015-11-13 DIAGNOSIS — R531 Weakness: Secondary | ICD-10-CM

## 2015-11-13 DIAGNOSIS — R29818 Other symptoms and signs involving the nervous system: Secondary | ICD-10-CM | POA: Diagnosis not present

## 2015-11-13 DIAGNOSIS — R269 Unspecified abnormalities of gait and mobility: Secondary | ICD-10-CM | POA: Diagnosis not present

## 2015-11-13 DIAGNOSIS — R2681 Unsteadiness on feet: Secondary | ICD-10-CM

## 2015-11-13 DIAGNOSIS — R2689 Other abnormalities of gait and mobility: Secondary | ICD-10-CM

## 2015-11-13 NOTE — Therapy (Signed)
[place] [address] [place], [place], [address] Phone: [phone]   Fax:  [phone]  Physical Therapy Treatment  Patient Details  Name: [name] [name] MRN: [id number] Date of Birth: 03/07/1929 Referring Provider: Dr. Wells Guiles Tat  Encounter Date: 11/13/2015      PT End of Session - 11/13/15 1400    Visit Number 6   Number of Visits 10   Date for PT Re-Evaluation 11/21/15   Authorization Type Medicare G-code every 10th visit    PT Start Time 1315   PT Stop Time 1400   PT Time Calculation (min) 45 min   Activity Tolerance Patient tolerated treatment well   Behavior During Therapy [facility] for tasks assessed/performed      Past Medical History  Diagnosis Date  . CAD (coronary artery disease)     a. s/p Cypher DES x 2 to prox LAD and Cypher DES x 1 to distal LAD 08/2009;  b. cath with residual D2 50% 10/05 and normal EF;  c. Myoview 6/11: EF 60%, mild fixed INF, inferoseptal, inferoapical thinning, no ischemia, low risk, EF 60%  . HLD (hyperlipidemia)     h/o elevated LFTs on Zocor  . GERD (gastroesophageal reflux disease)   . Asthma     post infectious  . Skin cancer, basal cell     Dr Sherrye Payor  . Nephrolithiasis   . Colon polyps   . Bladder cancer [facility])     Dr Estill Dooms, [facility]  . Other abnormal glucose 2012    FBS 104    Past Surgical History  Procedure Laterality Date  . Coronary artery stenting  2005    Dr. Olevia Perches, 3 vessel  . Lithotripsy  2002  . Colonoscopy with polypectomy  2005    Dr. Verl Blalock  . Upper gastrointestinal endoscopy  2005    Esophageal reflux, hiatal hernia  . Bladder tumor excision  2011, 2012, 2013    Dr Estill Dooms, [facility]  . Cataract extraction  2003    Bilateral, [facility]    There were no vitals filed for this visit.  Visit Diagnosis:  Abnormality of gait  Weakness generalized  Balance  problems  Unsteadiness                      Therapeutic Exercise: See pt education.  Gait Training: Treadmill 3.70mph X 2 minutes with PT cueing safety; reduced speed to 2.22mph and worked on scanning; Used Nurse, learning disability for 26minutes at 3.10mph with avg speed 1.40m/sec; step length Lt 71cm, Rt 75cm; coefficient of variation and time on each LE WNL Followed by overground gait working on step length and scanning.          PT Education - 11/13/15 1400    Education provided Yes   Education Details components of fitness plan: endurance, balance, strength & flexibility; stretches for heelcords, hamstrings & trunk rotation sitting, standing at counter & stairs.   Person(s) Educated Patient   Methods Explanation;Demonstration;Verbal cues   Comprehension Verbalized understanding;Returned demonstration;Verbal cues required             PT Long Term Goals - 10/22/15 1315    PT LONG TERM GOAL #1   Title Patient demonstrates understanding of ongoing fitness plan / HEP. (Target Date: 11/21/2015)   Time 1   Period Months   Status New   PT LONG TERM GOAL #2   Title Berg Balance >/= 52/56  (Target Date: 11/21/2015)  Time 1   Period Months   Status New   PT LONG TERM GOAL #3   Title Functional Gait Assessment >/= 25/30  (Target Date: 11/21/2015)   Time 1   Period Months   Status New               Plan - 11/13/15 1400    Clinical Impression Statement Feedback of Gait Trainer program on Treadmill improved gait with longer step length to match his velocity. Patient appears to understand stretches and components of fitness plan.    Pt will benefit from skilled therapeutic intervention in order to improve on the following deficits Abnormal gait;Decreased activity tolerance;Decreased balance;Decreased endurance;Decreased mobility;Decreased strength   Rehab Potential Good   PT Frequency 2x / week   PT Duration Other (comment)  5 weeks (1 month)   PT  Treatment/Interventions ADLs/Self Care Home Management;Gait training;Stair training;Functional mobility training;Therapeutic activities;Therapeutic exercise;Balance training;Neuromuscular re-education;Patient/family education   PT Next Visit Plan Assess LTGs   Consulted and Agree with Plan of Care Patient        Problem List Patient Active Problem List   Diagnosis Date Noted  . Essential hypertension, benign 06/16/2012  . CAD (coronary artery disease) 05/27/2011  . Bladder cancer ([place]) 05/27/2011  . Alkaline phosphatase raised 05/27/2011  . Osteopenia 05/27/2011  . THYROID FUNCTION TEST, ABNORMAL 04/10/2009  . SKIN CANCER, HX OF 02/28/2009  . COLONIC POLYPS, HX OF 02/28/2009  . ELEVATED PROSTATE SPECIFIC ANTIGEN 03/28/2008  . HYPERLIPIDEMIA 01/31/2008  . HYPERPLASIA PROSTATE UNS W/O UR OBST & OTH LUTS 01/31/2008  . IMPOTENCE OF ORGANIC ORIGIN 01/31/2008  . ASTHMA 09/28/2007  . GERD 07/22/2007  . NEPHROLITHIASIS, HX OF 07/22/2007    Jamey Reas PT, DPT 11/13/2015, 3:23 PM  [facility] [address] [place], [place], [address] Phone: [phone]   Fax:  [phone]  Name: [name] [name] MRN: [id number] Date of Birth: [birth year]

## 2015-11-15 ENCOUNTER — Ambulatory Visit: Payer: Medicare Other | Admitting: Physical Therapy

## 2015-11-15 ENCOUNTER — Encounter: Payer: Self-pay | Admitting: Physical Therapy

## 2015-11-15 DIAGNOSIS — R2681 Unsteadiness on feet: Secondary | ICD-10-CM

## 2015-11-15 DIAGNOSIS — R531 Weakness: Secondary | ICD-10-CM | POA: Diagnosis not present

## 2015-11-15 DIAGNOSIS — R29818 Other symptoms and signs involving the nervous system: Secondary | ICD-10-CM | POA: Diagnosis not present

## 2015-11-15 DIAGNOSIS — R2689 Other abnormalities of gait and mobility: Secondary | ICD-10-CM

## 2015-11-15 DIAGNOSIS — R269 Unspecified abnormalities of gait and mobility: Secondary | ICD-10-CM

## 2015-11-15 NOTE — Therapy (Signed)
[facility] [address] [place], [place], [address] Phone: [phone]   Fax:  [phone]  Physical Therapy Treatment  Patient Details  Name: [name] [name] MRN: [id number] Date of Birth: August 03, 1929 Referring Provider: Dr. Wells Guiles Tat  Encounter Date: 11/15/2015  PHYSICAL THERAPY DISCHARGE SUMMARY  Visits from Start of Care: 7  Current functional level related to goals / functional outcomes: See below   Remaining deficits: See below   Education / Equipment: Ongoing fitness plan / HEP.  Plan: Patient agrees to discharge.  Patient goals were partially met. Patient is being discharged due to meeting the stated rehab goals.  ?????            PT End of Session - 11/15/15 1145    Visit Number 7   Number of Visits 10   Date for PT Re-Evaluation 11/21/15   Authorization Type Medicare G-code every 10th visit    PT Start Time 1100   PT Stop Time 1146   PT Time Calculation (min) 46 min   Activity Tolerance Patient tolerated treatment well   Behavior During Therapy [facility] for tasks assessed/performed      Past Medical History  Diagnosis Date  . CAD (coronary artery disease)     a. s/p Cypher DES x 2 to prox LAD and Cypher DES x 1 to distal LAD 08/2009;  b. cath with residual D2 50% 10/05 and normal EF;  c. Myoview 6/11: EF 60%, mild fixed INF, inferoseptal, inferoapical thinning, no ischemia, low risk, EF 60%  . HLD (hyperlipidemia)     h/o elevated LFTs on Zocor  . GERD (gastroesophageal reflux disease)   . Asthma     post infectious  . Skin cancer, basal cell     Dr Sherrye Payor  . Nephrolithiasis   . Colon polyps   . Bladder cancer [facility])     Dr Estill Dooms, [facility]  . Other abnormal glucose 2012    FBS 104    Past Surgical History  Procedure Laterality Date  . Coronary artery stenting  2005    Dr. Olevia Perches, 3 vessel  . Lithotripsy  2002  . Colonoscopy with polypectomy  2005    Dr. Verl Blalock  .  Upper gastrointestinal endoscopy  2005    Esophageal reflux, hiatal hernia  . Bladder tumor excision  2011, 2012, 2013    Dr Estill Dooms, [facility]  . Cataract extraction  2003    Bilateral, [facility]    There were no vitals filed for this visit.  Visit Diagnosis:  Abnormality of gait  Weakness generalized  Balance problems  Unsteadiness      Subjective Assessment - 11/15/15 1106    Subjective No new issues. No falls.    Currently in Pain? No/denies            [facility] PT Assessment - 11/15/15 1100    Ambulation/Gait   Ambulation/Gait Yes   Berg Balance Test   Sit to Stand Able to stand without using hands and stabilize independently   Standing Unsupported Able to stand safely 2 minutes   Sitting with Back Unsupported but Feet Supported on Floor or Stool Able to sit safely and securely 2 minutes   Stand to Sit Sits safely with minimal use of hands   Transfers Able to transfer safely, minor use of hands   Standing Unsupported with Eyes Closed Able to stand 10 seconds safely   Standing Ubsupported with Feet Together Able to place feet together independently  and stand 1 minute safely   From Standing, Reach Forward with Outstretched Arm Can reach confidently >25 cm (10")   From Standing Position, Pick up Object from [place] to pick up shoe safely and easily   From Standing Position, Turn to Look Behind Over each Shoulder Looks behind from both sides and weight shifts well   Turn 360 Degrees Able to turn 360 degrees safely but slowly   Standing Unsupported, Alternately Place Feet on Step/Stool Able to stand independently and safely and complete 8 steps in 20 seconds   Standing Unsupported, One Foot in Front Able to plae foot ahead of the other independently and hold 30 seconds   Standing on One Leg Able to lift leg independently and hold equal to or more than 3 seconds   Total Score 51   Functional Gait  Assessment   Gait assessed  Yes   Gait Level Surface Walks 20 ft in less  than 5.5 sec, no assistive devices, good speed, no evidence for imbalance, normal gait pattern, deviates no more than 6 in outside of the 12 in walkway width.   Change in Gait Speed Able to smoothly change walking speed without loss of balance or gait deviation. Deviate no more than 6 in outside of the 12 in walkway width.   Gait with Horizontal Head Turns Performs head turns smoothly with slight change in gait velocity (eg, minor disruption to smooth gait path), deviates 6-10 in outside 12 in walkway width, or uses an assistive device.   Gait with Vertical Head Turns Performs head turns with no change in gait. Deviates no more than 6 in outside 12 in walkway width.   Gait and Pivot Turn Pivot turns safely within 3 sec and stops quickly with no loss of balance.   Step Over Obstacle Is able to step over 2 stacked shoe boxes taped together (9 in total height) without changing gait speed. No evidence of imbalance.   Gait with Narrow Base of Support Ambulates 4-7 steps.   Gait with Eyes Closed Walks 20 ft, no assistive devices, good speed, no evidence of imbalance, normal gait pattern, deviates no more than 6 in outside 12 in walkway width. Ambulates 20 ft in less than 7 sec.   Ambulating Backwards Walks 20 ft, uses assistive device, slower speed, mild gait deviations, deviates 6-10 in outside 12 in walkway width.   Steps Alternating feet, must use rail.   Total Score 25                     [facility] Adult PT Treatment/Exercise - 11/15/15 1100    Ambulation/Gait   Ambulation/Gait Assistance 6: Modified independent (Device/Increase time)   Ambulation Distance (Feet) 500 Feet   Assistive device None   Ambulation Surface Indoor;Level   Gait velocity 3.88 ft/sec   Stairs Yes   Stairs Assistance 6: Modified independent (Device/Increase time)   Stair Management Technique One rail Right;Alternating pattern;Forwards   Number of Stairs 4   Ramp 6: Modified independent (Device)  no device   Curb  6: Modified independent (Device/increase time)  no device                PT Education - 11/15/15 1145    Education provided Yes   Education Details ongoing fitness plan; new shoes to improve foot stability (current walking shoes are >5 yrs old and loafers he wears at work are >10 yrs old); use of walking stick for hiking / walks on trials.  Person(s) Educated Patient   Methods Explanation   Comprehension Verbalized understanding             PT Long Term Goals - 2015-11-25 1145    PT LONG TERM GOAL #1   Title Patient demonstrates understanding of ongoing fitness plan / HEP. (Target Date: 11/21/2015)   Time 1   Period Months   Status Achieved   PT LONG TERM GOAL #2   Title Berg Balance >/= 52/56  (Target Date: 11/21/2015)   Baseline Partially MET 11/25/15  Berg Balance improved from 47/56 to 51/56.    Time 1   Period Months   Status Partially Met   PT LONG TERM GOAL #3   Title Functional Gait Assessment >/= 25/30  (Target Date: 11/21/2015)   Baseline MET 11-25-15 FGA improved from 21/30 to 25/30   Time 1   Period Months   Status Achieved               Plan - 25-Nov-2015 1150    Clinical Impression Statement Patient met or partially met all LTGs. He appears ready for discharge today. He understands need for ongoing fitness plan. He also reports understanding of recommendation for proper shoe wear to improve stablity with balance issues. Patient reports he feels like he is walking better.    Pt will benefit from skilled therapeutic intervention in order to improve on the following deficits Abnormal gait;Decreased activity tolerance;Decreased balance;Decreased endurance;Decreased mobility;Decreased strength   Rehab Potential Good   PT Frequency 2x / week   PT Duration Other (comment)  5 weeks (1 month)   PT Treatment/Interventions ADLs/Self Care Home Management;Gait training;Stair training;Functional mobility training;Therapeutic activities;Therapeutic  exercise;Balance training;Neuromuscular re-education;Patient/family education   PT Next Visit Plan Discharge PT   Consulted and Agree with Plan of Care Patient          G-Codes - 2015-11-25 1150    Functional Assessment Tool Used Berg Balance 51/56 and Functional Gait Assessment 25/30   Functional Limitation Mobility: Walking and moving around   Mobility: Walking and Moving Around Goal Status [phone]) At least 20 percent but less than 40 percent impaired, limited or restricted   Mobility: Walking and Moving Around Discharge Status [phone]) At least 20 percent but less than 40 percent impaired, limited or restricted      Problem List Patient Active Problem List   Diagnosis Date Noted  . Essential hypertension, benign 06/16/2012  . CAD (coronary artery disease) 05/27/2011  . Bladder cancer ([place]) 05/27/2011  . Alkaline phosphatase raised 05/27/2011  . Osteopenia 05/27/2011  . THYROID FUNCTION TEST, ABNORMAL 04/10/2009  . SKIN CANCER, HX OF 02/28/2009  . COLONIC POLYPS, HX OF 02/28/2009  . ELEVATED PROSTATE SPECIFIC ANTIGEN 03/28/2008  . HYPERLIPIDEMIA 01/31/2008  . HYPERPLASIA PROSTATE UNS W/O UR OBST & OTH LUTS 01/31/2008  . IMPOTENCE OF ORGANIC ORIGIN 01/31/2008  . ASTHMA 09/28/2007  . GERD 07/22/2007  . NEPHROLITHIASIS, HX OF 07/22/2007    Jamey Reas PT, DPT 2015/11/25, 2:10 PM  [facility] [address] [place], [place], [address] Phone: [phone]   Fax:  [phone]  Name: [name] HOFFERBER MRN: [id number] Date of Birth: Jul 13, 1929

## 2015-11-15 NOTE — Patient Instructions (Signed)
All stretches should be held 30-45 seconds 2-3 reps on each leg.    Chair Sitting    Sit at edge of seat, spine straight, one leg extended. Put a hand on each thigh and bend forward from the hip, keeping spine straight. Allow hand on extended leg to reach toward toes. Support upper body with other arm. Hold ___ seconds. Repeat ___ times per session. Do ___ sessions per day.  Copyright  VHI. All rights reserved.  Flexion: Stretch - Quadriceps (Side-Lying)    Position Patient: Lie with left side up. Helper: Stabilize hip. Other hand grasps ankle. Motion - Helper presses heel toward buttock, keeping thigh in line with body. CAUTION: Patient should feel stretch along front of thigh. There should be no pain in knee joint. Hold ___ seconds. Repeat ___ times. Repeat with other leg. Do ___ sessions per day. Variation: Contract method: Resist ___ seconds. (see card G.G.-14)  Copyright  VHI. All rights reserved.  Hamstring Stretch    Stand with one heel on step, knee straight. Lean forward from hip. Hold ___ seconds. Perform ___ reps.  Copyright  VHI. All rights reserved.  Hamstring Stretch (Sitting)    Sitting, extend one leg and place hands on same thigh for support. Keeping torso straight, lean forward, sliding hands down leg, until a stretch is felt in back of thigh. Hold ____ seconds. Repeat with other leg.  Copyright  VHI. All rights reserved.  Calf Stretch    Place one leg forward, bent, other leg behind and straight. Lean forward keeping back heel flat. Hold ____ seconds while counting out loud. Repeat with other leg forward. Repeat ____ times. Do ____ sessions per day.  http://gt2.exer.us/477   Copyright  VHI. All rights reserved.  Calf Stretch ([place])    Gently bend knees slightly and move one foot back. Lean into wall so that stretch is felt in back lower calf. Hold ____ seconds. Repeat with other leg back. Repeat ____ times. Do ____ sessions per  day.  http://gt2.exer.us/417   Copyright  VHI. All rights reserved.  Gastroc / Heel Cord Stretch - On Step    Stand with heels over edge of stair. Holding rail, lower heels until stretch is felt in calf of legs. Repeat ___ times. Do ___ times per day.  Copyright  VHI. All rights reserved.  Gastroc / Heel Cord Stretch - Seated With Towel    Sit on floor, towel around ball of foot. Gently pull foot in toward body, stretching heel cord and calf. Hold for ___ seconds. Repeat on involved leg. Repeat ___ times. Do ___ times per day.  Copyright  VHI. All rights reserved.  Gastroc Stretch - Standing    Stand with uninvolved leg straight, heel on floor, toes pointed slightly inward. Lean into wall until stretch is felt in calf. Hold for ___ seconds. Repeat on involved leg. Repeat ___ times. Do ___ times per day.  Copyright  VHI. All rights reserved.  Gastroc Stretch    Stand with right foot back, leg straight, forward leg bent. Keeping heel on floor, turned slightly out, lean into wall until stretch is felt in calf. Hold ____ seconds. Repeat ____ times per set. Do ____ sets per session. Do ____ sessions per day.  http://orth.exer.us/26   Copyright  VHI. All rights reserved.

## 2015-12-11 ENCOUNTER — Telehealth: Payer: Self-pay | Admitting: Internal Medicine

## 2015-12-11 MED ORDER — FLUTICASONE PROPIONATE 50 MCG/ACT NA SUSP: NASAL | Status: DC

## 2015-12-11 NOTE — Telephone Encounter (Signed)
Refill sent.../lmb 

## 2015-12-11 NOTE — Telephone Encounter (Signed)
[facility] on [address] is following up on refill request for fluticasone (FLONASE) 50 MCG/ACT nasal spray [id number]

## 2015-12-11 NOTE — Addendum Note (Signed)
Addended by: Earnstine Regal on: 12/11/2015 12:55 PM   Modules accepted: Orders

## 2015-12-18 DIAGNOSIS — M7061 Trochanteric bursitis, right hip: Secondary | ICD-10-CM | POA: Diagnosis not present

## 2016-01-02 DIAGNOSIS — H6123 Impacted cerumen, bilateral: Secondary | ICD-10-CM | POA: Diagnosis not present

## 2016-01-02 DIAGNOSIS — H903 Sensorineural hearing loss, bilateral: Secondary | ICD-10-CM | POA: Diagnosis not present

## 2016-01-09 ENCOUNTER — Other Ambulatory Visit: Payer: Self-pay | Admitting: Cardiovascular Disease

## 2016-01-11 ENCOUNTER — Other Ambulatory Visit (HOSPITAL_COMMUNITY): Payer: Self-pay

## 2016-01-11 MED ORDER — CLOPIDOGREL BISULFATE 75 MG PO TABS: 75.0000 mg | ORAL_TABLET | Freq: Every day | ORAL | Status: DC

## 2016-01-17 ENCOUNTER — Encounter: Payer: Self-pay | Admitting: Internal Medicine

## 2016-01-17 ENCOUNTER — Ambulatory Visit (INDEPENDENT_AMBULATORY_CARE_PROVIDER_SITE_OTHER): Payer: Medicare Other | Admitting: Internal Medicine

## 2016-01-17 VITALS — BP 162/74 | HR 58 | Temp 97.6°F | Resp 16 | Wt 149.0 lb

## 2016-01-17 DIAGNOSIS — I2510 Atherosclerotic heart disease of native coronary artery without angina pectoris: Secondary | ICD-10-CM | POA: Diagnosis not present

## 2016-01-17 DIAGNOSIS — J45909 Unspecified asthma, uncomplicated: Secondary | ICD-10-CM

## 2016-01-17 DIAGNOSIS — E785 Hyperlipidemia, unspecified: Secondary | ICD-10-CM

## 2016-01-17 DIAGNOSIS — K219 Gastro-esophageal reflux disease without esophagitis: Secondary | ICD-10-CM | POA: Diagnosis not present

## 2016-01-17 DIAGNOSIS — I10 Essential (primary) hypertension: Secondary | ICD-10-CM

## 2016-01-17 DIAGNOSIS — C679 Malignant neoplasm of bladder, unspecified: Secondary | ICD-10-CM

## 2016-01-17 DIAGNOSIS — Z23 Encounter for immunization: Secondary | ICD-10-CM | POA: Diagnosis not present

## 2016-01-17 NOTE — Assessment & Plan Note (Signed)
BP a little elevated here today, usually a little lower Asked him to start monitoring his BP  Increase exercise Continue amlodipine at current dose

## 2016-01-17 NOTE — Progress Notes (Signed)
Subjective:    Patient ID: [name] [name], male    DOB: Mar 20, 1929, 82 y.o.   MRN: [id number]  HPI He is here to establish with a new pcp.   He is here for follow up.   CAD, Hypertension: He is taking his medication daily. He is compliant with a low sodium diet.  He denies chest pain, palpitations, edema, shortness of breath and regular headaches. He is exercising regularly.  He does not monitor his blood pressure at home.    Hyperlipidemia: He is taking his medication twice a week - he did not tolerate it daily due to muscle pain. He is compliant with a low fat/cholesterol diet. He is exercising regularly. He denies myalgias.   GERD:  He is taking his medication daily as prescribed.  He denies any GERD symptoms and feels his GERD is well controlled.   History of bladder cancer:  He is following with urology.  There is no evidence of recurrence.    Asthma:  He rarely uses the inhaler.  He has not used it in 2-3 years.  He denies cough, wheeze and sob on a regular basis.    Medications and allergies reviewed with patient and updated if appropriate.  Patient Active Problem List   Diagnosis Date Noted  . Essential hypertension, benign 06/16/2012  . CAD (coronary artery disease) 05/27/2011  . Bladder cancer ([place]) 05/27/2011  . Osteopenia 05/27/2011  . SKIN CANCER, HX OF 02/28/2009  . COLONIC POLYPS, HX OF 02/28/2009  . ELEVATED PROSTATE SPECIFIC ANTIGEN 03/28/2008  . Hyperlipidemia 01/31/2008  . HYPERPLASIA PROSTATE UNS W/O UR OBST & OTH LUTS 01/31/2008  . IMPOTENCE OF ORGANIC ORIGIN 01/31/2008  . Asthma 09/28/2007  . GERD 07/22/2007  . NEPHROLITHIASIS, HX OF 07/22/2007    Current Outpatient Prescriptions on File Prior to Visit  Medication Sig Dispense Refill  . ADVAIR DISKUS 100-50 MCG/DOSE AEPB TAKE 1 PUFF BY MOUTH TWICE DAILY AS NEEDED 60 each 5  . amLODipine (NORVASC) 5 MG tablet TAKE 1 TABLET BY MOUTH EVERY DAY 30 tablet 0  . aspirin 81 MG tablet Take 81 mg by mouth  daily.      . clopidogrel (PLAVIX) 75 MG tablet Take 1 tablet (75 mg total) by mouth daily. 90 tablet 3  . fexofenadine (ALLEGRA) 180 MG tablet Take 180 mg by mouth as needed for allergies or rhinitis.     . fluticasone (FLONASE) 50 MCG/ACT nasal spray PLACE TWO SPRAYS INTO THE NOSE AT BEDTIME Must keep 01/22/16 appt for future refills 16 g 1  . MYRBETRIQ 50 MG TB24 tablet Take 50 mg by mouth daily.     . nitroGLYCERIN (NITROSTAT) 0.4 MG SL tablet Place 1 tablet (0.4 mg total) under the tongue every 5 (five) minutes as needed. 25 tablet 3  . ranitidine (ZANTAC) 150 MG tablet TAKE 1 TABLET(150 MG) BY MOUTH TWICE DAILY 180 tablet 1  . rosuvastatin (CRESTOR) 5 MG tablet Take 1 tablet by mouth daily on Monday, Wednesday and Friday 45 tablet 3  . sucralfate (CARAFATE) 1 G tablet TAKE 1 TABLET THREE TIMES DAILY WITH MEALS. 270 tablet 0  . VIAGRA 100 MG tablet TAKE ONE-HALF TO 1 TABLET BY MOUTH ONCE DAILY AS NEEDED 6 tablet 0   No current facility-administered medications on file prior to visit.    Past Medical History  Diagnosis Date  . CAD (coronary artery disease)     a. s/p Cypher DES x 2 to prox LAD and Cypher DES x  1 to distal LAD 08/2009;  b. cath with residual D2 50% 10/05 and normal EF;  c. Myoview 6/11: EF 60%, mild fixed INF, inferoseptal, inferoapical thinning, no ischemia, low risk, EF 60%  . HLD (hyperlipidemia)     h/o elevated LFTs on Zocor  . GERD (gastroesophageal reflux disease)   . Asthma     post infectious  . Skin cancer, basal cell     Dr Sherrye Payor  . Nephrolithiasis   . Colon polyps   . Bladder cancer [facility])     Dr Estill Dooms, [facility]  . Other abnormal glucose 2012    FBS 104    Past Surgical History  Procedure Laterality Date  . Coronary artery stenting  2005    Dr. Olevia Perches, 3 vessel  . Lithotripsy  2002  . Colonoscopy with polypectomy  2005    Dr. Verl Blalock  . Upper gastrointestinal endoscopy  2005    Esophageal reflux, hiatal hernia  . Bladder tumor  excision  2011, 2012, 2013    Dr Estill Dooms, [facility]  . Cataract extraction  2003    Bilateral, [facility]    Social History   Social History  . Marital Status: Married    Spouse Name: N/A  . Number of Children: N/A  . Years of Education: N/A   Social History Main Topics  . Smoking status: Former Smoker    Types: Cigarettes    Quit date: 11/17/1960  . Smokeless tobacco: None     Comment: smoked 1948-1962, up to 1/2 ppd  . Alcohol Use: 6.0 oz/week    10 Glasses of wine per week     Comment: wine @ dinner  . Drug Use: No  . Sexual Activity: Not Asked   Other Topics Concern  . None   Social History Narrative    Family History  Problem Relation Age of Onset  . Hypertension Mother   . Stroke Mother     in 95s  . Coronary artery disease Father     79s  . Asthma Brother   . Diabetes Brother   . Alcohol abuse Brother   . Prostate cancer Maternal Grandfather   . Heart disease Paternal Grandmother     in 29s  . Heart attack Mother     4    Review of Systems  Constitutional: Negative for fever, chills and fatigue.  Respiratory: Negative for cough, shortness of breath and wheezing.   Cardiovascular: Negative for chest pain, palpitations and leg swelling.  Gastrointestinal:       GERD controlled  Neurological: Negative for dizziness, light-headedness and headaches.       Objective:   Filed Vitals:   01/17/16 0835  BP: 162/74  Pulse: 58  Temp: 97.6 F (36.4 C)  Resp: 16   Filed Weights   01/17/16 0835  Weight: 149 lb (67.586 kg)   Body mass index is 24.06 kg/(m^2).   Physical Exam Constitutional: Appears well-developed and well-nourished. No distress.  Neck: Neck supple. No tracheal deviation present. No thyromegaly present.  No carotid bruit. No cervical adenopathy.   Cardiovascular: Normal rate, regular rhythm and normal heart sounds.   No murmur heard.  No edema Pulmonary/Chest: Effort normal and breath sounds normal. No respiratory distress. No  wheezes.       Assessment & Plan:   CAD S/p stent Follows with cardio Asymptomatic Exercising - will try to increase On plavix, asa, crestor  See Problem List for Assessment and Plan of chronic medical problems.  Follow up in fall for a wellness visit, sooner if needed

## 2016-01-17 NOTE — Progress Notes (Signed)
Pre visit review using our clinic review tool, if applicable. No additional management support is needed unless otherwise documented below in the visit note. 

## 2016-01-17 NOTE — Addendum Note (Signed)
Addended by: Terence Lux B on: 01/17/2016 10:17 AM   Modules accepted: Orders

## 2016-01-17 NOTE — Assessment & Plan Note (Signed)
Taking zantac and carafate daily  Fairly controlled, but needs both medications Continue same

## 2016-01-17 NOTE — Assessment & Plan Note (Signed)
Taking crestor twice a week - can not tolerate more due to muscle pain, intolerance of other statins Continue crestor twice daily Exercising, but will try to increase his exercise

## 2016-01-17 NOTE — Assessment & Plan Note (Signed)
No evidence of recurrence Follows with urology 

## 2016-01-17 NOTE — Patient Instructions (Addendum)
  We have reviewed your prior records including labs and tests today.  Test(s) ordered today. Your results will be released to [place] (or called to you) after review, usually within 72hours after test completion. If any changes need to be made, you will be notified at that same time.  All other Health Maintenance issues reviewed.   All recommended immunizations and age-appropriate screenings are up-to-date.  No immunizations administered today.   Medications reviewed and updated.  No changes recommended at this time.   Please followup in about 7 months for a wellness visit

## 2016-01-17 NOTE — Assessment & Plan Note (Signed)
Miild, intermittent - developed after CAP - more likely RAD Has not used advair in 2-3 years asymptomatic

## 2016-02-29 ENCOUNTER — Other Ambulatory Visit: Payer: Self-pay | Admitting: Internal Medicine

## 2016-03-18 ENCOUNTER — Other Ambulatory Visit: Payer: Self-pay | Admitting: Cardiovascular Disease

## 2016-03-19 ENCOUNTER — Ambulatory Visit: Payer: Medicare Other | Admitting: Neurology

## 2016-03-27 ENCOUNTER — Ambulatory Visit (INDEPENDENT_AMBULATORY_CARE_PROVIDER_SITE_OTHER): Payer: Medicare Other | Admitting: Cardiovascular Disease

## 2016-03-27 ENCOUNTER — Encounter: Payer: Self-pay | Admitting: Cardiovascular Disease

## 2016-03-27 VITALS — BP 150/70 | HR 62 | Ht 67.0 in | Wt 144.8 lb

## 2016-03-27 DIAGNOSIS — I25119 Atherosclerotic heart disease of native coronary artery with unspecified angina pectoris: Secondary | ICD-10-CM | POA: Diagnosis not present

## 2016-03-27 MED ORDER — NITROGLYCERIN 0.4 MG SL SUBL: 0.4000 mg | SUBLINGUAL_TABLET | SUBLINGUAL | Status: AC | PRN

## 2016-03-27 NOTE — Patient Instructions (Signed)
Medication Instructions:  Your physician recommends that you continue on your current medications as directed. Please refer to the Current Medication list given to you today.  Nitroglycerin prescription sent to the pharmacy.   Labwork: No new orders.   Testing/Procedures: Your physician has requested that you have an exercise stress myoview. For further information please visit [email or web address]. Please follow instruction sheet, as given.  Follow-Up: Your physician wants you to follow-up in: 1 YEAR with Dr Burt Knack.  You will receive a reminder letter in the mail two months in advance. If you don't receive a letter, please call our office to schedule the follow-up appointment.   Any Other Special Instructions Will Be Listed Below (If Applicable).     If you need a refill on your cardiac medications before your next appointment, please call your pharmacy.

## 2016-03-27 NOTE — Progress Notes (Signed)
Cardiology Office Note Date:  03/27/2016   ID:  [name] [name], DOB [birth year], MRN [id number]  PCP:  Binnie Rail, MD  Cardiologist:  Sherren Mocha, MD    Chief Complaint  Patient presents with  . essential hypertension    no cp,sob,lee,or claudication  . cad native artery     History of Present Illness: [name] [name] is a 82 y.o. male who presents for followup of coronary artery disease. The patient presented with unstable angina in 2005 and was treated with overlapping drug-eluting stents in the proximal LAD and a third stent in the distal LAD. He had no significant disease in his left circumflex or right coronary artery.  The patient is doing relatively well. He does have a lot of aches and pains and is uncertain whether these are related to his heart. He complains of an upper back pain intermittently. He also has some pains that radiate down his arms. He has had occasional discomfort in his chest, but also no relation to physical exertion. He is able to walk on a treadmill without symptoms. He denies edema, orthopnea, or PND. He feels unsteady on his feet but does okay on the treadmill as long as he has something to hold onto.  Past Medical History  Diagnosis Date  . CAD (coronary artery disease)     a. s/p Cypher DES x 2 to prox LAD and Cypher DES x 1 to distal LAD 08/2009;  b. cath with residual D2 50% 10/05 and normal EF;  c. Myoview 6/11: EF 60%, mild fixed INF, inferoseptal, inferoapical thinning, no ischemia, low risk, EF 60%  . HLD (hyperlipidemia)     h/o elevated LFTs on Zocor  . GERD (gastroesophageal reflux disease)   . Asthma     post infectious  . Skin cancer, basal cell     Dr Sherrye Payor  . Nephrolithiasis   . Colon polyps   . Bladder cancer [facility])     Dr Estill Dooms, [employer]  . Other abnormal glucose 2012    FBS 104    Past Surgical History  Procedure Laterality Date  . Coronary artery stenting  2005    Dr. Olevia Perches, 3 vessel  . Lithotripsy  2002  .  Colonoscopy with polypectomy  2005    Dr. Verl Blalock  . Upper gastrointestinal endoscopy  2005    Esophageal reflux, hiatal hernia  . Bladder tumor excision  2011, 2012, 2013    Dr Estill Dooms, [facility]  . Cataract extraction  2003    Bilateral, [facility]    Current Outpatient Prescriptions  Medication Sig Dispense Refill  . amLODipine (NORVASC) 5 MG tablet TAKE 1 TABLET BY MOUTH EVERY DAY 30 tablet 0  . aspirin 81 MG tablet Take 81 mg by mouth daily.      . clopidogrel (PLAVIX) 75 MG tablet Take 1 tablet (75 mg total) by mouth daily. 90 tablet 3  . fexofenadine (ALLEGRA) 180 MG tablet Take 180 mg by mouth daily as needed for allergies or rhinitis.     . fluticasone (FLONASE) 50 MCG/ACT nasal spray PLACE TWO SPRAYS INTO THE NOSE AT BEDTIME Must keep 01/22/16 appt for future refills 16 g 1  . Fluticasone-Salmeterol (ADVAIR) 100-50 MCG/DOSE AEPB Inhale 1 puff into the lungs 2 (two) times daily as needed (sob or wheezing).    . MYRBETRIQ 50 MG TB24 tablet Take 50 mg by mouth daily.     . nitroGLYCERIN (NITROSTAT) 0.4 MG SL tablet Place 0.4 mg  under the tongue every 5 (five) minutes as needed for chest pain.    . ranitidine (ZANTAC) 150 MG tablet TAKE 1 TABLET(150 MG) BY MOUTH TWICE DAILY 180 tablet 1  . rosuvastatin (CRESTOR) 5 MG tablet Take 1 tablet by mouth daily on Monday, Wednesday and Friday 45 tablet 3  . sildenafil (VIAGRA) 100 MG tablet Take 50 mg by mouth daily as needed for erectile dysfunction.    . sucralfate (CARAFATE) 1 g tablet Take 1 g by mouth 4 (four) times daily -  with meals and at bedtime.     No current facility-administered medications for this visit.    Allergies:   Hydromorphone hcl; Phenytoin; Zocor; Crestor; Hydromorphone hcl; and Statins   Social History:  The patient  reports that he quit smoking about 55 years ago. His smoking use included Cigarettes. He does not have any smokeless tobacco history on file. He reports that he drinks about 6.0 oz of alcohol per  week. He reports that he does not use illicit drugs.   Family History:  The patient's  family history includes Alcohol abuse in his brother; Asthma in his brother; Coronary artery disease in his father; Diabetes in his brother; Heart attack in his mother; Heart disease in his paternal grandmother; Hypertension in his mother; Prostate cancer in his maternal grandfather; Stroke in his mother.    ROS:  Please see the history of present illness.  All other systems are reviewed and negative.    PHYSICAL EXAM: VS:  BP 150/70 mmHg  Pulse 62  Ht 5\' 7"  (1.702 m)  Wt 144 lb 12.8 oz (65.681 kg)  BMI 22.67 kg/m2 , BMI Body mass index is 22.67 kg/(m^2). GEN: Well nourished, well developed, pleasant elderly male in no acute distress HEENT: normal Neck: no JVD, no masses. No carotid bruits Cardiac: RRR without murmur or gallop                Respiratory:  clear to auscultation bilaterally, normal work of breathing GI: soft, nontender, nondistended, + BS MS: no deformity or atrophy Ext: no pretibial edema, pedal pulses 2+= bilaterally Skin: warm and dry, no rash Neuro:  Strength and sensation are intact Psych: euthymic mood, full affect  EKG:  EKG is ordered today. The ekg ordered today shows normal sinus rhythm 62 bpm, within normal limits.  Recent Labs: 07/30/2015: ALT 20 09/18/2015: BUN 18; Creatinine, Ser 1.01; Potassium 3.5; Sodium 142; TSH 3.75   Lipid Panel     Component Value Date/Time   CHOL 155 07/30/2015 0737   TRIG 41.0 07/30/2015 0737   TRIG 39 10/21/2006 1059   HDL 68.70 07/30/2015 0737   CHOLHDL 2 07/30/2015 0737   CHOLHDL 2.3 CALC 10/21/2006 1059   VLDL 8.2 07/30/2015 0737   LDLCALC 78 07/30/2015 0737      Wt Readings from Last 3 Encounters:  03/27/16 144 lb 12.8 oz (65.681 kg)  01/17/16 149 lb (67.586 kg)  09/20/15 149 lb (67.586 kg)     Cardiac Studies Reviewed: Nuclear Scan 08-31-2013: Impression Exercise Capacity: Fair exercise capacity. BP Response:  Normal blood pressure response. Clinical Symptoms: Short of breath, fatigued.  ECG Impression: Insignificant upsloping ST segment depression. Comparison with Prior Nuclear Study: No significant change from previous study  Overall Impression: Low risk stress nuclear study with a medium-sized, mild fixed basal to apical inferior perfusion defect. Given normal wall motion, This may be diaphragmatic attenuation. No evidence for ischemia. .  LV Ejection Fraction: 56%. LV Wall Motion: NL LV  Function; NL Wall Motion  ASSESSMENT AND PLAN: 1.  CAD, native vessel, with atypical angina: Patient with history of LAD stenting. It is reassuring that he can walk on the treadmill without symptoms of angina. However, some of his symptoms, especially upper back pain, remind him of his cardiac pain in the past. I think it is best to do an exercise Myoview stress test for further evaluation. His last study from 2014 is reviewed and can be used for comparison. He will continue on his current medical program.  2. Essential hypertension: The patient has systolic hypertension. Considering his gait unsteadiness, it may be best to continue his current medicines without increasing antihypertensives at this time. I did review his pressures over time.  3. Hyperlipidemia: The patient is treated with Crestor 5 mg 3 days weekly. His last LDL was 78 mg/dL.  Current medicines are reviewed with the patient today.  The patient does not have concerns regarding medicines.  Labs/ tests ordered today include:  No orders of the defined types were placed in this encounter.    Disposition:   FU one year unless stress test is abnormal  Signed, Sherren Mocha, MD  03/27/2016 8:59 AM    [facility] [place], [place], [place]  [address] Phone: [phone]; Fax: [phone]

## 2016-04-01 ENCOUNTER — Other Ambulatory Visit: Payer: Medicare Other

## 2016-04-01 ENCOUNTER — Ambulatory Visit (INDEPENDENT_AMBULATORY_CARE_PROVIDER_SITE_OTHER): Payer: Medicare Other | Admitting: Neurology

## 2016-04-01 ENCOUNTER — Encounter: Payer: Self-pay | Admitting: Neurology

## 2016-04-01 ENCOUNTER — Ambulatory Visit: Payer: Medicare Other | Admitting: Neurology

## 2016-04-01 VITALS — BP 130/68 | HR 65 | Ht 66.0 in | Wt 145.0 lb

## 2016-04-01 DIAGNOSIS — R2681 Unsteadiness on feet: Secondary | ICD-10-CM

## 2016-04-01 DIAGNOSIS — E538 Deficiency of other specified B group vitamins: Secondary | ICD-10-CM

## 2016-04-01 DIAGNOSIS — G609 Hereditary and idiopathic neuropathy, unspecified: Secondary | ICD-10-CM | POA: Diagnosis not present

## 2016-04-01 DIAGNOSIS — R251 Tremor, unspecified: Secondary | ICD-10-CM | POA: Diagnosis not present

## 2016-04-01 DIAGNOSIS — I25119 Atherosclerotic heart disease of native coronary artery with unspecified angina pectoris: Secondary | ICD-10-CM

## 2016-04-01 LAB — VITAMIN B12: Vitamin B-12: 399 pg/mL (ref 200–1100)

## 2016-04-01 NOTE — Patient Instructions (Signed)
1. You have been referred to Neuro Rehab. They will call you directly to schedule an appointment.  Please call [phone] if you do not hear from them.   2. Your provider has requested that you have labwork completed today. Please go to [facility] ([id number]) on the second floor of this building before leaving the office today. You do not need to check in. If you are not called within 15 minutes please check with the front desk.

## 2016-04-01 NOTE — Progress Notes (Signed)
[name] [name] was seen today in the movement disorders clinic for neurologic consultation at the request of Binnie Rail, MD.  The consultation is for the evaluation of gait instability for approximately 2-3 years and tremor in the right hand.  Pt states that he didn't even notice the tremor but Dr. [name] pointed it out to him.  States that balance is off especially when on uneven ground.  He feels that he is "wobbling."  He has not had falls but has had near falls.  Some paresthesias in the feet at night when wakes up but otherwise no.  No DM.  Did stop his crestor because thought that it was causing myalgia.  They just restarted but he is only taking 2 tablets a week.  He had increased LFT's on zocor The records that were made available to me were reviewed.  04/01/16 update:  The patient is following up today.  I last saw him in November.  At that point in time, I did think that some of his gait changes were from peripheral neuropathy.  I did lab work to rule out reversible causes and his B12 was low at 259.  I asked him to start taking an oral supplement and he states that he does that but forgets about 1/2 the time.  He also has a history of a parkinsonian tremor, without meeting the [place] brain bank criteria for Parkinson's disease.  That is primary reason I am following him up in 6 month intervals.   He states that he never has noted it on the L really and it doesn't bother him.  He has noted some tremor in the right leg.  He states that he did much better physically after going through PT.   I did review records since our last visit.  He recently saw cardiology on 03/27/2016.  He had atypical angina.  He is scheduled for an exercise stress Myoview.  PREVIOUS MEDICATIONS: none to date  ALLERGIES:   Allergies  Allergen Reactions  . Hydromorphone Hcl      Dilaudid Rxed for renal calculi caused angioedema   . Phenytoin Swelling  . Zocor [Simvastatin - High Dose]     Elevated hepatic enzymes    . Crestor [Rosuvastatin Calcium] Other (See Comments)    Leg pain. Not as bad since on a lower dose  . Hydromorphone Hcl      Dilaudid Rxed for renal calculi caused angioedema  . Statins     Leg pain    CURRENT MEDICATIONS:  Outpatient Encounter Prescriptions as of 04/01/2016  Medication Sig  . amLODipine (NORVASC) 5 MG tablet TAKE 1 TABLET BY MOUTH EVERY DAY  . aspirin 81 MG tablet Take 81 mg by mouth daily.    . clopidogrel (PLAVIX) 75 MG tablet Take 1 tablet (75 mg total) by mouth daily.  . fexofenadine (ALLEGRA) 180 MG tablet Take 180 mg by mouth daily as needed for allergies or rhinitis.   . fluticasone (FLONASE) 50 MCG/ACT nasal spray PLACE TWO SPRAYS INTO THE NOSE AT BEDTIME Must keep 01/22/16 appt for future refills  . Fluticasone-Salmeterol (ADVAIR) 100-50 MCG/DOSE AEPB Inhale 1 puff into the lungs 2 (two) times daily as needed (sob or wheezing).  . MYRBETRIQ 50 MG TB24 tablet Take 50 mg by mouth daily.   . nitroGLYCERIN (NITROSTAT) 0.4 MG SL tablet Place 1 tablet (0.4 mg total) under the tongue every 5 (five) minutes as needed for chest pain.  . rosuvastatin (CRESTOR) 5 MG  tablet Take 1 tablet by mouth daily on Monday, Wednesday and Friday  . sildenafil (VIAGRA) 100 MG tablet Take 50 mg by mouth daily as needed for erectile dysfunction.  . [DISCONTINUED] ranitidine (ZANTAC) 150 MG tablet TAKE 1 TABLET(150 MG) BY MOUTH TWICE DAILY  . [DISCONTINUED] sucralfate (CARAFATE) 1 g tablet Take 1 g by mouth 4 (four) times daily -  with meals and at bedtime.   No facility-administered encounter medications on file as of 04/01/2016.    PAST MEDICAL HISTORY:   Past Medical History  Diagnosis Date  . CAD (coronary artery disease)     a. s/p Cypher DES x 2 to prox LAD and Cypher DES x 1 to distal LAD 08/2009;  b. cath with residual D2 50% 10/05 and normal EF;  c. Myoview 6/11: EF 60%, mild fixed INF, inferoseptal, inferoapical thinning, no ischemia, low risk, EF 60%  . HLD (hyperlipidemia)      h/o elevated LFTs on Zocor  . GERD (gastroesophageal reflux disease)   . Asthma     post infectious  . Skin cancer, basal cell     Dr Sherrye Payor  . Nephrolithiasis   . Colon polyps   . Bladder cancer [facility])     Dr Estill Dooms, [facility]  . Other abnormal glucose 2012    FBS 104    PAST SURGICAL HISTORY:   Past Surgical History  Procedure Laterality Date  . Coronary artery stenting  2005    Dr. Olevia Perches, 3 vessel  . Lithotripsy  2002  . Colonoscopy with polypectomy  2005    Dr. Verl Blalock  . Upper gastrointestinal endoscopy  2005    Esophageal reflux, hiatal hernia  . Bladder tumor excision  2011, 2012, 2013    Dr Estill Dooms, [facility]  . Cataract extraction  2003    Bilateral, [facility] Ophth    SOCIAL HISTORY:   Social History   Social History  . Marital Status: Married    Spouse Name: N/A  . Number of Children: N/A  . Years of Education: N/A   Occupational History  . Not on file.   Social History Main Topics  . Smoking status: Former Smoker    Types: Cigarettes    Quit date: 11/17/1960  . Smokeless tobacco: Not on file     Comment: smoked 1948-1962, up to 1/2 ppd  . Alcohol Use: 6.0 oz/week    10 Glasses of wine per week     Comment: wine @ dinner  . Drug Use: No  . Sexual Activity: Not on file   Other Topics Concern  . Not on file   Social History Narrative    FAMILY HISTORY:   Family Status  Relation Status Death Age  . Mother Deceased 7    stroke  . Father Deceased     heart disease  . Paternal Grandmother Deceased   . Brother Deceased     ETOH, depression  . Brother Alive     healthy  . Brother Alive     healthy  . Sister Alive     healthy  . Child Alive     2, alive and well    ROS:  Denies neck or back pain.  No lateralizing weakness or paresthesias.  A complete 10 system review of systems was obtained and was unremarkable apart from what is mentioned above.  PHYSICAL EXAMINATION:    VITALS:   Filed Vitals:   04/01/16 0857  BP:  130/68  Pulse: 65  Height: 5'  6" (1.676 m)  Weight: 145 lb (65.772 kg)    GEN:  The patient appears stated age and is in NAD. HEENT:  Normocephalic, atraumatic.  The mucous membranes are moist. The superficial temporal arteries are without ropiness or tenderness. CV:  RRR Lungs:  CTAB Neck/HEME:  There are no carotid bruits bilaterally.  Neurological examination:  Orientation: The patient is alert and oriented x3. Fund of knowledge is appropriate.  Recent and remote memory are intact.  Attention and concentration are normal.    Able to name objects and repeat phrases. Cranial nerves: There is good facial symmetry.  The speech is fluent and clear. Soft palate rises symmetrically and there is no tongue deviation. Hearing is intact to conversational tone. Sensation: Sensation is intact to light intact.   Motor: Strength is 5/5 in the bilateral upper and lower extremities.   Shoulder shrug is equal and symmetric.  There is no pronator drift. Deep tendon reflexes: Deep tendon reflexes are 3/4 at the bilateral biceps, triceps, brachioradialis, patella and 2/4 at the bilateral achilles. Plantar responses are downgoing bilaterally.  Movement examination: Tone: There is no rigidity today Abnormal movements: There is an irregular resting tremor in the RUE that can only be felt today and not seen Coordination:  There is no decremation with RAM's, with any form of RAMS, including alternating supination and pronation of the forearm, hand opening and closing, finger taps, heel taps and toe taps. Gait and Station: The patient has no difficulty arising out of a deep-seated chair without the use of the hands. The patient's stride length is mildly decreased but no re-emergent tremor today.    Lab Results  Component Value Date   VITAMINB12 259 09/20/2015    Lab Results  Component Value Date   TSH 3.75 09/18/2015   Lab Results  Component Value Date   HGBA1C 4.7 09/18/2015     Chemistry       Component Value Date/Time   NA 142 09/18/2015 1556   K 3.5 09/18/2015 1556   CL 107 09/18/2015 1556   CO2 28 09/18/2015 1556   BUN 18 09/18/2015 1556   CREATININE 1.01 09/18/2015 1556      Component Value Date/Time   CALCIUM 9.1 09/18/2015 1556   ALKPHOS 116 07/30/2015 0737   AST 20 07/30/2015 0737   ALT 20 07/30/2015 0737   BILITOT 0.8 07/30/2015 0737       ASSESSMENT/PLAN:  1.  Gait instability, in part due to PN  -labs for reversible causes of PN negative, except mild B12 deficiency  -wants to resume PT as found it very helpful and will send an order to neurorehab center 2.  Parkinsonian tremor  -While the patient does have a parkinsonian tremor on the right, he does not meet formal criteria for the diagnosis of idiopathic Parkinson's disease.  This certainly does not mean this won't develop in the future and I would like to see him every 6 months just to make sure. 3.  B12 deficiency  -started oral B12 supplements but admits not very faithful with it  -will recheck level 4.  F/u 6 months, sooner should new issues arise.  Much greater than 50% of this visit was spent in counseling and coordinating care.  Total face to face time:  25 min

## 2016-04-02 ENCOUNTER — Telehealth: Payer: Self-pay | Admitting: Neurology

## 2016-04-02 NOTE — Telephone Encounter (Signed)
Tried to call patient and he was not available. Mychart message sent.

## 2016-04-02 NOTE — Telephone Encounter (Signed)
-----   Message from Steele, DO sent at 04/02/2016  7:35 AM EDT ----- You can let pt know that B12 is better than it was, just needs to make sure that he is faithful with taking.

## 2016-04-03 ENCOUNTER — Telehealth (HOSPITAL_COMMUNITY): Payer: Self-pay | Admitting: *Deleted

## 2016-04-03 NOTE — Telephone Encounter (Signed)
Left message on voicemail per DPR in reference to upcoming appointment scheduled on 04/08/16 at [facility] with detailed instructions given per Myocardial Perfusion Study Information Sheet for the test. LM to arrive 15 minutes early, and that it is imperative to arrive on time for appointment to keep from having the test rescheduled. If you need to cancel or reschedule your appointment, please call the office within 24 hours of your appointment. Failure to do so may result in a cancellation of your appointment, and a $50 no show fee. Phone number given for call back for any questions. Kailey Esquilin, Ranae [place]

## 2016-04-08 ENCOUNTER — Ambulatory Visit (HOSPITAL_COMMUNITY): Payer: Medicare Other | Attending: Cardiology

## 2016-04-08 DIAGNOSIS — J45909 Unspecified asthma, uncomplicated: Secondary | ICD-10-CM | POA: Diagnosis not present

## 2016-04-08 DIAGNOSIS — I10 Essential (primary) hypertension: Secondary | ICD-10-CM | POA: Diagnosis not present

## 2016-04-08 DIAGNOSIS — I25119 Atherosclerotic heart disease of native coronary artery with unspecified angina pectoris: Secondary | ICD-10-CM | POA: Insufficient documentation

## 2016-04-08 LAB — MYOCARDIAL PERFUSION IMAGING
CHL CUP MPHR: 135 {beats}/min
CHL CUP RESTING HR STRESS: 60 {beats}/min
CSEPED: 7 min
CSEPEDS: 45 s
CSEPEW: 9.4 METS
LV sys vol: 37 mL
LVDIAVOL: 97 mL (ref 62–150)
NUC STRESS TID: 0.86
Peak HR: 117 {beats}/min
Percent HR: 86 %
RATE: 0.41
SDS: 1
SRS: 6
SSS: 7

## 2016-04-08 MED ORDER — TECHNETIUM TC 99M TETROFOSMIN IV KIT
11.0000 | PACK | Freq: Once | INTRAVENOUS | Status: AC | PRN
  Administered 2016-04-08: 11 via INTRAVENOUS
  Filled 2016-04-08: qty 11

## 2016-04-08 MED ORDER — TECHNETIUM TC 99M TETROFOSMIN IV KIT
32.7000 | PACK | Freq: Once | INTRAVENOUS | Status: AC | PRN
Start: 2016-04-08 — End: 2016-04-08
  Administered 2016-04-08: 32.7 via INTRAVENOUS
  Filled 2016-04-08: qty 33

## 2016-04-21 DIAGNOSIS — M7061 Trochanteric bursitis, right hip: Secondary | ICD-10-CM | POA: Diagnosis not present

## 2016-04-22 ENCOUNTER — Ambulatory Visit: Payer: Medicare Other | Admitting: Physical Therapy

## 2016-04-23 ENCOUNTER — Ambulatory Visit: Payer: Medicare Other | Admitting: Physical Therapy

## 2016-04-29 ENCOUNTER — Ambulatory Visit: Payer: Medicare Other | Admitting: Physical Therapy

## 2016-05-14 ENCOUNTER — Ambulatory Visit: Payer: Medicare Other | Attending: Orthopaedic Surgery | Admitting: Physical Therapy

## 2016-05-14 DIAGNOSIS — M25651 Stiffness of right hip, not elsewhere classified: Secondary | ICD-10-CM | POA: Insufficient documentation

## 2016-05-14 DIAGNOSIS — R293 Abnormal posture: Secondary | ICD-10-CM

## 2016-05-14 DIAGNOSIS — M6281 Muscle weakness (generalized): Secondary | ICD-10-CM | POA: Insufficient documentation

## 2016-05-14 DIAGNOSIS — M25551 Pain in right hip: Secondary | ICD-10-CM | POA: Diagnosis not present

## 2016-05-14 DIAGNOSIS — R2689 Other abnormalities of gait and mobility: Secondary | ICD-10-CM

## 2016-05-14 NOTE — Patient Instructions (Signed)
Lumbar Rotation (Non-Weight Bearing)    Feet on floor, slowly rock knees from side to side in small, pain-free range of motion. Allow lower back to rotate slightly. Repeat __10__ times per set. Do __1-2__ sets per session. Do ___2_ sessions per day.  http://orth.exer.us/160   Copyright  VHI. All rights reserved.    HIP: Hamstrings - Short Sitting    Rest leg on raised surface. Keep knee straight. Lift chest. Hold _30__ seconds. _2-3__ reps per set, _1-2__ sets per day, __ALL_ days per week  Copyright  VHI. All rights reserved.  Piriformis Stretch    Lying on back, pull right knee toward opposite shoulder. Hold 30 ____ seconds. Repeat _2-3___ times. Do 2____ sessions per day.  http://gt2.exer.us/257   Copyright  VHI. All rights reserved.  Knee to Chest    Lying supine, bend involved knee to chest __2-3_ times. Repeat with other leg. Do ___2 times per day.  Copyright  VHI. All rights reserved.

## 2016-05-14 NOTE — Therapy (Signed)
[place], [place], [address] Phone: [phone]   Fax:  [phone]  Physical Therapy Evaluation  Patient Details  Name: [name] [name] MRN: [id number] Date of Birth: [birth year] Referring Provider: Dr Jean Rosenthal  Encounter Date: 05/14/2016      PT End of Session - 05/14/16 1344    Visit Number 1   Number of Visits 16   Date for PT Re-Evaluation 07/09/16   PT Start Time 0930   PT Stop Time 1015   PT Time Calculation (min) 45 min   Activity Tolerance Patient tolerated treatment well   Behavior During Therapy [facility] for tasks assessed/performed      Past Medical History  Diagnosis Date  . CAD (coronary artery disease)     a. s/p Cypher DES x 2 to prox LAD and Cypher DES x 1 to distal LAD 08/2009;  b. cath with residual D2 50% 10/05 and normal EF;  c. Myoview 6/11: EF 60%, mild fixed INF, inferoseptal, inferoapical thinning, no ischemia, low risk, EF 60%  . HLD (hyperlipidemia)     h/o elevated LFTs on Zocor  . GERD (gastroesophageal reflux disease)   . Asthma     post infectious  . Skin cancer, basal cell     Dr Sherrye Payor  . Nephrolithiasis   . Colon polyps   . Bladder cancer [facility])     Dr Estill Dooms, [facility]  . Other abnormal glucose 2012    FBS 104    Past Surgical History  Procedure Laterality Date  . Coronary artery stenting  2005    Dr. Olevia Perches, 3 vessel  . Lithotripsy  2002  . Colonoscopy with polypectomy  2005    Dr. Verl Blalock  . Upper gastrointestinal endoscopy  2005    Esophageal reflux, hiatal hernia  . Bladder tumor excision  2011, 2012, 2013    Dr Estill Dooms, [facility]  . Cataract extraction  2003    Bilateral, [facility]    There were no vitals filed for this visit.       Subjective Assessment - 05/14/16 0934    Subjective Pt reports onset of pain approx. 3-4 week ago. Pain has been shorter lasting and more intermittent over the past year. He had a cortisone  injection, pain increased but now feels some relief.  Patient has pain with walking up steps at times, lying on Rt. side at night.     Pertinent History bladder CA, Cardiac Stents   Limitations Standing;Walking;House hold activities;Other (comment)   How long can you sit comfortably? 30 min    How long can you stand comfortably? better than walking , about 60 min pain was minimal this past weekend   How long can you walk comfortably? 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 Yes   Pain Score 1    Pain Location Hip   Pain Orientation Right   Pain Descriptors / Indicators Sore   Pain Type Chronic pain   Pain Onset More than a month ago   Pain Frequency Intermittent   Aggravating Factors  not sure, feels with certain transitional movements, walking up hill and overactivity   Pain Relieving Factors ice, rest, sitting   Effect of Pain on Daily Activities patient not as active as he  wants to be, but understands he is aging            [facility] PT Assessment - 05/14/16 0944    Assessment   Medical Diagnosis Rt. hip   Referring Provider Dr Jean Rosenthal   Onset Date/Surgical Date --  chronic    Next MD Visit Unknown   Prior Therapy Yes    Precautions   Precautions None   Restrictions   Weight Bearing Restrictions No   Balance Screen   Has the patient fallen in the past 6 months No   Has the patient had a decrease in activity level because of a fear of falling?  Yes   Is the patient reluctant to leave their home because of a fear of falling?  No   Home Environment   Living Environment Private residence   Living Arrangements Spouse/significant other   Type of [place] to enter   Home Layout Two level   Prior Function   Level of [place]   Overall Cognitive Status Within  Functional Limits for tasks assessed   Observation/Other Assessments   Focus on Therapeutic Outcomes (FOTO)  NT for this episode    Sensation   Light Touch Appears Intact   Posture/Postural Control   Posture/Postural Control Postural limitations   Postural Limitations Rounded Shoulders;Forward head;Left pelvic obliquity;Flexed trunk   Posture Comments shoulders shifted Rt. hips to L    AROM   Lumbar Flexion mid thigh    Lumbar Extension 30 deg   Lumbar - Right Side Bend 75-80% limited   Lumbar - Left Side Bend 50% pain on Rt.    Lumbar - Right Rotation pain on Rt., 50%    Lumbar - Left Rotation 25% no pain    PROM   Right Hip External Rotation  45   Right Hip Internal Rotation  20   Left Hip External Rotation  50   Left Hip Internal Rotation  30   Strength   Right Hip Flexion 5/5   Right Hip Extension 3+/5   Right Hip ABduction 3+/5   Left Hip Flexion 5/5   Left Hip Extension 4/5   Left Hip ABduction 4/5   Right Knee Flexion 5/5   Right Knee Extension 5/5   Left Knee Flexion 5/5   Left Knee Extension 5/5   Right Ankle Dorsiflexion 4/5   Left Ankle Dorsiflexion 4/5   Flexibility   Hamstrings 40-45 deg   Quadriceps tight   ITB tight   Piriformis tight   Palpation   Palpation comment Pain at Rt. superior gluteals and across to Rt. iliac crest, no pain at Gr. Trochanter             PT Education - 05/14/16 1343    Education provided Yes   Education Details PT/POC, HEP, posture and referred pain   Person(s) Educated Patient   Methods Explanation;Demonstration;Verbal cues;Handout   Comprehension Verbalized understanding;Need further instruction          PT Short Term Goals - 05/14/16 1352    PT SHORT TERM GOAL #1   Title Pt will be I with initial HEP    Time 4   Period Weeks   Status New   PT SHORT TERM GOAL #2   Title Pt will complete Balance Screen and set goal, understand results.    Time 4   Period Weeks   Status New   PT SHORT TERM GOAL #3   Title  Pt will be able to lie on Rt side for sleep and report only min pain at times.    Time 4   Period Weeks   Status New           PT Long Term Goals - 27-May-2016 1353    PT LONG TERM GOAL #1   Title Pt will understand body mechanics and posture as it relates to daily functional mobility.    Time 8   Period Weeks   Status New   PT LONG TERM GOAL #2   Title Berg Balance goal to be assessed.    Time 8   Period Weeks   Status New   PT LONG TERM GOAL #3   Title Pt will be able to walk up to 2 miles and report only min pain in Rt. hip   Time 8   Period Weeks   Status New   PT LONG TERM GOAL #4   Title Pt will be able to be I with more advanced HEP    Time 8   Period Weeks   Status New               Plan - May 27, 2016 1345    Clinical Impression Statement Patient with mod complexity evaluation of Rt. sided hip pain which has been intermittent and chronic for about a year.  Pain has been more consistent now, although today was the best he has felt in a few weeks.  He does have decreased flexibility in spine, hips and postural imbalance.  Pain was located more superiorly that I had expected, question if Lumbar spine is involved.  It apeared he was also given a referral for Neuro Rehab to address gait instability, but perhaps he can address that in addition to his pain and musculoskeletal  issues.     Rehab Potential Excellent   PT Frequency 2x / week   PT Duration 8 weeks   PT Treatment/Interventions Neuromuscular re-education;Manual techniques;Therapeutic exercise;Moist Heat;Therapeutic activities;Electrical Stimulation;Cryotherapy;Gait training;Patient/family education;Passive range of motion;Ultrasound   PT Next Visit Plan review HEP , add core and any modalities that may be beneficial (IFC, [place]), balance screen    PT Home Exercise Plan LTR, knee to chest, knee to opp shoulder, seated hamstring    Consulted and Agree with Plan of Care Patient      Patient will benefit from  skilled therapeutic intervention in order to improve the following deficits and impairments:  Decreased range of motion, Difficulty walking, Increased fascial restricitons, Pain, Impaired flexibility, Hypomobility, Decreased balance, Decreased mobility, Decreased strength, Postural dysfunction  Visit Diagnosis: Other abnormalities of gait and mobility  Pain in right hip  Stiffness of right hip, not elsewhere classified  Abnormal posture  Muscle weakness (generalized)      G-Codes - May 27, 2016 1357    Functional Assessment Tool Used clinical judgement    Functional Limitation Mobility: Walking and moving around   Mobility: Walking and Moving Around Current Status [phone]) At least 40 percent but less than 60 percent impaired, limited or restricted   Mobility: Walking and Moving Around Goal Status [phone]) At least 20 percent but less than 40 percent impaired, limited or restricted       Problem List Patient Active Problem List   Diagnosis Date Noted  . Essential hypertension, benign 06/16/2012  . CAD (coronary artery disease) 05/27/2011  . Bladder cancer ([place]) 05/27/2011  . Osteopenia 05/27/2011  . SKIN CANCER, HX OF 02/28/2009  . COLONIC POLYPS, HX OF 02/28/2009  .  ELEVATED PROSTATE SPECIFIC ANTIGEN 03/28/2008  . Hyperlipidemia 01/31/2008  . HYPERPLASIA PROSTATE UNS W/O UR OBST & OTH LUTS 01/31/2008  . IMPOTENCE OF ORGANIC ORIGIN 01/31/2008  . Asthma 09/28/2007  . GERD 07/22/2007  . NEPHROLITHIASIS, HX OF 07/22/2007    Allesha Aronoff 05/14/2016, 1:59 PM  [facility] [address] [place], [place], [address] Phone: [phone]   Fax:  [phone]  Name: MY SIDEL MRN: [id number] Date of Birth: June 21, 1929   Raeford Razor, PT 05/14/2016 1:59 PM Phone: [phone] Fax: [phone]

## 2016-05-27 ENCOUNTER — Ambulatory Visit: Payer: Medicare Other | Attending: Orthopaedic Surgery | Admitting: Physical Therapy

## 2016-05-27 DIAGNOSIS — M6281 Muscle weakness (generalized): Secondary | ICD-10-CM | POA: Insufficient documentation

## 2016-05-27 DIAGNOSIS — R293 Abnormal posture: Secondary | ICD-10-CM | POA: Diagnosis not present

## 2016-05-27 DIAGNOSIS — M25651 Stiffness of right hip, not elsewhere classified: Secondary | ICD-10-CM

## 2016-05-27 DIAGNOSIS — R2689 Other abnormalities of gait and mobility: Secondary | ICD-10-CM | POA: Insufficient documentation

## 2016-05-27 DIAGNOSIS — M25551 Pain in right hip: Secondary | ICD-10-CM | POA: Insufficient documentation

## 2016-05-27 NOTE — Therapy (Signed)
[facility] [address] [place], [place], [address] Phone: [phone]   Fax:  [phone]  Physical Therapy Treatment  Patient Details  Name: [name] [name] MRN: [id number] Date of Birth: 1929/08/01 Referring Provider: Dr Doneen Poisson  Encounter Date: 05/27/2016      PT End of Session - 05/27/16 1827    Visit Number 2   Number of Visits 16   Date for PT Re-Evaluation 07/09/16   PT Start Time 1500   PT Stop Time 1600   PT Time Calculation (min) 60 min   Activity Tolerance Patient tolerated treatment well   Behavior During Therapy [facility] for tasks assessed/performed      Past Medical History  Diagnosis Date  . CAD (coronary artery disease)     a. s/p Cypher DES x 2 to prox LAD and Cypher DES x 1 to distal LAD 08/2009;  b. cath with residual D2 50% 10/05 and normal EF;  c. Myoview 6/11: EF 60%, mild fixed INF, inferoseptal, inferoapical thinning, no ischemia, low risk, EF 60%  . HLD (hyperlipidemia)     h/o elevated LFTs on Zocor  . GERD (gastroesophageal reflux disease)   . Asthma     post infectious  . Skin cancer, basal cell     Dr Londell Moh  . Nephrolithiasis   . Colon polyps   . Bladder cancer [facility])     [facility] Lind[facility]  . Other abnormal glucose 2012    FBS 104    Past Surgical History  Procedure Laterality Date  . Coronary artery stenting  2005    Dr. Juanda Chance, 3 vessel  . Lithotripsy  2002  . Colonoscopy with polypectomy  2005    Dr. Sheryn Bison  . Upper gastrointestinal endoscopy  2005    Esophageal reflux, hiatal hernia  . Bladder tumor excision  2011, 2012, 2013    Dr Lindley Magnus, [facility]  . Cataract extraction  2003    Bilateral, [facility]    There were no vitals filed for this visit.      Subjective Assessment - 05/27/16 1516    Subjective Pain in middle back in waiting area, 2/10. Just really stiff.  [place].. No hip pain. Middle back bothered him with  walking.    Currently in Pain? Yes   Pain Score 2    Pain Location Back   Pain Orientation Upper;Mid  goes away with sitting    Pain Descriptors / Indicators Tightness   Pain Type Chronic pain   Pain Onset More than a month ago   Pain Frequency Intermittent   Aggravating Factors  sitting without back support, walking and carrying items.              [facility] Adult PT Treatment/Exercise - 05/27/16 1524    Lumbar Exercises: Stretches   Active Hamstring Stretch 3 reps;30 seconds   Active Hamstring Stretch Limitations done in sitting x 2 for HEP    Single Knee to Chest Stretch 3 reps;30 seconds   Lower Trunk Rotation Limitations x 10    Piriformis Stretch 3 reps;30 seconds   Lumbar Exercises: Aerobic   Stationary Bike NuStep L5 UE and LE for 6 min    Shoulder Exercises: Stretch   Corner Stretch 3 reps;30 seconds   Other Shoulder Stretches overhead with cane in supine for stretching   Modalities   Modalities Moist Heat   Moist Heat Therapy   Number Minutes Moist Heat 15  Minutes   Moist Heat Location Lumbar Spine                PT Education - 05/27/16 1827    Education provided Yes   Education Details HEP, anatomy, stretching and heat for relaxation of mm   Person(s) Educated Patient   Methods Explanation;Demonstration   Comprehension Verbalized understanding;Returned demonstration;Need further instruction          PT Short Term Goals - 05/27/16 1519    PT SHORT TERM GOAL #1   Title Pt will be I with initial HEP    Status On-going   PT SHORT TERM GOAL #2   Title Pt will complete Balance Screen and set goal, understand results.    Status On-going   PT SHORT TERM GOAL #3   Title Pt will be able to lie on Rt side for sleep and report only min pain at times.    Status On-going           PT Long Term Goals - 05/14/16 1353    PT LONG TERM GOAL #1   Title Pt will understand body mechanics and posture as it relates to daily functional mobility.    Time 8    Period Weeks   Status New   PT LONG TERM GOAL #2   Title Berg Balance goal to be assessed.    Time 8   Period Weeks   Status New   PT LONG TERM GOAL #3   Title Pt will be able to walk up to 2 miles and report only min pain in Rt. hip   Time 8   Period Weeks   Status New   PT LONG TERM GOAL #4   Title Pt will be able to be I with more advanced HEP    Time 8   Period Weeks   Status New               Plan - 05/27/16 1548    Clinical Impression Statement 2nd visit, no goals met. Patient can do HEP with min A, hasnot been stretching hamstrings, though. Pain and tension in upper middle back was resolved after session.     PT Next Visit Plan berg, cont stretching and postural ex   PT Home Exercise Plan LTR, knee to chest, knee to opp shoulder, seated hamstring    Consulted and Agree with Plan of Care Patient      Patient will benefit from skilled therapeutic intervention in order to improve the following deficits and impairments:  Decreased range of motion, Difficulty walking, Increased fascial restricitons, Pain, Impaired flexibility, Hypomobility, Decreased balance, Decreased mobility, Decreased strength, Postural dysfunction  Visit Diagnosis: Other abnormalities of gait and mobility  Pain in right hip  Stiffness of right hip, not elsewhere classified  Abnormal posture  Muscle weakness (generalized)     Problem List Patient Active Problem List   Diagnosis Date Noted  . Essential hypertension, benign 06/16/2012  . CAD (coronary artery disease) 05/27/2011  . Bladder cancer ([place]) 05/27/2011  . Osteopenia 05/27/2011  . SKIN CANCER, HX OF 02/28/2009  . COLONIC POLYPS, HX OF 02/28/2009  . ELEVATED PROSTATE SPECIFIC ANTIGEN 03/28/2008  . Hyperlipidemia 01/31/2008  . HYPERPLASIA PROSTATE UNS W/O UR OBST & OTH LUTS 01/31/2008  . IMPOTENCE OF ORGANIC ORIGIN 01/31/2008  . Asthma 09/28/2007  . GERD 07/22/2007  . NEPHROLITHIASIS, HX OF 07/22/2007     PAA,JENNIFER 05/27/2016, 6:51 PM  [place]  [place], [place], [address] Phone: [phone]   Fax:  [phone]  Name: [name] [name] MRN: [id number] Date of Birth: 1929-06-27    Raeford Razor, PT 05/27/2016 6:51 PM Phone: [phone] Fax: [phone]

## 2016-05-30 ENCOUNTER — Ambulatory Visit: Payer: Medicare Other | Admitting: Physical Therapy

## 2016-05-30 DIAGNOSIS — R2689 Other abnormalities of gait and mobility: Secondary | ICD-10-CM | POA: Diagnosis not present

## 2016-05-30 DIAGNOSIS — M25551 Pain in right hip: Secondary | ICD-10-CM | POA: Diagnosis not present

## 2016-05-30 DIAGNOSIS — M6281 Muscle weakness (generalized): Secondary | ICD-10-CM | POA: Diagnosis not present

## 2016-05-30 DIAGNOSIS — M25651 Stiffness of right hip, not elsewhere classified: Secondary | ICD-10-CM | POA: Diagnosis not present

## 2016-05-30 DIAGNOSIS — R293 Abnormal posture: Secondary | ICD-10-CM | POA: Diagnosis not present

## 2016-05-30 NOTE — Therapy (Signed)
[place], [place], [address] Phone: [phone]   Fax:  [phone]  Physical Therapy Treatment  Patient Details  Name: [name] [name] MRN: [id number] Date of Birth: May 26, 1929 Referring Provider: Dr Jean Rosenthal  Encounter Date: 05/30/2016      PT End of Session - 05/30/16 1101    Visit Number 3   Number of Visits 16   Date for PT Re-Evaluation 07/09/16   PT Start Time 1019   PT Stop Time 1110   PT Time Calculation (min) 51 min   Activity Tolerance Patient tolerated treatment well   Behavior During Therapy [facility] for tasks assessed/performed      Past Medical History  Diagnosis Date  . CAD (coronary artery disease)     a. s/p Cypher DES x 2 to prox LAD and Cypher DES x 1 to distal LAD 08/2009;  b. cath with residual D2 50% 10/05 and normal EF;  c. Myoview 6/11: EF 60%, mild fixed INF, inferoseptal, inferoapical thinning, no ischemia, low risk, EF 60%  . HLD (hyperlipidemia)     h/o elevated LFTs on Zocor  . GERD (gastroesophageal reflux disease)   . Asthma     post infectious  . Skin cancer, basal cell     Dr Sherrye Payor  . Nephrolithiasis   . Colon polyps   . Bladder cancer [facility])     Dr Estill Dooms, [facility]  . Other abnormal glucose 2012    FBS 104    Past Surgical History  Procedure Laterality Date  . Coronary artery stenting  2005    Dr. Olevia Perches, 3 vessel  . Lithotripsy  2002  . Colonoscopy with polypectomy  2005    Dr. Verl Blalock  . Upper gastrointestinal endoscopy  2005    Esophageal reflux, hiatal hernia  . Bladder tumor excision  2011, 2012, 2013    Dr Estill Dooms, [facility]  . Cataract extraction  2003    Bilateral, [facility]    There were no vitals filed for this visit.      Subjective Assessment - 05/30/16 1022    Subjective Pain in Rt. hip today, 5/10.  It started yesterday, is worse today.     Currently in Pain? Yes   Pain Score 5    Pain Location Hip   Pain  Orientation Right   Pain Descriptors / Indicators Sore;Aching   Pain Type Chronic pain   Pain Onset More than a month ago              [facility] Adult PT Treatment/Exercise - 05/30/16 1041    Lumbar Exercises: Aerobic   Stationary Bike NuStep L5 UE and LE for 6 min    Lumbar Exercises: Supine   Bent Knee Raise 10 reps   Large Ball Abdominal Isometric 10 reps;5 seconds   Other Supine Lumbar Exercises Sidelying QL stretch    Other Supine Lumbar Exercises LLE decompression- leg lengthener x 5    Moist Heat Therapy   Number Minutes Moist Heat 10 Minutes   Moist Heat Location Lumbar Spine   Manual Therapy   Manual therapy comments long axis Rt. LE distraction    Soft tissue mobilization Rt. QL and lumbar    Myofascial Release Rt. trunk                   PT Short Term Goals - 05/27/16 1519    PT SHORT TERM GOAL #1   Title Pt will be I  with initial HEP    Status On-going   PT SHORT TERM GOAL #2   Title Pt will complete Balance Screen and set goal, understand results.    Status On-going   PT SHORT TERM GOAL #3   Title Pt will be able to lie on Rt side for sleep and report only min pain at times.    Status On-going           PT Long Term Goals - 05/14/16 1353    PT LONG TERM GOAL #1   Title Pt will understand body mechanics and posture as it relates to daily functional mobility.    Time 8   Period Weeks   Status New   PT LONG TERM GOAL #2   Title Berg Balance goal to be assessed.    Time 8   Period Weeks   Status New   PT LONG TERM GOAL #3   Title Pt will be able to walk up to 2 miles and report only min pain in Rt. hip   Time 8   Period Weeks   Status New   PT LONG TERM GOAL #4   Title Pt will be able to be I with more advanced HEP    Time 8   Period Weeks   Status New               Plan - 05/30/16 1252    Clinical Impression Statement Pain in Rt. hip today which seems relate to the amount of sitting [place]  He was urged to get a lumbar suport for the car and pay attention to his sitting posture.  Responded well to lengthening of Rt. trunk/hip.    PT Next Visit Plan berg, cont stretching and postural ex   PT Home Exercise Plan LTR, knee to chest, knee to opp shoulder, seated hamstring , QL stretch    Consulted and Agree with Plan of Care Patient      Patient will benefit from skilled therapeutic intervention in order to improve the following deficits and impairments:  Decreased range of motion, Difficulty walking, Increased fascial restricitons, Pain, Impaired flexibility, Hypomobility, Decreased balance, Decreased mobility, Decreased strength, Postural dysfunction  Visit Diagnosis: Other abnormalities of gait and mobility  Pain in right hip  Stiffness of right hip, not elsewhere classified  Abnormal posture  Muscle weakness (generalized)     Problem List Patient Active Problem List   Diagnosis Date Noted  . Essential hypertension, benign 06/16/2012  . CAD (coronary artery disease) 05/27/2011  . Bladder cancer ([place]) 05/27/2011  . Osteopenia 05/27/2011  . SKIN CANCER, HX OF 02/28/2009  . COLONIC POLYPS, HX OF 02/28/2009  . ELEVATED PROSTATE SPECIFIC ANTIGEN 03/28/2008  . Hyperlipidemia 01/31/2008  . HYPERPLASIA PROSTATE UNS W/O UR OBST & OTH LUTS 01/31/2008  . IMPOTENCE OF ORGANIC ORIGIN 01/31/2008  . Asthma 09/28/2007  . GERD 07/22/2007  . NEPHROLITHIASIS, HX OF 07/22/2007    Lasandra Batley 05/30/2016, 12:54 PM  [facility] [address] [place], [place], [address] Phone: [phone]   Fax:  [phone]  Name: [name] [name] MRN: [id number] Date of Birth: 12-14-1928    Raeford Razor, PT 05/30/2016 12:54 PM Phone: [phone] Fax: [phone]

## 2016-06-03 ENCOUNTER — Ambulatory Visit: Payer: Medicare Other | Admitting: Physical Therapy

## 2016-06-03 DIAGNOSIS — R2689 Other abnormalities of gait and mobility: Secondary | ICD-10-CM

## 2016-06-03 DIAGNOSIS — M25551 Pain in right hip: Secondary | ICD-10-CM | POA: Diagnosis not present

## 2016-06-03 DIAGNOSIS — M6281 Muscle weakness (generalized): Secondary | ICD-10-CM

## 2016-06-03 DIAGNOSIS — R293 Abnormal posture: Secondary | ICD-10-CM | POA: Diagnosis not present

## 2016-06-03 DIAGNOSIS — M25651 Stiffness of right hip, not elsewhere classified: Secondary | ICD-10-CM | POA: Diagnosis not present

## 2016-06-03 NOTE — Therapy (Signed)
[place], [place], [address] Phone: [phone]   Fax:  [phone]  Physical Therapy Treatment  Patient Details  Name: BROWNIE WANDLING MRN: [id number] Date of Birth: [birth year] Referring Provider: Dr Jean Rosenthal  Encounter Date: 06/03/2016      PT End of Session - 06/03/16 1312    Visit Number 4   Number of Visits 16   Date for PT Re-Evaluation 07/09/16   PT Start Time 1152   PT Stop Time 1232   PT Time Calculation (min) 40 min   Activity Tolerance Patient tolerated treatment well   Behavior During Therapy [facility] for tasks assessed/performed      Past Medical History  Diagnosis Date  . CAD (coronary artery disease)     a. s/p Cypher DES x 2 to prox LAD and Cypher DES x 1 to distal LAD 08/2009;  b. cath with residual D2 50% 10/05 and normal EF;  c. Myoview 6/11: EF 60%, mild fixed INF, inferoseptal, inferoapical thinning, no ischemia, low risk, EF 60%  . HLD (hyperlipidemia)     h/o elevated LFTs on Zocor  . GERD (gastroesophageal reflux disease)   . Asthma     post infectious  . Skin cancer, basal cell     Dr Sherrye Payor  . Nephrolithiasis   . Colon polyps   . Bladder cancer [facility])     Dr Estill Dooms, [facility]  . Other abnormal glucose 2012    FBS 104    Past Surgical History  Procedure Laterality Date  . Coronary artery stenting  2005    Dr. Olevia Perches, 3 vessel  . Lithotripsy  2002  . Colonoscopy with polypectomy  2005    Dr. Verl Blalock  . Upper gastrointestinal endoscopy  2005    Esophageal reflux, hiatal hernia  . Bladder tumor excision  2011, 2012, 2013    Dr Estill Dooms, [facility]  . Cataract extraction  2003    Bilateral, [facility]    There were no vitals filed for this visit.      Subjective Assessment - 06/03/16 1153    Subjective Rt. hip "crummy" today.  When it hurts its a 5/10.             [facility] PT Assessment - 06/03/16 1156    Berg Balance Test   Sit to Stand  Able to stand without using hands and stabilize independently   Standing Unsupported Able to stand safely 2 minutes   Sitting with Back Unsupported but Feet Supported on Floor or Stool Able to sit safely and securely 2 minutes   Stand to Sit Sits safely with minimal use of hands   Transfers Able to transfer safely, minor use of hands   Standing Unsupported with Eyes Closed Able to stand 10 seconds safely   Standing Ubsupported with Feet Together Able to place feet together independently and stand 1 minute safely   From Standing, Reach Forward with Outstretched Arm Can reach confidently >25 cm (10")   From Standing Position, Pick up Object from Floor Able to pick up shoe safely and easily   From Standing Position, Turn to Look Behind Over each Shoulder Looks behind from both sides and weight shifts well   Turn 360 Degrees Able to turn 360 degrees safely in 4 seconds or less   Standing Unsupported, Alternately Place Feet on Step/Stool Able to stand independently and complete 8 steps >20 seconds   Standing Unsupported, One Foot in Front Able to  plae foot ahead of the other independently and hold 30 seconds   Standing on One Leg Able to lift leg independently and hold equal to or more than 3 seconds   Total Score 52          [facility] Adult PT Treatment/Exercise - 06/03/16 1216    Lumbar Exercises: Sidelying   Clam 10 reps;Other (comment)   Clam Limitations 2 sets   Hip Abduction 20 reps   Lumbar Exercises: Prone   Straight Leg Raise 10 reps   Straight Leg Raises Limitations bends knee    Other Prone Lumbar Exercises PT stretch for ant hip    Moist Heat Therapy   Number Minutes Moist Heat 15 Minutes  during manual traction and stretching   Moist Heat Location Lumbar Spine   Manual Therapy   Manual therapy comments long axis Rt. LE distraction    Soft tissue mobilization --   Myofascial Release Rt. trunk                 PT Education - 06/03/16 1311    Education provided Yes    Education Details new hip HEP   Person(s) Educated Patient   Methods Explanation;Tactile cues;Verbal cues;Demonstration;Handout   Comprehension Verbalized understanding;Returned demonstration          PT Short Term Goals - 05/27/16 1519    PT SHORT TERM GOAL #1   Title Pt will be I with initial HEP    Status On-going   PT SHORT TERM GOAL #2   Title Pt will complete Balance Screen and set goal, understand results.    Status On-going   PT SHORT TERM GOAL #3   Title Pt will be able to lie on Rt side for sleep and report only min pain at times.    Status On-going           PT Long Term Goals - 05/14/16 1353    PT LONG TERM GOAL #1   Title Pt will understand body mechanics and posture as it relates to daily functional mobility.    Time 8   Period Weeks   Status New   PT LONG TERM GOAL #2   Title Berg Balance goal to be assessed.    Time 8   Period Weeks   Status New   PT LONG TERM GOAL #3   Title Pt will be able to walk up to 2 miles and report only min pain in Rt. hip   Time 8   Period Weeks   Status New   PT LONG TERM GOAL #4   Title Pt will be able to be I with more advanced HEP    Time 8   Period Weeks   Status New               Plan - 06/03/16 1313    Clinical Impression Statement Pt felt "more loose" after session.  Session focusd on elongation of Rt. hip/trunk and strengthening hip.  Berg balance score 52/56, no goal warranted.    PT Next Visit Plan Assess goals. stretch, hip strength    PT Home Exercise Plan LTR, knee to chest, knee to opp shoulder, seated hamstring , QL stretch , hip abd and clam    Consulted and Agree with Plan of Care Patient      Patient will benefit from skilled therapeutic intervention in order to improve the following deficits and impairments:  Decreased range of motion, Difficulty walking, Increased fascial restricitons, Pain, Impaired flexibility,  Hypomobility, Decreased balance, Decreased mobility, Decreased strength,  Postural dysfunction  Visit Diagnosis: Other abnormalities of gait and mobility  Pain in right hip  Stiffness of right hip, not elsewhere classified  Abnormal posture  Muscle weakness (generalized)     Problem List Patient Active Problem List   Diagnosis Date Noted  . Essential hypertension, benign 06/16/2012  . CAD (coronary artery disease) 05/27/2011  . Bladder cancer ([place]) 05/27/2011  . Osteopenia 05/27/2011  . SKIN CANCER, HX OF 02/28/2009  . COLONIC POLYPS, HX OF 02/28/2009  . ELEVATED PROSTATE SPECIFIC ANTIGEN 03/28/2008  . Hyperlipidemia 01/31/2008  . HYPERPLASIA PROSTATE UNS W/O UR OBST & OTH LUTS 01/31/2008  . IMPOTENCE OF ORGANIC ORIGIN 01/31/2008  . Asthma 09/28/2007  . GERD 07/22/2007  . NEPHROLITHIASIS, HX OF 07/22/2007    Carle Dargan 06/03/2016, 1:21 PM  [facility] [address] [place], [place], [address] Phone: [phone]   Fax:  [phone]  Name: ELIJUAH YECK MRN: [id number] Date of Birth: September 23, 1929    Raeford Razor, PT 06/03/2016 1:21 PM Phone: [phone] Fax: [phone]

## 2016-06-03 NOTE — Patient Instructions (Signed)
Abduction: Clam (Eccentric) - Side-Lying    Lie on side with knees bent. Lift top knee, keeping feet together. Keep trunk steady. Slowly lower for 3-5 seconds. __10-20_ reps per set, _1 sets per day, _5__ days per week.  http://ecce.exer.us/64   Copyright  VHI. All rights reserved.    Abduction: Side Leg Lift (Eccentric) - Side-Lying    Lie on side. Lift top leg slightly higher than shoulder level. Keep top leg straight with body, toes pointing forward. Slowly lower for 3-5 seconds. _10-20__ reps per set, _1__ sets per day, _5__ days per week.   http://ecce.exer.us/62   Copyright  VHI. All rights reserved.

## 2016-06-05 ENCOUNTER — Ambulatory Visit: Payer: Medicare Other | Admitting: Physical Therapy

## 2016-06-05 DIAGNOSIS — M25551 Pain in right hip: Secondary | ICD-10-CM | POA: Diagnosis not present

## 2016-06-05 DIAGNOSIS — R293 Abnormal posture: Secondary | ICD-10-CM | POA: Diagnosis not present

## 2016-06-05 DIAGNOSIS — M25651 Stiffness of right hip, not elsewhere classified: Secondary | ICD-10-CM

## 2016-06-05 DIAGNOSIS — M6281 Muscle weakness (generalized): Secondary | ICD-10-CM

## 2016-06-05 DIAGNOSIS — R2689 Other abnormalities of gait and mobility: Secondary | ICD-10-CM | POA: Diagnosis not present

## 2016-06-05 NOTE — Therapy (Signed)
[place], [place], [address] Phone: [phone]   Fax:  [phone]  Physical Therapy Treatment  Patient Details  Name: [name] [name] MRN: [id number] Date of Birth: [birth year] Referring Provider: Dr Jean Rosenthal  Encounter Date: 06/05/2016      PT End of Session - 06/05/16 1225    Visit Number 5   Number of Visits 16   Date for PT Re-Evaluation 07/09/16   PT Start Time 1150   PT Stop Time 1230   PT Time Calculation (min) 40 min   Activity Tolerance Patient tolerated treatment well   Behavior During Therapy [facility] for tasks assessed/performed      Past Medical History  Diagnosis Date  . CAD (coronary artery disease)     a. s/p Cypher DES x 2 to prox LAD and Cypher DES x 1 to distal LAD 08/2009;  b. cath with residual D2 50% 10/05 and normal EF;  c. Myoview 6/11: EF 60%, mild fixed INF, inferoseptal, inferoapical thinning, no ischemia, low risk, EF 60%  . HLD (hyperlipidemia)     h/o elevated LFTs on Zocor  . GERD (gastroesophageal reflux disease)   . Asthma     post infectious  . Skin cancer, basal cell     Dr Sherrye Payor  . Nephrolithiasis   . Colon polyps   . Bladder cancer [facility])     Dr Estill Dooms, [place]  . Other abnormal glucose 2012    FBS 104    Past Surgical History  Procedure Laterality Date  . Coronary artery stenting  2005    Dr. Olevia Perches, 3 vessel  . Lithotripsy  2002  . Colonoscopy with polypectomy  2005    Dr. Verl Blalock  . Upper gastrointestinal endoscopy  2005    Esophageal reflux, hiatal hernia  . Bladder tumor excision  2011, 2012, 2013    Dr Estill Dooms, [facility]  . Cataract extraction  2003    Bilateral, [facility]    There were no vitals filed for this visit.      Subjective Assessment - 06/05/16 1207    Subjective Doing well today, Rt hip is about a 3/10. Had a lumbar support made for his long car ride.    Currently in Pain? Yes  see previous note for  details.    Pain Score 3    Pain Location Hip   Pain Orientation Right            [facility] Adult PT Treatment/Exercise - 06/05/16 1213    Lumbar Exercises: Stretches   Active Hamstring Stretch 2 reps;30 seconds   Single Knee to Chest Stretch 3 reps;30 seconds   Lower Trunk Rotation Limitations x 10, 2 sets, knees apart andnknees together    Lumbar Exercises: Sidelying   Clam 10 reps;Other (comment)   Hip Abduction 10 reps   Manual Therapy   Manual Therapy Muscle Energy Technique   Manual therapy comments long axis Rt. LE distraction    Muscle Energy Technique contract relax for Rt. ant pelvis rotation (resist hamstring)     Pt requires increased time and manual cueing for correct technique.             PT Education - 06/05/16 1223    Education provided Yes   Education Details reinforce HEP and new hip ex (did not do)    Person(s) Educated Patient   Methods Explanation;Demonstration;Tactile cues;Verbal cues   Comprehension Verbalized understanding;Returned demonstration  PT Short Term Goals - 05/27/16 1519    PT SHORT TERM GOAL #1   Title Pt will be I with initial HEP    Status On-going   PT SHORT TERM GOAL #2   Title Pt will complete Balance Screen and set goal, understand results.    Status On-going   PT SHORT TERM GOAL #3   Title Pt will be able to lie on Rt side for sleep and report only min pain at times.    Status On-going           PT Long Term Goals - 05/14/16 1353    PT LONG TERM GOAL #1   Title Pt will understand body mechanics and posture as it relates to daily functional mobility.    Time 8   Period Weeks   Status New   PT LONG TERM GOAL #2   Title Berg Balance goal to be assessed.    Time 8   Period Weeks   Status New   PT LONG TERM GOAL #3   Title Pt will be able to walk up to 2 miles and report only min pain in Rt. hip   Time 8   Period Weeks   Status New   PT LONG TERM GOAL #4   Title Pt will be able to be I with more  advanced HEP    Time 8   Period Weeks   Status New               Plan - 06/05/16 1230    Clinical Impression Statement Pt did not do HEP since last visit.  Cont to need cueing to complete.  Pain in hip reduced after session.    PT Next Visit Plan Stretch to Rt. hip, hip strength    PT Home Exercise Plan LTR, knee to chest, knee to opp shoulder, seated hamstring , QL stretch , hip abd and clam    Consulted and Agree with Plan of Care Patient      Patient will benefit from skilled therapeutic intervention in order to improve the following deficits and impairments:  Decreased range of motion, Difficulty walking, Increased fascial restricitons, Pain, Impaired flexibility, Hypomobility, Decreased balance, Decreased mobility, Decreased strength, Postural dysfunction  Visit Diagnosis: Other abnormalities of gait and mobility  Pain in right hip  Stiffness of right hip, not elsewhere classified  Abnormal posture  Muscle weakness (generalized)     Problem List Patient Active Problem List   Diagnosis Date Noted  . Essential hypertension, benign 06/16/2012  . CAD (coronary artery disease) 05/27/2011  . Bladder cancer ([place]) 05/27/2011  . Osteopenia 05/27/2011  . SKIN CANCER, HX OF 02/28/2009  . COLONIC POLYPS, HX OF 02/28/2009  . ELEVATED PROSTATE SPECIFIC ANTIGEN 03/28/2008  . Hyperlipidemia 01/31/2008  . HYPERPLASIA PROSTATE UNS W/O UR OBST & OTH LUTS 01/31/2008  . IMPOTENCE OF ORGANIC ORIGIN 01/31/2008  . Asthma 09/28/2007  . GERD 07/22/2007  . NEPHROLITHIASIS, HX OF 07/22/2007    Micaella Gitto 06/05/2016, 12:43 PM  [facility] [address] [place], [place], [address] Phone: [phone]   Fax:  [phone]  Name: VALGENE BERGAMO MRN: [id number] Date of Birth: 1928/12/25    Raeford Razor, PT 06/05/2016 12:44 PM Phone: [phone] Fax: [phone]

## 2016-06-10 ENCOUNTER — Ambulatory Visit: Payer: Medicare Other | Admitting: Physical Therapy

## 2016-06-10 DIAGNOSIS — M6281 Muscle weakness (generalized): Secondary | ICD-10-CM | POA: Diagnosis not present

## 2016-06-10 DIAGNOSIS — R2689 Other abnormalities of gait and mobility: Secondary | ICD-10-CM | POA: Diagnosis not present

## 2016-06-10 DIAGNOSIS — M25651 Stiffness of right hip, not elsewhere classified: Secondary | ICD-10-CM | POA: Diagnosis not present

## 2016-06-10 DIAGNOSIS — M25551 Pain in right hip: Secondary | ICD-10-CM | POA: Diagnosis not present

## 2016-06-10 DIAGNOSIS — R293 Abnormal posture: Secondary | ICD-10-CM | POA: Diagnosis not present

## 2016-06-10 NOTE — Patient Instructions (Signed)
Piriformis Stretch    Lying on back, pull right knee toward opposite shoulder. Hold ___30_ seconds. Repeat __2-3__ times. Do __2__ sessions per day.  http://gt2.exer.us/257   Copyright  VHI. All rights reserved.

## 2016-06-10 NOTE — Therapy (Signed)
[place], [place], [address] Phone: [phone]   Fax:  [phone]  Physical Therapy Treatment  Patient Details  Name: [name] [name] MRN: [id number] Date of Birth: [birth year] Referring Provider: Dr Jean Rosenthal  Encounter Date: 06/10/2016      PT End of Session - 06/10/16 1204    Visit Number 6   Number of Visits 16   Date for PT Re-Evaluation 07/09/16   PT Start Time 1147   PT Stop Time 8127   PT Time Calculation (min) 48 min   Activity Tolerance Patient tolerated treatment well   Behavior During Therapy [facility] for tasks assessed/performed      Past Medical History:  Diagnosis Date  . Asthma    post infectious  . Bladder cancer [facility])    Dr Estill Dooms, [facility]  . CAD (coronary artery disease)    a. s/p Cypher DES x 2 to prox LAD and Cypher DES x 1 to distal LAD 08/2009;  b. cath with residual D2 50% 10/05 and normal EF;  c. Myoview 6/11: EF 60%, mild fixed INF, inferoseptal, inferoapical thinning, no ischemia, low risk, EF 60%  . Colon polyps   . GERD (gastroesophageal reflux disease)   . HLD (hyperlipidemia)    h/o elevated LFTs on Zocor  . Nephrolithiasis   . Other abnormal glucose 2012   FBS 104  . Skin cancer, basal cell    Dr Sherrye Payor    Past Surgical History:  Procedure Laterality Date  . BLADDER TUMOR EXCISION  2011, 2012, 2013   Dr Estill Dooms, [facility]  . CATARACT EXTRACTION  2003   Bilateral, [facility]  . Colonoscopy with polypectomy  2005   Dr. Verl Blalock  . Coronary artery stenting  2005   Dr. Olevia Perches, 3 vessel  . LITHOTRIPSY  2002  . UPPER GASTROINTESTINAL ENDOSCOPY  2005   Esophageal reflux, hiatal hernia    There were no vitals filed for this visit.      Subjective Assessment - 06/10/16 1159    Subjective Same old, same old. My hip felt great all weekend, and then as I make my way here it hurts, 4/10.           [place] Adult PT Treatment/Exercise -  06/10/16 1202      Lumbar Exercises: Stretches   Active Hamstring Stretch 2 reps;30 seconds   Single Knee to Chest Stretch 3 reps;30 seconds   Single Knee to Chest Stretch Limitations to opposite shoulder    Lower Trunk Rotation Limitations x 10    Quad Stretch 1 rep;60 seconds   Quad Stretch Limitations bilat thomas test position L ITB tighter than Rt.      Lumbar Exercises: Aerobic   Stationary Bike NuStep L5 UE and LE for 7 min      Knee/Hip Exercises: Stretches   ITB Stretch Both;2 reps;30 seconds   Piriformis Stretch Both;2 reps;30 seconds     Manual Therapy   Manual therapy comments long axis Rt. LE distraction    Soft tissue mobilization Rt. QL in sidelying   Myofascial Release Rt. trunk                 PT Education - 06/10/16 1313    Education provided Yes   Education Details piriformis stretching, form with QL stretch and muscle function   Person(s) Educated Patient   Methods Explanation;Demonstration;Verbal cues;Tactile cues;Handout   Comprehension Verbalized understanding;Returned demonstration  PT Short Term Goals - 06/10/16 1315      PT SHORT TERM GOAL #1   Title Pt will be I with initial HEP    Status Partially Met     PT SHORT TERM GOAL #2   Title Pt will complete Balance Screen and set goal, understand results.            PT Long Term Goals - 06/10/16 1315      PT LONG TERM GOAL #1   Title Pt will understand body mechanics and posture as it relates to daily functional mobility.    Status On-going     PT LONG TERM GOAL #2   Title Berg Balance goal to be assessed.    Baseline score 52/56.  No goal needed   Status Deferred     PT LONG TERM GOAL #3   Title Pt will be able to walk up to 2 miles and report only min pain in Rt. hip   Status On-going     PT LONG TERM GOAL #4   Title Pt will be able to be I with more advanced HEP    Status On-going               Plan - 06/10/16 1208    Clinical Impression Statement  May miss next week due to having family in town. He does not consistently do HEP, needs cues.  Always feels better after stretching.     PT Next Visit Plan Stretch to Rt. hip, hip strength , manual to QL   PT Home Exercise Plan LTR, knee to chest, knee to opp shoulder, seated hamstring , QL stretch , hip abd and clam    Consulted and Agree with Plan of Care Patient      Patient will benefit from skilled therapeutic intervention in order to improve the following deficits and impairments:  Decreased range of motion, Difficulty walking, Increased fascial restricitons, Pain, Impaired flexibility, Hypomobility, Decreased balance, Decreased mobility, Decreased strength, Postural dysfunction  Visit Diagnosis: Other abnormalities of gait and mobility  Pain in right hip  Stiffness of right hip, not elsewhere classified  Abnormal posture  Muscle weakness (generalized)     Problem List Patient Active Problem List   Diagnosis Date Noted  . Essential hypertension, benign 06/16/2012  . CAD (coronary artery disease) 05/27/2011  . Bladder cancer ([place]) 05/27/2011  . Osteopenia 05/27/2011  . SKIN CANCER, HX OF 02/28/2009  . COLONIC POLYPS, HX OF 02/28/2009  . ELEVATED PROSTATE SPECIFIC ANTIGEN 03/28/2008  . Hyperlipidemia 01/31/2008  . HYPERPLASIA PROSTATE UNS W/O UR OBST & OTH LUTS 01/31/2008  . IMPOTENCE OF ORGANIC ORIGIN 01/31/2008  . Asthma 09/28/2007  . GERD 07/22/2007  . NEPHROLITHIASIS, HX OF 07/22/2007    Cierra Rothgeb 06/10/2016, 1:19 PM  [facility] [address] [place], [place], [address] Phone: [phone]   Fax:  [phone]  Name: KASEM MOZER MRN: [id number] Date of Birth: 1929/10/05   Raeford Razor, PT 06/10/16 1:19 PM Phone: [phone] Fax: [phone]

## 2016-06-13 ENCOUNTER — Encounter: Payer: Medicare Other | Admitting: Physical Therapy

## 2016-06-17 DIAGNOSIS — H6123 Impacted cerumen, bilateral: Secondary | ICD-10-CM | POA: Diagnosis not present

## 2016-06-23 ENCOUNTER — Encounter: Payer: Medicare Other | Admitting: Physical Therapy

## 2016-06-24 ENCOUNTER — Ambulatory Visit: Payer: Medicare Other | Attending: Orthopaedic Surgery | Admitting: Physical Therapy

## 2016-06-24 DIAGNOSIS — R2689 Other abnormalities of gait and mobility: Secondary | ICD-10-CM | POA: Diagnosis not present

## 2016-06-24 DIAGNOSIS — M6281 Muscle weakness (generalized): Secondary | ICD-10-CM | POA: Insufficient documentation

## 2016-06-24 DIAGNOSIS — M25551 Pain in right hip: Secondary | ICD-10-CM

## 2016-06-24 DIAGNOSIS — M25651 Stiffness of right hip, not elsewhere classified: Secondary | ICD-10-CM | POA: Diagnosis not present

## 2016-06-24 DIAGNOSIS — R2681 Unsteadiness on feet: Secondary | ICD-10-CM | POA: Diagnosis not present

## 2016-06-24 DIAGNOSIS — R293 Abnormal posture: Secondary | ICD-10-CM | POA: Insufficient documentation

## 2016-06-24 NOTE — Therapy (Signed)
[place], [place], [address] Phone: [phone]   Fax:  [phone]  Physical Therapy Treatment  Patient Details  Name: [name] [name] MRN: [id number] Date of Birth: 09-Aug-1929 Referring Provider: Dr Jean Rosenthal  Encounter Date: 06/24/2016      PT End of Session - 06/24/16 1441    Visit Number 7   Number of Visits 16   Date for PT Re-Evaluation 07/09/16   PT Start Time 1418   PT Stop Time 1500   PT Time Calculation (min) 42 min   Activity Tolerance Patient tolerated treatment well   Behavior During Therapy [facility] for tasks assessed/performed      Past Medical History:  Diagnosis Date  . Asthma    post infectious  . Bladder cancer [facility])    Dr Estill Dooms, [facility]  . CAD (coronary artery disease)    a. s/p Cypher DES x 2 to prox LAD and Cypher DES x 1 to distal LAD 08/2009;  b. cath with residual D2 50% 10/05 and normal EF;  c. Myoview 6/11: EF 60%, mild fixed INF, inferoseptal, inferoapical thinning, no ischemia, low risk, EF 60%  . Colon polyps   . GERD (gastroesophageal reflux disease)   . HLD (hyperlipidemia)    h/o elevated LFTs on Zocor  . Nephrolithiasis   . Other abnormal glucose 2012   FBS 104  . Skin cancer, basal cell    Dr Sherrye Payor    Past Surgical History:  Procedure Laterality Date  . BLADDER TUMOR EXCISION  2011, 2012, 2013   Dr Estill Dooms, [facility]  . CATARACT EXTRACTION  2003   Bilateral, [facility]  . Colonoscopy with polypectomy  2005   Dr. Verl Blalock  . Coronary artery stenting  2005   Dr. Olevia Perches, 3 vessel  . LITHOTRIPSY  2002  . UPPER GASTROINTESTINAL ENDOSCOPY  2005   Esophageal reflux, hiatal hernia    There were no vitals filed for this visit.      Subjective Assessment - 06/24/16 1422    Subjective My back is better, I've had my grandkids for a few days.  [place].  Did OK with a 1.5 mile hike.                  [place] Adult PT Treatment/Exercise - 06/24/16 1431      High Level Balance   High Level Balance Comments standing foam activites: march, UE and row blue band x 20, UE alternating flexion, head turns     Lumbar Exercises: Stretches   Active Hamstring Stretch 2 reps;30 seconds   Active Hamstring Stretch Limitations seated    Single Knee to Chest Stretch 3 reps;30 seconds   Single Knee to Chest Stretch Limitations to opposite shoulder    Lower Trunk Rotation Limitations x 10      Lumbar Exercises: Supine   Clam 10 reps   Bent Knee Raise --   Bridge --     Lumbar Exercises: Sidelying   Clam 20 reps   Hip Abduction 20 reps                PT Education - 06/24/16 1441    Education provided Yes   Education Details HEP review   Person(s) Educated Patient   Methods Explanation   Comprehension Verbalized understanding;Tactile cues required;Verbal cues required          PT Short Term Goals - 06/24/16 1423  PT SHORT TERM GOAL #1   Title Pt will be I with initial HEP    Status Partially Met     PT SHORT TERM GOAL #2   Title Pt will complete Balance Screen and set goal, understand results.    Status Achieved           PT Long Term Goals - 06/24/16 1427      PT LONG TERM GOAL #1   Title Pt will understand body mechanics and posture as it relates to daily functional mobility.    Status Achieved     PT LONG TERM GOAL #2   Title Berg Balance goal to be assessed.    Baseline score 52/56.  No goal needed   Status Deferred     PT LONG TERM GOAL #3   Title Pt will be able to walk up to 2 miles and report only min pain in Rt. hip   Baseline 1.5 mile hike, goal met   Status Partially Met     PT LONG TERM GOAL #4   Title Pt will be able to be I with more advanced HEP    Status On-going               Plan - 06/24/16 1442    Clinical Impression Statement Patient has had near resolution of pain, reports min with driving long  distances ([place] to the [place] routinely for work).  His balance needs some work but he may be ready for DC after next 1-2 visits.     PT Treatment/Interventions Neuromuscular re-education;Manual techniques;Therapeutic exercise;Moist Heat;Therapeutic activities;Electrical Stimulation;Cryotherapy;Gait training;Patient/family education;Passive range of motion;Ultrasound   PT Next Visit Plan balance, FOTO, DC?    PT Home Exercise Plan LTR, knee to chest, knee to opp shoulder, seated hamstring , QL stretch , hip abd and clam , bridge   Consulted and Agree with Plan of Care Patient      Patient will benefit from skilled therapeutic intervention in order to improve the following deficits and impairments:  Decreased range of motion, Difficulty walking, Increased fascial restricitons, Pain, Impaired flexibility, Hypomobility, Decreased balance, Decreased mobility, Decreased strength, Postural dysfunction  Visit Diagnosis: Other abnormalities of gait and mobility  Pain in right hip  Stiffness of right hip, not elsewhere classified  Abnormal posture  Muscle weakness (generalized)     Problem List Patient Active Problem List   Diagnosis Date Noted  . Essential hypertension, benign 06/16/2012  . CAD (coronary artery disease) 05/27/2011  . Bladder cancer ([place]) 05/27/2011  . Osteopenia 05/27/2011  . SKIN CANCER, HX OF 02/28/2009  . COLONIC POLYPS, HX OF 02/28/2009  . ELEVATED PROSTATE SPECIFIC ANTIGEN 03/28/2008  . Hyperlipidemia 01/31/2008  . HYPERPLASIA PROSTATE UNS W/O UR OBST & OTH LUTS 01/31/2008  . IMPOTENCE OF ORGANIC ORIGIN 01/31/2008  . Asthma 09/28/2007  . GERD 07/22/2007  . NEPHROLITHIASIS, HX OF 07/22/2007    Kyrene Longan 06/24/2016, 3:13 PM  [facility] [address] [place], [place], [address] Phone: [phone]   Fax:  [phone]  Name: [name] [name] MRN: [id number] Date of Birth: September 29, 1929   Raeford Razor,  PT 06/24/16 3:13 PM Phone: [phone] Fax: [phone]

## 2016-06-24 NOTE — Patient Instructions (Signed)
Bridging    Slowly raise buttocks from floor, keeping stomach tight. Repeat __10__ times per set. Do __1-2__ sets per session. Do ___2_ sessions per day.  http://orth.exer.us/1096   Copyright  VHI. All rights reserved.

## 2016-06-25 ENCOUNTER — Encounter: Payer: Medicare Other | Admitting: Physical Therapy

## 2016-06-25 ENCOUNTER — Ambulatory Visit: Payer: Medicare Other | Admitting: Physical Therapy

## 2016-06-25 DIAGNOSIS — R293 Abnormal posture: Secondary | ICD-10-CM

## 2016-06-25 DIAGNOSIS — R2681 Unsteadiness on feet: Secondary | ICD-10-CM | POA: Diagnosis not present

## 2016-06-25 DIAGNOSIS — M25551 Pain in right hip: Secondary | ICD-10-CM | POA: Diagnosis not present

## 2016-06-25 DIAGNOSIS — R2689 Other abnormalities of gait and mobility: Secondary | ICD-10-CM

## 2016-06-25 DIAGNOSIS — M6281 Muscle weakness (generalized): Secondary | ICD-10-CM | POA: Diagnosis not present

## 2016-06-25 DIAGNOSIS — M25651 Stiffness of right hip, not elsewhere classified: Secondary | ICD-10-CM | POA: Diagnosis not present

## 2016-06-25 NOTE — Therapy (Signed)
[place], [place], [address] Phone: [phone]   Fax:  [phone]  Physical Therapy Treatment  Patient Details  Name: [name] [name] MRN: [id number] Date of Birth: [birth year] Referring Provider: Dr Jean Rosenthal  Encounter Date: 06/25/2016      PT End of Session - 06/25/16 1546    Visit Number 8   Date for PT Re-Evaluation 07/09/16   PT Start Time 1503   PT Stop Time 0601   PT Time Calculation (min) 41 min   Activity Tolerance Patient tolerated treatment well   Behavior During Therapy [facility] for tasks assessed/performed      Past Medical History:  Diagnosis Date  . Asthma    post infectious  . Bladder cancer [facility])    Dr Estill Dooms, [facility]  . CAD (coronary artery disease)    a. s/p Cypher DES x 2 to prox LAD and Cypher DES x 1 to distal LAD 08/2009;  b. cath with residual D2 50% 10/05 and normal EF;  c. Myoview 6/11: EF 60%, mild fixed INF, inferoseptal, inferoapical thinning, no ischemia, low risk, EF 60%  . Colon polyps   . GERD (gastroesophageal reflux disease)   . HLD (hyperlipidemia)    h/o elevated LFTs on Zocor  . Nephrolithiasis   . Other abnormal glucose 2012   FBS 104  . Skin cancer, basal cell    Dr Sherrye Payor    Past Surgical History:  Procedure Laterality Date  . BLADDER TUMOR EXCISION  2011, 2012, 2013   Dr Estill Dooms, [facility]  . CATARACT EXTRACTION  2003   Bilateral, [facility]  . Colonoscopy with polypectomy  2005   Dr. Verl Blalock  . Coronary artery stenting  2005   Dr. Olevia Perches, 3 vessel  . LITHOTRIPSY  2002  . UPPER GASTROINTESTINAL ENDOSCOPY  2005   Esophageal reflux, hiatal hernia    There were no vitals filed for this visit.      Subjective Assessment - 06/25/16 1507    Subjective No pain, I'm having a great day!   Currently in Pain? No/denies            [facility] PT Assessment - 06/25/16 1515      AROM   Lumbar Flexion just below knees    Lumbar  Extension 25 deg    Lumbar - Right Rotation 25% no pain    Lumbar - Left Rotation 25% no pain      Strength   Right Hip Extension 4/5   Right Hip ABduction 4/5   Left Hip Extension 4+/5   Left Hip ABduction 4+/5   Right Knee Flexion 5/5   Right Knee Extension 5/5   Left Knee Flexion 5/5   Left Knee Extension 5/5     Flexibility   Hamstrings 40-45 deg   Quadriceps tight   ITB tight   Piriformis tight           [facility] Adult PT Treatment/Exercise - 06/25/16 1515      High Level Balance   High Level Balance Activities Backward walking;Head turns;Tandem walking   High Level Balance Comments standing ex in parallel bars: step overs and rolling of foam roller, static hold with head turns.  tandem walking in parallel bars, retro walking in parallel bars , min UE needed for all exercises   standing semi circles to disassociate pelvis and LE            PT Education - 06/25/16 1545  Education provided Yes   Education Details balance - Neuro rehab is more appropriate if he decides to pursue.    Person(s) Educated Patient   Methods Explanation   Comprehension Verbalized understanding          PT Short Term Goals - 07/09/2016 1546      PT SHORT TERM GOAL #1   Title Pt will be I with initial HEP    Status Achieved     PT SHORT TERM GOAL #2   Title Pt will complete Balance Screen and set goal, understand results.    Status Achieved           PT Long Term Goals - 07-09-2016 1547      PT LONG TERM GOAL #1   Title Pt will understand body mechanics and posture as it relates to daily functional mobility.    Status Achieved     PT LONG TERM GOAL #2   Title Berg Balance goal to be assessed.    Baseline score 52/56.  No goal needed   Status Deferred     PT LONG TERM GOAL #3   Title Pt will be able to walk up to 2 miles and report only min pain in Rt. hip   Status Partially Met     PT LONG TERM GOAL #4   Title Pt will be able to be I with more advanced HEP    Status  Achieved               Plan - 07-09-2016 1546    Clinical Impression Statement Patient is I with HEP and has met/partially met all LTGs.    PT Next Visit Plan DC    PT Home Exercise Plan LTR, knee to chest, knee to opp shoulder, seated hamstring , QL stretch , hip abd and clam , bridge   Consulted and Agree with Plan of Care Patient      Patient will benefit from skilled therapeutic intervention in order to improve the following deficits and impairments:  Decreased range of motion, Difficulty walking, Increased fascial restricitons, Pain, Impaired flexibility, Hypomobility, Decreased balance, Decreased mobility, Decreased strength, Postural dysfunction  Visit Diagnosis: Other abnormalities of gait and mobility  Pain in right hip  Stiffness of right hip, not elsewhere classified  Abnormal posture  Muscle weakness (generalized)       G-Codes - 07/09/2016 1549    Functional Assessment Tool Used clinical judgement    Functional Limitation Mobility: Walking and moving around   Mobility: Walking and Moving Around Current Status [phone]) At least 20 percent but less than 40 percent impaired, limited or restricted   Mobility: Walking and Moving Around Goal Status [phone]) At least 20 percent but less than 40 percent impaired, limited or restricted   Mobility: Walking and Moving Around Discharge Status [phone]) At least 20 percent but less than 40 percent impaired, limited or restricted      Problem List Patient Active Problem List   Diagnosis Date Noted  . Essential hypertension, benign 06/16/2012  . CAD (coronary artery disease) 05/27/2011  . Bladder cancer ([place]) 05/27/2011  . Osteopenia 05/27/2011  . SKIN CANCER, HX OF 02/28/2009  . COLONIC POLYPS, HX OF 02/28/2009  . ELEVATED PROSTATE SPECIFIC ANTIGEN 03/28/2008  . Hyperlipidemia 01/31/2008  . HYPERPLASIA PROSTATE UNS W/O UR OBST & OTH LUTS 01/31/2008  . IMPOTENCE OF ORGANIC ORIGIN 01/31/2008  . Asthma 09/28/2007  . GERD  07/22/2007  . NEPHROLITHIASIS, HX OF 07/22/2007    [name] [name] Jul 09, 2016,  3:51 PM  [place], [place], [address] Phone: [phone]   Fax:  [phone]  Name: [name] [name] MRN: [id number] Date of Birth: 10/20/1929   Raeford Razor, PT 06/25/16 3:51 PM Phone: [phone] Fax: [phone]   PHYSICAL THERAPY DISCHARGE SUMMARY  Visits from Start of Care: 8  Current functional level related to goals / functional outcomes: See above    Remaining deficits: Mild hip pain, balance   Education / Equipment: HEP, posture, lifting   Plan: Patient agrees to discharge.  Patient goals were met. Patient is being discharged due to being pleased with the current functional level.  ?????    Raeford Razor, PT 06/25/16 3:53 PM Phone: [phone] Fax: [phone]

## 2016-06-30 ENCOUNTER — Ambulatory Visit: Payer: Medicare Other | Admitting: Physical Therapy

## 2016-07-01 ENCOUNTER — Ambulatory Visit: Payer: Medicare Other | Admitting: Physical Therapy

## 2016-07-16 DIAGNOSIS — Z961 Presence of intraocular lens: Secondary | ICD-10-CM | POA: Diagnosis not present

## 2016-07-16 DIAGNOSIS — D239 Other benign neoplasm of skin, unspecified: Secondary | ICD-10-CM | POA: Diagnosis not present

## 2016-07-17 ENCOUNTER — Ambulatory Visit: Payer: Medicare Other | Admitting: Physical Therapy

## 2016-07-17 DIAGNOSIS — M25651 Stiffness of right hip, not elsewhere classified: Secondary | ICD-10-CM | POA: Diagnosis not present

## 2016-07-17 DIAGNOSIS — M25551 Pain in right hip: Secondary | ICD-10-CM | POA: Diagnosis not present

## 2016-07-17 DIAGNOSIS — R2681 Unsteadiness on feet: Secondary | ICD-10-CM | POA: Diagnosis not present

## 2016-07-17 DIAGNOSIS — M6281 Muscle weakness (generalized): Secondary | ICD-10-CM | POA: Diagnosis not present

## 2016-07-17 DIAGNOSIS — R2689 Other abnormalities of gait and mobility: Secondary | ICD-10-CM

## 2016-07-17 DIAGNOSIS — R293 Abnormal posture: Secondary | ICD-10-CM | POA: Diagnosis not present

## 2016-07-18 NOTE — Therapy (Signed)
[place] [address] [place], [place], [address] Phone: [phone]   Fax:  [phone]  Physical Therapy Evaluation  Patient Details  Name: [name] [name] MRN: [id number] Date of Birth: [birth year] Referring Provider: Alonza Bogus, DO  Encounter Date: 07/17/2016      PT End of Session - 07/18/16 0813    Visit Number 1   Number of Visits 9   Date for PT Re-Evaluation 09/15/16   Authorization Type Medicare, BCBS:  GCODE every 10th visit   PT Start Time 0810   PT Stop Time 0846   PT Time Calculation (min) 36 min   Activity Tolerance Patient tolerated treatment well   Behavior During Therapy [facility] for tasks assessed/performed      Past Medical History:  Diagnosis Date  . Asthma    post infectious  . Bladder cancer [facility])    Dr Estill Dooms, [facility]  . CAD (coronary artery disease)    a. s/p Cypher DES x 2 to prox LAD and Cypher DES x 1 to distal LAD 08/2009;  b. cath with residual D2 50% 10/05 and normal EF;  c. Myoview 6/11: EF 60%, mild fixed INF, inferoseptal, inferoapical thinning, no ischemia, low risk, EF 60%  . Colon polyps   . GERD (gastroesophageal reflux disease)   . HLD (hyperlipidemia)    h/o elevated LFTs on Zocor  . Nephrolithiasis   . Other abnormal glucose 2012   FBS 104  . Skin cancer, basal cell    Dr Sherrye Payor    Past Surgical History:  Procedure Laterality Date  . BLADDER TUMOR EXCISION  2011, 2012, 2013   Dr Estill Dooms, [facility]  . CATARACT EXTRACTION  2003   Bilateral, [facility]  . Colonoscopy with polypectomy  2005   Dr. Verl Blalock  . Coronary artery stenting  2005   Dr. Olevia Perches, 3 vessel  . LITHOTRIPSY  2002  . UPPER GASTROINTESTINAL ENDOSCOPY  2005   Esophageal reflux, hiatal hernia    There were no vitals filed for this visit.       Subjective Assessment - 07/17/16 0817    Subjective Have not actually fallen, but my balance is off once I first standing up.  More off  balance when outdoors and on unlevel surfaces.  Have tried using a cane, but I felt like I was depending on it.  Pt feels like he is floating sometimes.   Pertinent History bladder CA, Cardiac Stents; hip bursitis   Patient Stated Goals Pt wants to improve walking and balance.   Currently in Pain? No/denies            [facility] PT Assessment - 07/18/16 0808      Assessment   Medical Diagnosis gait instability, peripheral neuroapathy  delayed episode of care due to bout of hip bursitis   Referring Provider Wells Guiles Tat, DO   Onset Date/Surgical Date --  Dr. Carles Collet visit 03/2016     Precautions   Precautions Fall  recent history of hip bursitis-resolved with ortho PT     Balance Screen   Has the patient fallen in the past 6 months No   Has the patient had a decrease in activity level because of a fear of falling?  Yes   Is the patient reluctant to leave their home because of a fear of falling?  No     Home Environment   Living Environment Private residence   Living Arrangements Spouse/significant other   Type of Home  House   Home Access Stairs to enter   Entrance Stairs-Number of Steps 2   Entrance Stairs-Rails None   Home Layout Two level     Prior Function   Level of Independence Independent   Leisure Enjoys playing tennis-has not played recently     Posture/Postural Control   Posture/Postural Control Postural limitations   Postural Limitations Rounded Shoulders;Forward head;Left pelvic obliquity;Flexed trunk     Strength   Right Ankle Dorsiflexion 4/5   Right Ankle Plantar Flexion 3-/5   Left Ankle Dorsiflexion 4/5   Left Ankle Plantar Flexion 3-/5     Transfers   Transfers Sit to Stand;Stand to Sit   Sit to Stand 7: Independent   Stand to Sit 7: Independent     Ambulation/Gait   Ambulation/Gait Yes   Ambulation/Gait Assistance 6: Modified independent (Device/Increase time)   Ambulation Distance (Feet) 200 Feet   Assistive device None   Gait Pattern Step-through  pattern  veering/slowing with gait with head turns   Ambulation Surface Level;Indoor   Gait velocity 10.13 sec = 3.24 ft/sec  compared to 3.88 ft/sec last bout of neuro PT 10/2015     High Level Balance   High Level Balance Comments Pt able to stand eyes open and closed on solid surface:  30 seconds; able to stand eyes open on foam 30 seconds, able to stand eyes closed 10 seconds only, prior to opening eyes due to excess sway.  indicative of possible decr. vestib. system use for balance     Functional Gait  Assessment   Gait assessed  Yes   Gait Level Surface Walks 20 ft in less than 7 sec but greater than 5.5 sec, uses assistive device, slower speed, mild gait deviations, or deviates 6-10 in outside of the 12 in walkway width.  5.68 sec   Change in Gait Speed Able to smoothly change walking speed without loss of balance or gait deviation. Deviate no more than 6 in outside of the 12 in walkway width.   Gait with Horizontal Head Turns Performs head turns with moderate changes in gait velocity, slows down, deviates 10-15 in outside 12 in walkway width but recovers, can continue to walk.   Gait with Vertical Head Turns Performs task with slight change in gait velocity (eg, minor disruption to smooth gait path), deviates 6 - 10 in outside 12 in walkway width or uses assistive device   Gait and Pivot Turn Pivot turns safely within 3 sec and stops quickly with no loss of balance.   Step Over Obstacle Is able to step over one shoe box (4.5 in total height) without changing gait speed. No evidence of imbalance.   Gait with Narrow Base of Support Ambulates less than 4 steps heel to toe or cannot perform without assistance.   Gait with Eyes Closed Walks 20 ft, uses assistive device, slower speed, mild gait deviations, deviates 6-10 in outside 12 in walkway width. Ambulates 20 ft in less than 9 sec but greater than 7 sec.   Ambulating Backwards Walks 20 ft, uses assistive device, slower speed, mild gait  deviations, deviates 6-10 in outside 12 in walkway width.   Steps Alternating feet, must use rail.   Total Score 19   FGA comment: Scores <22/30 indicates increased fall risk  19/30 compared to 25/30 last bout of neuro PT 10/2015  PT Long Term Goals - 07/18/16 0819      PT LONG TERM GOAL #1   Title Pt will be independent with HEP to address balance and gait.  TARGET 08/16/16   Time 4   Period Weeks   Status New     PT LONG TERM GOAL #2   Title Sensory Organization test to be assessed, with goal to be written as appropriate.   Time 4   Period Weeks   Status New     PT LONG TERM GOAL #3   Title Pt will improve Functional Gait Assessment score to at least 22/30 for decreased fall risk.   Time 4   Period Weeks   Status New     PT LONG TERM GOAL #4   Title Pt will verbalize understanding of fall prevention in home/community environment.   Time 4   Period Weeks   Status New               Plan - 07/18/16 0814    Clinical Impression Statement Pt is an 82 year old male who presents to OP neuro PT with diagnosis of gait instability and peripheral neuropathy.  Pt was planning to come to therapy earlier in the summer, but was delayed due to hip bursitis, which required bout of ortho therapy at our [facility].  Pt's bursitis has resolved, and he now presents with c/o balance difficulties at times.  No falls, but difficulty on unlevel and outdoor surfaces.  He presents with decreased dynamic balance, decreased plantar flexion strength, increased sway with head movements with gait, likely decreased vestibular system use for balance.  Pt is at fall risk per Functional Gait Assessment score.  Pt would benefit from skilled physical therapy to target the above stated deficits to improve functional mobility and decrease fall risk.   Rehab Potential Good   PT Frequency 2x / week   PT Duration 4 weeks  plus eval   PT  Treatment/Interventions ADLs/Self Care Home Management;Functional mobility training;Gait training;Therapeutic activities;Therapeutic exercise;Balance training;Neuromuscular re-education;Patient/family education   PT Next Visit Plan Perform Sensory Organization test; pt to bring HEP from previous neuro PT sessions (ending 10/2015) in order to review/update as needed; dynamic balance, plantarflexion strengthening   Consulted and Agree with Plan of Care Patient      Patient will benefit from skilled therapeutic intervention in order to improve the following deficits and impairments:  Abnormal gait, Decreased balance, Decreased mobility, Decreased strength, Difficulty walking  Visit Diagnosis: Other abnormalities of gait and mobility  Unsteadiness on feet  Muscle weakness (generalized)      G-Codes - 2016-08-13 0821    Functional Assessment Tool Used Functional Gait Assessement 19/30, 3.24 ft/sec (decr. from 3.88 ft/sec in 10/2015); unable to maintain EC on foam >10 seconds without LOB   Functional Limitation Mobility: Walking and moving around   Mobility: Walking and Moving Around Current Status [phone]) At least 20 percent but less than 40 percent impaired, limited or restricted   Mobility: Walking and Moving Around Goal Status [phone]) At least 1 percent but less than 20 percent impaired, limited or restricted       Problem List Patient Active Problem List   Diagnosis Date Noted  . Essential hypertension, benign 06/16/2012  . CAD (coronary artery disease) 05/27/2011  . Bladder cancer ([place]) 05/27/2011  . Osteopenia 05/27/2011  . SKIN CANCER, HX OF 02/28/2009  . COLONIC POLYPS, HX OF 02/28/2009  . ELEVATED PROSTATE SPECIFIC ANTIGEN 03/28/2008  . Hyperlipidemia 01/31/2008  .  HYPERPLASIA PROSTATE UNS W/O UR OBST & OTH LUTS 01/31/2008  . IMPOTENCE OF ORGANIC ORIGIN 01/31/2008  . Asthma 09/28/2007  . GERD 07/22/2007  . NEPHROLITHIASIS, HX OF 07/22/2007    Frazier Butt. 07/18/2016,  8:23 AM [facility] [address] [place], [place], [address] Phone: [phone]   Fax:  [phone]  Name: [name] [name] MRN: [id number] Date of Birth: 08-22-1929

## 2016-07-23 ENCOUNTER — Ambulatory Visit: Payer: Medicare Other | Admitting: Physical Therapy

## 2016-07-28 ENCOUNTER — Ambulatory Visit: Payer: Medicare Other | Attending: Orthopaedic Surgery | Admitting: Physical Therapy

## 2016-07-28 DIAGNOSIS — M6281 Muscle weakness (generalized): Secondary | ICD-10-CM | POA: Insufficient documentation

## 2016-07-28 DIAGNOSIS — R2681 Unsteadiness on feet: Secondary | ICD-10-CM | POA: Diagnosis not present

## 2016-07-28 DIAGNOSIS — R2689 Other abnormalities of gait and mobility: Secondary | ICD-10-CM | POA: Insufficient documentation

## 2016-07-28 NOTE — Patient Instructions (Addendum)
  Stand in corner with chair in front  Feet Together (Compliant Surface) Head Motion - Eyes Open    With eyes open, standing on compliant surface: ___pillow_____, feet together, move head slowly: up and down, side to side, diagnonal Repeat __10__ times per session. Do _1-2___ sessions per day.  Feet Apart (Compliant Surface) Arm Motion - Eyes Closed    Stand on compliant surface: ____pillow____, feet shoulder width apart.  Close eyes and if easy move arms up and down: to front. Repeat _3x 15 seconds___ times per session. Do __1-2__ sessions per day.  Copyright  VHI. All rights reserved.

## 2016-07-28 NOTE — Therapy (Signed)
[facility] [address] [place], [place], [address] Phone: [phone]   Fax:  [phone]  Physical Therapy Treatment  Patient Details  Name: [name] [name] MRN: [id number] Date of Birth: 27-Aug-1929 Referring Provider: Alonza Bogus, DO  Encounter Date: 07/28/2016      PT End of Session - 07/28/16 1612    Visit Number 3   Number of Visits 9   Date for PT Re-Evaluation 09/15/16   Authorization Type Medicare, BCBS:  GCODE every 10th visit   PT Start Time 1450   PT Stop Time 1530   PT Time Calculation (min) 40 min   Activity Tolerance Patient tolerated treatment well   Behavior During Therapy [facility] for tasks assessed/performed      Past Medical History:  Diagnosis Date  . Asthma    post infectious  . Bladder cancer [facility])    Dr Estill Dooms, [facility]  . CAD (coronary artery disease)    a. s/p Cypher DES x 2 to prox LAD and Cypher DES x 1 to distal LAD 08/2009;  b. cath with residual D2 50% 10/05 and normal EF;  c. Myoview 6/11: EF 60%, mild fixed INF, inferoseptal, inferoapical thinning, no ischemia, low risk, EF 60%  . Colon polyps   . GERD (gastroesophageal reflux disease)   . HLD (hyperlipidemia)    h/o elevated LFTs on Zocor  . Nephrolithiasis   . Other abnormal glucose 2012   FBS 104  . Skin cancer, basal cell    Dr Sherrye Payor    Past Surgical History:  Procedure Laterality Date  . BLADDER TUMOR EXCISION  2011, 2012, 2013   Dr Estill Dooms, [facility]  . CATARACT EXTRACTION  2003   Bilateral, [facility]  . Colonoscopy with polypectomy  2005   Dr. Verl Blalock  . Coronary artery stenting  2005   Dr. Olevia Perches, 3 vessel  . LITHOTRIPSY  2002  . UPPER GASTROINTESTINAL ENDOSCOPY  2005   Esophageal reflux, hiatal hernia    There were no vitals filed for this visit.      Subjective Assessment - 07/28/16 1453    Subjective Pt slipped stepping up the stairs carrying clothes; no injury.   Pertinent History  bladder CA, Cardiac Stents; hip bursitis   Patient Stated Goals Pt wants to improve walking and balance.   Currently in Pain? No/denies            [facility] PT Assessment - 07/28/16 0001      Balance   Balance Assessed Yes  SOT- Score WNL in all categorites except vesibular.                          Balance Exercises - 07/28/16 1610      Balance Exercises: Standing   Standing Eyes Opened Narrow base of support (BOS);Head turns   Standing Eyes Closed Wide (BOA)           PT Education - 07/28/16 1610    Education provided Yes   Education Details Discussed the results of the SOT and plan of treatment to address vestibular deficits.  Inititated HEP for compliant surface training see pt instruction.   Person(s) Educated Patient   Methods Explanation;Demonstration;Verbal cues;Handout   Comprehension Verbalized understanding;Returned demonstration;Verbal cues required;Need further instruction          PT Short Term Goals - 06/25/16 1546      PT SHORT TERM GOAL #1   Title Pt will be  I with initial HEP    Status Achieved     PT SHORT TERM GOAL #2   Title Pt will complete Balance Screen and set goal, understand results.    Status Achieved           PT Long Term Goals - 07/18/16 0819      PT LONG TERM GOAL #1   Title Pt will be independent with HEP to address balance and gait.  TARGET 08/16/16   Time 4   Period Weeks   Status New     PT LONG TERM GOAL #2   Title Sensory Organization test to be assessed, with goal to be written as appropriate.   Time 4   Period Weeks   Status New     PT LONG TERM GOAL #3   Title Pt will improve Functional Gait Assessment score to at least 22/30 for decreased fall risk.   Time 4   Period Weeks   Status New     PT LONG TERM GOAL #4   Title Pt will verbalize understanding of fall prevention in home/community environment.   Time 4   Period Weeks   Status New               Plan - 07/28/16 1613     Clinical Impression Statement Pt score WNL in all categories of SOT except vestibular having trouble with conditions 5 (eyes open and wall moves) and 6 ( eyes open and floor and wall move). SOT composite score: 63.  Pt was challenges with standing balance on compliant surface with narrow BOS, eyes open and Wide BOS with eyes closed requiring intermittent UE support.                                                                   Rehab Potential Good   PT Frequency 2x / week   PT Duration 4 weeks  plus eval   PT Treatment/Interventions ADLs/Self Care Home Management;Functional mobility training;Gait training;Therapeutic activities;Therapeutic exercise;Balance training;Neuromuscular re-education;Patient/family education   PT Next Visit Plan  Review HEP given 07/28/16 and add  plantarflexion strengthening; pt to bring HEP from previous neuro PT sessions (ending 10/2015) in order to review/update as needed.   Consulted and Agree with Plan of Care Patient      Patient will benefit from skilled therapeutic intervention in order to improve the following deficits and impairments:  Abnormal gait, Decreased balance, Decreased mobility, Decreased strength, Difficulty walking  Visit Diagnosis: Other abnormalities of gait and mobility  Unsteadiness on feet     Problem List Patient Active Problem List   Diagnosis Date Noted  . Essential hypertension, benign 06/16/2012  . CAD (coronary artery disease) 05/27/2011  . Bladder cancer ([place]) 05/27/2011  . Osteopenia 05/27/2011  . SKIN CANCER, HX OF 02/28/2009  . COLONIC POLYPS, HX OF 02/28/2009  . ELEVATED PROSTATE SPECIFIC ANTIGEN 03/28/2008  . Hyperlipidemia 01/31/2008  . HYPERPLASIA PROSTATE UNS W/O UR OBST & OTH LUTS 01/31/2008  . IMPOTENCE OF ORGANIC ORIGIN 01/31/2008  . Asthma 09/28/2007  . GERD 07/22/2007  . NEPHROLITHIASIS, HX OF 07/22/2007    [name] [name], PTA  07/28/16, 4:21 PM [facility] [address] [place], [place], [address] Phone: [phone]  Fax:  [phone]  Name: [name] [name] MRN: [id number] Date of Birth: Jun 22, 1929

## 2016-07-31 ENCOUNTER — Ambulatory Visit: Payer: Medicare Other | Admitting: Physical Therapy

## 2016-08-04 ENCOUNTER — Ambulatory Visit: Payer: Medicare Other | Admitting: Physical Therapy

## 2016-08-04 DIAGNOSIS — M6281 Muscle weakness (generalized): Secondary | ICD-10-CM | POA: Diagnosis not present

## 2016-08-04 DIAGNOSIS — R2689 Other abnormalities of gait and mobility: Secondary | ICD-10-CM | POA: Diagnosis not present

## 2016-08-04 DIAGNOSIS — R2681 Unsteadiness on feet: Secondary | ICD-10-CM | POA: Diagnosis not present

## 2016-08-04 NOTE — Therapy (Signed)
[facility] [address] [place], [place], [address] Phone: [phone]   Fax:  [phone]  Physical Therapy Treatment  Patient Details  Name: [name] [name] MRN: [id number] Date of Birth: 1929-07-17 Referring Provider: Alonza Bogus, DO  Encounter Date: 08/04/2016      PT End of Session - 08/04/16 1325    Visit Number 3  corrected from last week   Number of Visits 9   Date for PT Re-Evaluation 09/15/16   Authorization Type Medicare, BCBS:  GCODE every 10th visit   PT Start Time 1232   PT Stop Time 1317   PT Time Calculation (min) 45 min   Activity Tolerance Patient tolerated treatment well   Behavior During Therapy [facility] for tasks assessed/performed      Past Medical History:  Diagnosis Date  . Asthma    post infectious  . Bladder cancer [facility])    Dr Estill Dooms, [facility]  . CAD (coronary artery disease)    a. s/p Cypher DES x 2 to prox LAD and Cypher DES x 1 to distal LAD 08/2009;  b. cath with residual D2 50% 10/05 and normal EF;  c. Myoview 6/11: EF 60%, mild fixed INF, inferoseptal, inferoapical thinning, no ischemia, low risk, EF 60%  . Colon polyps   . GERD (gastroesophageal reflux disease)   . HLD (hyperlipidemia)    h/o elevated LFTs on Zocor  . Nephrolithiasis   . Other abnormal glucose 2012   FBS 104  . Skin cancer, basal cell    Dr Sherrye Payor    Past Surgical History:  Procedure Laterality Date  . BLADDER TUMOR EXCISION  2011, 2012, 2013   Dr Estill Dooms, [facility]  . CATARACT EXTRACTION  2003   Bilateral, [facility]  . Colonoscopy with polypectomy  2005   Dr. Verl Blalock  . Coronary artery stenting  2005   Dr. Olevia Perches, 3 vessel  . LITHOTRIPSY  2002  . UPPER GASTROINTESTINAL ENDOSCOPY  2005   Esophageal reflux, hiatal hernia    There were no vitals filed for this visit.      Subjective Assessment - 08/04/16 1233    Subjective Pt was busy this weekend but  Performed some HEP   Pertinent  History bladder CA, Cardiac Stents; hip bursitis   Patient Stated Goals Pt wants to improve walking and balance.   Currently in Pain? No/denies                              Balance Exercises - 08/04/16 1245      Balance Exercises: Standing   Stepping Strategy Anterior  difficulty coordinated weight shift.   Rockerboard Anterior/posterior;Lateral;Intermittent UE support   Tandem Gait Forward   Marching Limitations high knees   Other Standing Exercises See pt instruction           PT Education - 08/04/16 1244    Education provided Yes   Education Details HEP review   Person(s) Educated Patient   Methods Explanation;Demonstration;Verbal cues;Handout   Comprehension Verbalized understanding;Returned demonstration          PT Short Term Goals - 06/25/16 1546      PT SHORT TERM GOAL #1   Title Pt will be I with initial HEP    Status Achieved     PT SHORT TERM GOAL #2   Title Pt will complete Balance Screen and set goal, understand results.    Status Achieved  PT Long Term Goals - 07/18/16 0819      PT LONG TERM GOAL #1   Title Pt will be independent with HEP to address balance and gait.  TARGET 08/16/16   Time 4   Period Weeks   Status New     PT LONG TERM GOAL #2   Title Sensory Organization test to be assessed, with goal to be written as appropriate.   Time 4   Period Weeks   Status New     PT LONG TERM GOAL #3   Title Pt will improve Functional Gait Assessment score to at least 22/30 for decreased fall risk.   Time 4   Period Weeks   Status New     PT LONG TERM GOAL #4   Title Pt will verbalize understanding of fall prevention in home/community environment.   Time 4   Period Weeks   Status New               Plan - 08/04/16 1327    Clinical Impression Statement Pt demstrates imblance with standing on compliant surface with narrow BOS and on level surface with narrow BOS requiring intermittent UE support.  Pt  demonstrates understanding of HEP   Rehab Potential Good   PT Frequency 2x / week   PT Duration 4 weeks   PT Treatment/Interventions ADLs/Self Care Home Management;Functional mobility training;Gait training;Therapeutic activities;Therapeutic exercise;Balance training;Neuromuscular re-education;Patient/family education   PT Next Visit Plan Stepping strategies and compliant surface training; add  plantarflexion strengthening to HEP; pt to bring HEP from previous neuro PT sessions (ending 10/2015) in order to review/update as needed.   PT Home Exercise Plan LTR, knee to chest, knee to opp shoulder, seated hamstring , QL stretch , hip abd and clam , bridge      Patient will benefit from skilled therapeutic intervention in order to improve the following deficits and impairments:  Abnormal gait, Decreased balance, Decreased mobility, Decreased strength, Difficulty walking  Visit Diagnosis: Other abnormalities of gait and mobility  Unsteadiness on feet  Muscle weakness (generalized)     Problem List Patient Active Problem List   Diagnosis Date Noted  . Essential hypertension, benign 06/16/2012  . CAD (coronary artery disease) 05/27/2011  . Bladder cancer ([place]) 05/27/2011  . Osteopenia 05/27/2011  . SKIN CANCER, HX OF 02/28/2009  . COLONIC POLYPS, HX OF 02/28/2009  . ELEVATED PROSTATE SPECIFIC ANTIGEN 03/28/2008  . Hyperlipidemia 01/31/2008  . HYPERPLASIA PROSTATE UNS W/O UR OBST & OTH LUTS 01/31/2008  . IMPOTENCE OF ORGANIC ORIGIN 01/31/2008  . Asthma 09/28/2007  . GERD 07/22/2007  . NEPHROLITHIASIS, HX OF 07/22/2007    Bjorn Loser, PTA  08/04/16, 1:32 PM [facility] [address] [place], [place], [address] Phone: [phone]   Fax:  [phone]  Name: [name] [name] MRN: [id number] Date of Birth: [birth year]

## 2016-08-04 NOTE — Patient Instructions (Signed)
  Stand in corner with chair in front  Feet Together (Compliant Surface) Head Motion - Eyes Open    With eyes open, standing on compliant surface: ___pillow_____, feet together, move head slowly: up and down, side to side, diagnonal Repeat __10__ times per session. Do _1-2___ sessions per day.  Feet Apart (Compliant Surface) Arm Motion - Eyes Closed    Stand on compliant surface: ____pillow____, feet shoulder width apart.  Close eyes and if easy move arms up and down: to front. Repeat _3x 15 seconds___ times per session. Do __1-2__ sessions per day.  Copyright  VHI. All rights reserved.

## 2016-08-06 ENCOUNTER — Ambulatory Visit: Payer: Medicare Other | Admitting: Physical Therapy

## 2016-08-06 DIAGNOSIS — M6281 Muscle weakness (generalized): Secondary | ICD-10-CM

## 2016-08-06 DIAGNOSIS — R2689 Other abnormalities of gait and mobility: Secondary | ICD-10-CM | POA: Diagnosis not present

## 2016-08-06 DIAGNOSIS — R2681 Unsteadiness on feet: Secondary | ICD-10-CM
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
## 2016-08-08 ENCOUNTER — Other Ambulatory Visit: Payer: Self-pay | Admitting: Internal Medicine

## 2016-08-08 ENCOUNTER — Other Ambulatory Visit: Payer: Self-pay | Admitting: Cardiovascular Disease

## 2016-08-08 NOTE — Telephone Encounter (Signed)
Not on current med list, please advise

## 2016-08-11 ENCOUNTER — Ambulatory Visit: Payer: Medicare Other | Admitting: Physical Therapy

## 2016-08-11 DIAGNOSIS — R2681 Unsteadiness on feet: Secondary | ICD-10-CM | POA: Diagnosis not present

## 2016-08-11 DIAGNOSIS — R2689 Other abnormalities of gait and mobility: Secondary | ICD-10-CM

## 2016-08-11 DIAGNOSIS — M6281 Muscle weakness (generalized): Secondary | ICD-10-CM | POA: Diagnosis not present

## 2016-08-12 NOTE — Therapy (Signed)
[facility] [address] [place], [place], [address] Phone: [phone]   Fax:  [phone]  Physical Therapy Treatment  Patient Details  Name: [name] [name] MRN: [id number] Date of Birth: Sep 30, 1929 Referring Provider: Alonza Bogus, DO  Encounter Date: 08/11/2016      PT End of Session - 08/12/16 1304    Visit Number 5   Number of Visits 9   Date for PT Re-Evaluation 09/15/16   Authorization Type Medicare, BCBS:  GCODE every 10th visit   PT Start Time 1105   PT Stop Time 1144   PT Time Calculation (min) 39 min   Activity Tolerance Patient tolerated treatment well   Behavior During Therapy [facility] for tasks assessed/performed      Past Medical History:  Diagnosis Date  . Asthma    post infectious  . Bladder cancer [facility])    Dr Estill Dooms, [facility]  . CAD (coronary artery disease)    a. s/p Cypher DES x 2 to prox LAD and Cypher DES x 1 to distal LAD 08/2009;  b. cath with residual D2 50% 10/05 and normal EF;  c. Myoview 6/11: EF 60%, mild fixed INF, inferoseptal, inferoapical thinning, no ischemia, low risk, EF 60%  . Colon polyps   . GERD (gastroesophageal reflux disease)   . HLD (hyperlipidemia)    h/o elevated LFTs on Zocor  . Nephrolithiasis   . Other abnormal glucose 2012   FBS 104  . Skin cancer, basal cell    Dr Sherrye Payor    Past Surgical History:  Procedure Laterality Date  . BLADDER TUMOR EXCISION  2011, 2012, 2013   Dr Estill Dooms, [facility]  . CATARACT EXTRACTION  2003   Bilateral, [facility]  . Colonoscopy with polypectomy  2005   Dr. Verl Blalock  . Coronary artery stenting  2005   Dr. Olevia Perches, 3 vessel  . LITHOTRIPSY  2002  . UPPER GASTROINTESTINAL ENDOSCOPY  2005   Esophageal reflux, hiatal hernia    There were no vitals filed for this visit.      Subjective Assessment - 08/11/16 1106    Subjective Been walking and exercising and my legs are a little achy.   Pertinent History bladder  CA, Cardiac Stents; hip bursitis   Patient Stated Goals Pt wants to improve walking and balance.   Currently in Pain? No/denies                         [facility] Adult PT Treatment/Exercise - 08/12/16 1248      High Level Balance   High Level Balance Comments At counter:  forward step, side step and weightshift x 10 reps each with intermittent UE support.  Stagger stance forward/back weightshifting x 10 reps each position, for dynamic weightshifting and lower leg flexibility..  Heel/toe raises x 20 reps      Self-Care   Self-Care Other Self-Care Comments   Other Self-Care Comments  Pt has multiple questions regarding gait instability, peripheral neuropathy, how that ties into balance.  Attempted to answer pt's questions regarding POC and how we are addressing balance through therapy exercises and HEP.             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  Gait activities short distance in gym, with walking poles for facilitation of reciprocal arm swing.            PT Long Term Goals - 08/07/16 1451      PT LONG TERM GOAL #1   Title Pt will be independent with HEP to address balance and gait.  TARGET 08/16/16   Time 4   Period Weeks   Status New     PT LONG TERM GOAL #2   Title Sensory Organization test to be assessed, with goal to be written as appropriate.  GOAL:  Improve vestibular system performance on SOT by at least 10%.-AWM   Time 4   Period Weeks   Status New     PT LONG TERM GOAL #3   Title Pt will improve Functional Gait Assessment score to at least 22/30 for decreased fall risk.   Time 4   Period Weeks   Status New     PT LONG TERM GOAL #4   Title Pt will verbalize understanding of fall prevention in  home/community environment.   Time 4   Period Weeks   Status New               Plan - 08/12/16 1305    Clinical Impression Statement Skilled therapy session focused on dynamic balance and complaint surface work for balance.  Pt is continuing to progress with therapy and will continue to benefit from further skilled PT to address balance and gait.   Rehab Potential Good   PT Frequency 2x / week   PT Duration 4 weeks   PT Treatment/Interventions ADLs/Self Care Home Management;Functional mobility training;Gait training;Therapeutic activities;Therapeutic exercise;Balance training;Neuromuscular re-education;Patient/family education   PT Next Visit Plan Add Stepping strategies and compliant surface training to HEP as able; continue to work on compliant surface balance and gait activities  Pt did not bring HEP ex to 08/11/16 session   Consulted and Agree with Plan of Care Patient      Patient will benefit from skilled therapeutic intervention in order to improve the following deficits and impairments:  Abnormal gait, Decreased balance, Decreased mobility, Decreased strength, Difficulty walking  Visit Diagnosis: Other abnormalities of gait and mobility  Unsteadiness on feet     Problem List Patient Active Problem List   Diagnosis Date Noted  . Essential hypertension, benign 06/16/2012  . CAD (coronary artery disease) 05/27/2011  . Bladder cancer ([place]) 05/27/2011  . Osteopenia 05/27/2011  . SKIN CANCER, HX OF 02/28/2009  . COLONIC POLYPS, HX OF 02/28/2009  . ELEVATED PROSTATE SPECIFIC ANTIGEN 03/28/2008  . Hyperlipidemia 01/31/2008  . HYPERPLASIA PROSTATE UNS W/O UR OBST & OTH LUTS 01/31/2008  . IMPOTENCE OF ORGANIC ORIGIN 01/31/2008  . Asthma 09/28/2007  . GERD 07/22/2007  . NEPHROLITHIASIS, HX OF 07/22/2007    Ebert Forrester W. 08/12/2016, 1:12 PM  [facility]razier Butt., PT [facility] [address] [place],  [place], [address] Phone: [phone]   Fax:  [phone]  Name: RIAL SABIR MRN: [id number] Date of Birth: Aug 27, 1929

## 2016-08-14 ENCOUNTER — Ambulatory Visit: Payer: Medicare Other | Admitting: Physical Therapy

## 2016-08-14 DIAGNOSIS — M6281 Muscle weakness (generalized): Secondary | ICD-10-CM | POA: Diagnosis not present

## 2016-08-14 DIAGNOSIS — R2689 Other abnormalities of gait and mobility: Secondary | ICD-10-CM

## 2016-08-14 DIAGNOSIS — R2681 Unsteadiness on feet: Secondary | ICD-10-CM | POA: Diagnosis not present

## 2016-08-14 NOTE — Therapy (Signed)
[facility] [address] [place], [place], [address] Phone: [phone]   Fax:  [phone]  Physical Therapy Treatment  Patient Details  Name: [name] [name] MRN: [id number] Date of Birth: 1929/07/23 Referring Provider: Alonza Bogus, DO  Encounter Date: 08/14/2016      PT End of Session - 08/14/16 1555    Visit Number 6   Number of Visits 9   Date for PT Re-Evaluation 09/15/16   Authorization Type Medicare, BCBS:  GCODE every 10th visit   PT Start Time 1455   PT Stop Time 1540   PT Time Calculation (min) 45 min   Equipment Utilized During Treatment Gait belt   Activity Tolerance Patient tolerated treatment well   Behavior During Therapy [facility] for tasks assessed/performed      Past Medical History:  Diagnosis Date  . Asthma    post infectious  . Bladder cancer [facility])    Dr Estill Dooms, [facility]  . CAD (coronary artery disease)    a. s/p Cypher DES x 2 to prox LAD and Cypher DES x 1 to distal LAD 08/2009;  b. cath with residual D2 50% 10/05 and normal EF;  c. Myoview 6/11: EF 60%, mild fixed INF, inferoseptal, inferoapical thinning, no ischemia, low risk, EF 60%  . Colon polyps   . GERD (gastroesophageal reflux disease)   . HLD (hyperlipidemia)    h/o elevated LFTs on Zocor  . Nephrolithiasis   . Other abnormal glucose 2012   FBS 104  . Skin cancer, basal cell    Dr Sherrye Payor    Past Surgical History:  Procedure Laterality Date  . BLADDER TUMOR EXCISION  2011, 2012, 2013   Dr Estill Dooms, [facility]  . CATARACT EXTRACTION  2003   Bilateral, [facility]  . Colonoscopy with polypectomy  2005   Dr. Verl Blalock  . Coronary artery stenting  2005   Dr. Olevia Perches, 3 vessel  . LITHOTRIPSY  2002  . UPPER GASTROINTESTINAL ENDOSCOPY  2005   Esophageal reflux, hiatal hernia    There were no vitals filed for this visit.      Subjective Assessment - 08/14/16 1457    Subjective Feel like I'm doing better walking, but  HEP is still challenging   Pertinent History bladder CA, Cardiac Stents; hip bursitis   Patient Stated Goals Pt wants to improve walking and balance.   Currently in Pain? No/denies                         [facility] Adult PT Treatment/Exercise - 08/14/16 0001      Ambulation/Gait   Ambulation/Gait Yes   Ambulation/Gait Assistance 6: Modified independent (Device/Increase time)   Ambulation Distance (Feet) 1000 Feet   Assistive device None  Trialled walking sticks for option on rougher terrain, cues for stick placement.   Gait Pattern Step-through pattern  working on maintaining good bilateral foot clearance   Ambulation Surface Unlevel;Indoor;Outdoor;Paved;Gravel;Grass   Stairs Yes   Stairs Assistance 6: Modified independent (Device/Increase time)  multiple reps, visual strategies to increase stability.   Stair Management Technique No rails;Alternating pattern   Curb 6: Modified independent (Device/increase time)             Balance Exercises - 08/14/16 1550      Balance Exercises: Standing   Stepping Strategy Lateral;Posterior  tennis type steps with lunge and arm swing.    Turning Both  worked on tennis type drills- fast walking with fast  turns           PT Education - 08/14/16 1554    Education provided Yes   Education Details Encouraged pt to perform walking and stepping drills that he used to do for tennis, starting for about 10 min and gradually increasing as tolerated.   Person(s) Educated Patient   Methods Explanation;Demonstration   Comprehension Verbalized understanding;Returned demonstration          PT Short Term Goals - 06/25/16 1546      PT SHORT TERM GOAL #1   Title Pt will be I with initial HEP    Status Achieved     PT SHORT TERM GOAL #2   Title Pt will complete Balance Screen and set goal, understand results.    Status Achieved           PT Long Term Goals - 08/07/16 1451      PT LONG TERM GOAL #1   Title Pt will be  independent with HEP to address balance and gait.  TARGET 08/16/16   Time 4   Period Weeks   Status New     PT LONG TERM GOAL #2   Title Sensory Organization test to be assessed, with goal to be written as appropriate.  GOAL:  Improve vestibular system performance on SOT by at least 10%.-AWM   Time 4   Period Weeks   Status New     PT LONG TERM GOAL #3   Title Pt will improve Functional Gait Assessment score to at least 22/30 for decreased fall risk.   Time 4   Period Weeks   Status New     PT LONG TERM GOAL #4   Title Pt will verbalize understanding of fall prevention in home/community environment.   Time 4   Period Weeks   Status New               Plan - 08/14/16 1557    Clinical Impression Statement Pt demonstrates greater confidence and balance with dynamic walking over challenging surfaces with changes in direction and visual scanning.  Pt was able to demonstrate fast walking and stepping drill for tennis safely with no LOB.   Rehab Potential Good   PT Frequency 2x / week   PT Duration 4 weeks   PT Treatment/Interventions ADLs/Self Care Home Management;Functional mobility training;Gait training;Therapeutic activities;Therapeutic exercise;Balance training;Neuromuscular re-education;Patient/family education   PT Next Visit Plan Tennis drills, Add Stepping strategies (crossovers) and compliant surface training to HEP as able; continue to work on compliant surface balance and gait activities  Pt did not bring HEP ex to 08/11/16 session   Consulted and Agree with Plan of Care Patient      Patient will benefit from skilled therapeutic intervention in order to improve the following deficits and impairments:  Abnormal gait, Decreased balance, Decreased mobility, Decreased strength, Difficulty walking  Visit Diagnosis: Other abnormalities of gait and mobility  Unsteadiness on feet  Muscle weakness (generalized)     Problem List Patient Active Problem List    Diagnosis Date Noted  . Essential hypertension, benign 06/16/2012  . CAD (coronary artery disease) 05/27/2011  . Bladder cancer ([place]) 05/27/2011  . Osteopenia 05/27/2011  . SKIN CANCER, HX OF 02/28/2009  . COLONIC POLYPS, HX OF 02/28/2009  . ELEVATED PROSTATE SPECIFIC ANTIGEN 03/28/2008  . Hyperlipidemia 01/31/2008  . HYPERPLASIA PROSTATE UNS W/O UR OBST & OTH LUTS 01/31/2008  . IMPOTENCE OF ORGANIC ORIGIN 01/31/2008  . Asthma 09/28/2007  . GERD 07/22/2007  .  NEPHROLITHIASIS, HX OF 07/22/2007    Bjorn Loser, PTA  08/14/16, 4:04 PM [facility] [address] [place], [place], [address] Phone: [phone]   Fax:  [phone]  Name: [name] [name] MRN: [id number] Date of Birth: 01/07/1929

## 2016-08-21 ENCOUNTER — Ambulatory Visit: Payer: Medicare Other | Attending: Orthopaedic Surgery | Admitting: Physical Therapy

## 2016-08-21 DIAGNOSIS — R2689 Other abnormalities of gait and mobility: Secondary | ICD-10-CM | POA: Insufficient documentation

## 2016-08-21 DIAGNOSIS — R2681 Unsteadiness on feet: Secondary | ICD-10-CM

## 2016-08-21 DIAGNOSIS — M6281 Muscle weakness (generalized): Secondary | ICD-10-CM | POA: Insufficient documentation

## 2016-08-21 NOTE — Therapy (Signed)
[facility] [address] [place], [place], [address] Phone: [phone]   Fax:  [phone]  Physical Therapy Treatment  Patient Details  Name: [name] [name] MRN: [id number] Date of Birth: 12-Oct-1929 Referring Provider: Alonza Bogus, DO  Encounter Date: 08/21/2016      PT End of Session - 08/21/16 1139    Visit Number 7   Number of Visits 9   Date for PT Re-Evaluation 09/15/16   Authorization Type Medicare, BCBS:  GCODE every 10th visit   PT Start Time 0930   PT Stop Time 1020   PT Time Calculation (min) 50 min   Activity Tolerance Patient tolerated treatment well   Behavior During Therapy [facility] for tasks assessed/performed      Past Medical History:  Diagnosis Date  . Asthma    post infectious  . Bladder cancer [facility])    Dr Estill Dooms, [facility]  . CAD (coronary artery disease)    a. s/p Cypher DES x 2 to prox LAD and Cypher DES x 1 to distal LAD 08/2009;  b. cath with residual D2 50% 10/05 and normal EF;  c. Myoview 6/11: EF 60%, mild fixed INF, inferoseptal, inferoapical thinning, no ischemia, low risk, EF 60%  . Colon polyps   . GERD (gastroesophageal reflux disease)   . HLD (hyperlipidemia)    h/o elevated LFTs on Zocor  . Nephrolithiasis   . Other abnormal glucose 2012   FBS 104  . Skin cancer, basal cell    Dr Sherrye Payor    Past Surgical History:  Procedure Laterality Date  . BLADDER TUMOR EXCISION  2011, 2012, 2013   Dr Estill Dooms, [facility]  . CATARACT EXTRACTION  2003   Bilateral, [facility]  . Colonoscopy with polypectomy  2005   Dr. Verl Blalock  . Coronary artery stenting  2005   Dr. Olevia Perches, 3 vessel  . LITHOTRIPSY  2002  . UPPER GASTROINTESTINAL ENDOSCOPY  2005   Esophageal reflux, hiatal hernia    There were no vitals filed for this visit.      Subjective Assessment - 08/21/16 0935    Subjective Static standing continues to be difficult but walking is better.  Most evenings pt  exercises 15-20 min/day rotating through his exercises including some Tai Chi moves.  Performs longer walks a couple times/ week.   Pertinent History bladder CA, Cardiac Stents; hip bursitis   Limitations Standing;Walking;House hold activities;Other (comment)   How long can you sit comfortably? 30 min    How long can you stand comfortably? better than walking , about 60 min pain was minimal this past weekend   How long can you walk comfortably? walked about a mile, moderate pain 6/10 (walking up a hill)    Diagnostic tests XR 6-8 mos ago    Patient Stated Goals Pt wants to improve walking and balance.   Currently in Pain? Yes   Pain Location Leg   Pain Orientation Left   Pain Descriptors / Indicators Cramping   Pain Type Acute pain   Pain Onset 1 to 4 weeks ago   Pain Frequency Intermittent   Aggravating Factors  calf cramps at night and tightness during the day.   Pain Relieving Factors walk around            [facility] PT Assessment - 08/21/16 0001      Functional Gait  Assessment   Gait assessed  Yes   Gait Level Surface Walks 20 ft in less than 5.5  sec, no assistive devices, good speed, no evidence for imbalance, normal gait pattern, deviates no more than 6 in outside of the 12 in walkway width.   Change in Gait Speed Able to smoothly change walking speed without loss of balance or gait deviation. Deviate no more than 6 in outside of the 12 in walkway width.   Gait with Horizontal Head Turns Performs head turns smoothly with no change in gait. Deviates no more than 6 in outside 12 in walkway width   Gait with Vertical Head Turns Performs head turns with no change in gait. Deviates no more than 6 in outside 12 in walkway width.   Gait and Pivot Turn Pivot turns safely within 3 sec and stops quickly with no loss of balance.   Step Over Obstacle Is able to step over 2 stacked shoe boxes taped together (9 in total height) without changing gait speed. No evidence of imbalance.   Gait with  Narrow Base of Support Ambulates less than 4 steps heel to toe or cannot perform without assistance.   Gait with Eyes Closed Walks 20 ft, slow speed, abnormal gait pattern, evidence for imbalance, deviates 10-15 in outside 12 in walkway width. Requires more than 9 sec to ambulate 20 ft.   Ambulating Backwards Walks 20 ft, uses assistive device, slower speed, mild gait deviations, deviates 6-10 in outside 12 in walkway width.   Steps Alternating feet, no rail.   Total Score 24                     [facility] Adult PT Treatment/Exercise - 08/21/16 0001      Ankle Exercises: Stretches   Gastroc Stretch 2 reps;20 seconds  Supine (with sheet around ball of foot) and standing (with 2"block)                PT Education - 08/21/16 1136    Education provided Yes   Education Details Reviewed goals check. Discussed Fall prevention, continuing fitness program, and importance and method for grading new activities to increase activity tolerance.  Gave pt supine and standing gastroc stretch to address tightness and cramping.   Person(s) Educated Patient   Methods Explanation;Demonstration;Verbal cues   Comprehension Verbalized understanding;Returned demonstration          PT Short Term Goals - 06/25/16 1546      PT SHORT TERM GOAL #1   Title Pt will be I with initial HEP    Status Achieved     PT SHORT TERM GOAL #2   Title Pt will complete Balance Screen and set goal, understand results.    Status Achieved           PT Long Term Goals - 08/21/16 1140      PT LONG TERM GOAL #1   Title Pt will be independent with HEP to address balance and gait.  TARGET 08/16/16   Baseline Met, 08/21/16.   Time 4   Period Weeks   Status Achieved     PT LONG TERM GOAL #2   Title Sensory Organization test to be assessed, with goal to be written as appropriate.  GOAL:  Improve vestibular system performance on SOT by at least 10%.-AWM   Time 4   Period Weeks   Status New     PT LONG  TERM GOAL #3   Title Pt will improve Functional Gait Assessment score to at least 22/30 for decreased fall risk.   Baseline FGA 24/30. Met. 08/21/16.  Time 4   Period Weeks   Status Achieved     PT LONG TERM GOAL #4   Title Pt will verbalize understanding of fall prevention in home/community environment.   Baseline Met, 08/21/16   Time 4   Period Weeks   Status Achieved               Plan - 08/21/16 1145    Clinical Impression Statement Pt met all LTGs checked today.  Reports continuing to feel off balance with static standing and is continuing to work on corner standing balance exercises at home and Tai chi.  Pt has a good continuing fitness program and is ready for d/c next visit.   Rehab Potential Good   PT Frequency 2x / week   PT Duration 4 weeks   PT Treatment/Interventions ADLs/Self Care Home Management;Functional mobility training;Gait training;Therapeutic activities;Therapeutic exercise;Balance training;Neuromuscular re-education;Patient/family education   PT Next Visit Plan Check LTG # 2 and D/C next visit.  Pt did not bring HEP ex to 08/11/16 session   Consulted and Agree with Plan of Care Patient      Patient will benefit from skilled therapeutic intervention in order to improve the following deficits and impairments:  Abnormal gait, Decreased balance, Decreased mobility, Decreased strength, Difficulty walking  Visit Diagnosis: Other abnormalities of gait and mobility  Unsteadiness on feet  Muscle weakness (generalized)     Problem List Patient Active Problem List   Diagnosis Date Noted  . Essential hypertension, benign 06/16/2012  . CAD (coronary artery disease) 05/27/2011  . Bladder cancer ([place]) 05/27/2011  . Osteopenia 05/27/2011  . SKIN CANCER, HX OF 02/28/2009  . COLONIC POLYPS, HX OF 02/28/2009  . ELEVATED PROSTATE SPECIFIC ANTIGEN 03/28/2008  . Hyperlipidemia 01/31/2008  . HYPERPLASIA PROSTATE UNS W/O UR OBST & OTH LUTS 01/31/2008  .  IMPOTENCE OF ORGANIC ORIGIN 01/31/2008  . Asthma 09/28/2007  . GERD 07/22/2007  . NEPHROLITHIASIS, HX OF 07/22/2007    Bjorn Loser, PTA  08/21/16, 11:51 AM [facility] [address] [place], [place], [address] Phone: [phone]   Fax:  [phone]  Name: [name] [name] MRN: [id number] Date of Birth: 1929/02/18

## 2016-08-22 ENCOUNTER — Ambulatory Visit: Payer: Medicare Other | Admitting: Physical Therapy

## 2016-08-22 DIAGNOSIS — R2681 Unsteadiness on feet: Secondary | ICD-10-CM

## 2016-08-22 DIAGNOSIS — R2689 Other abnormalities of gait and mobility: Secondary | ICD-10-CM

## 2016-08-22 DIAGNOSIS — M6281 Muscle weakness (generalized): Secondary | ICD-10-CM | POA: Diagnosis not present

## 2016-08-23 NOTE — Therapy (Signed)
[facility] [address] [place], [place], [address] Phone: [phone]   Fax:  [phone]  Physical Therapy Treatment  Patient Details  Name: [name] [name] MRN: [id number] Date of Birth: [birth year] Referring Provider: Kerin Salen, DO  Encounter Date: 08/22/2016      PT End of Session - 08/23/16 1559    Visit Number 8   Number of Visits 9   Date for PT Re-Evaluation 09/15/16   Authorization Type Medicare, BCBS:  GCODE every 10th visit   PT Start Time 1153   PT Stop Time 1223   PT Time Calculation (min) 30 min   Activity Tolerance Patient tolerated treatment well   Behavior During Therapy [facility] for tasks assessed/performed      Past Medical History:  Diagnosis Date  . Asthma    post infectious  . Bladder cancer [facility])    Dr Lindley Magnus, [facility]  . CAD (coronary artery disease)    a. s/p Cypher DES x 2 to prox LAD and Cypher DES x 1 to distal LAD 08/2009;  b. cath with residual D2 50% 10/05 and normal EF;  c. Myoview 6/11: EF 60%, mild fixed INF, inferoseptal, inferoapical thinning, no ischemia, low risk, EF 60%  . Colon polyps   . GERD (gastroesophageal reflux disease)   . HLD (hyperlipidemia)    h/o elevated LFTs on Zocor  . Nephrolithiasis   . Other abnormal glucose 2012   FBS 104  . Skin cancer, basal cell    Dr Londell Moh    Past Surgical History:  Procedure Laterality Date  . BLADDER TUMOR EXCISION  2011, 2012, 2013   Dr Lindley Magnus, [facility]  . CATARACT EXTRACTION  2003   Bilateral, [facility]  . Colonoscopy with polypectomy  2005   Dr. Sheryn Bison  . Coronary artery stenting  2005   Dr. Juanda Chance, 3 vessel  . LITHOTRIPSY  2002  . UPPER GASTROINTESTINAL ENDOSCOPY  2005   Esophageal reflux, hiatal hernia    There were no vitals filed for this visit.      Subjective Assessment - 08/23/16 1557    Subjective Feel like I am doing better with my balance.   Pertinent History bladder CA, Cardiac  Stents; hip bursitis   Limitations Standing;Walking;House hold activities;Other (comment)   How long can you sit comfortably? 30 min    How long can you stand comfortably? better than walking , about 60 min pain was minimal this past weekend   How long can you walk comfortably? walked about a mile, moderate pain 6/10 (walking up a hill)    Diagnostic tests XR 6-8 mos ago    Patient Stated Goals Pt wants to improve walking and balance.   Currently in Pain? No/denies   Pain Onset 1 to 4 weeks ago          Neuro re-ed: sensory organization test performed with following results: Conditions: 1:  WFL  2:  WFL 3:  WFL 4: WFL 5: WFL 6: WFL Composite score:  79 (improved from 32) Sensory Analysis Som:  WFL Vis:  WFL Vest:  WFL (improved from 25% to near 75% vestibular system use) Pref:  WFL    Self Care:  Discussed results of SOT, including improvements noted since last test.  Pt has improved on Conditions 5 and 6, with improved vestibular system use noted.  Discussed situations in real life similar to conditions 5 and 6 for pt's improved awareness in these situations.  Discussed importance  of continuing PT HEP after discharge to maintain balance gains.  Discussed progress in PT and plans for discharge-pt in agreement.                       PT Education - 08/23/16 1558    Education provided Yes   Education Details Discussed results of Sensory Organization test, plans for discharge.   Person(s) Educated Patient   Methods Explanation   Comprehension Verbalized understanding             PT Long Term Goals - 08/23/16 1559      PT LONG TERM GOAL #1   Title Pt will be independent with HEP to address balance and gait.  TARGET 08/16/16   Baseline Met, 08/21/16.   Time 4   Period Weeks   Status Achieved     PT LONG TERM GOAL #2   Title Sensory Organization test to be assessed, with goal to be written as appropriate.  GOAL:  Improve vestibular system performance  on SOT by at least 10%.-AWM   Time 4   Period Weeks   Status Achieved     PT LONG TERM GOAL #3   Title Pt will improve Functional Gait Assessment score to at least 22/30 for decreased fall risk.   Baseline FGA 24/30. Met. 08/21/16.   Time 4   Period Weeks   Status Achieved     PT LONG TERM GOAL #4   Title Pt will verbalize understanding of fall prevention in home/community environment.   Baseline Met, 08/21/16   Time 4   Period Weeks   Status Achieved               Plan - 08/23/16 1600    Clinical Impression Statement Pt has met LTG for improved vestibular system function on Sensory Organization test, with all conditions and sensory systems WNL.  Pt has demonstrated improvements in overall balance and is appropriate for discharge at this time.   Rehab Potential Good   PT Frequency 2x / week   PT Duration 4 weeks   PT Treatment/Interventions ADLs/Self Care Home Management;Functional mobility training;Gait training;Therapeutic activities;Therapeutic exercise;Balance training;Neuromuscular re-education;Patient/family education   PT Next Visit Plan Discharge this visit.  Pt did not bring HEP ex to 08/11/16 session   Consulted and Agree with Plan of Care Patient      Patient will benefit from skilled therapeutic intervention in order to improve the following deficits and impairments:  Abnormal gait, Decreased balance, Decreased mobility, Decreased strength, Difficulty walking  Visit Diagnosis: Other abnormalities of gait and mobility  Unsteadiness on feet       G-Codes - 2016-09-13 1602    Functional Assessment Tool Used Sensory Organization test [facility]; FGA>22/30   Functional Limitation Mobility: Walking and moving around   Mobility: Walking and Moving Around Goal Status (H0623) At least 1 percent but less than 20 percent impaired, limited or restricted   Mobility: Walking and Moving Around Discharge Status [phone]) At least 1 percent but less than 20 percent impaired,  limited or restricted      Problem List Patient Active Problem List   Diagnosis Date Noted  . Essential hypertension, benign 06/16/2012  . CAD (coronary artery disease) 05/27/2011  . Bladder cancer ([place]) 05/27/2011  . Osteopenia 05/27/2011  . SKIN CANCER, HX OF 02/28/2009  . COLONIC POLYPS, HX OF 02/28/2009  . ELEVATED PROSTATE SPECIFIC ANTIGEN 03/28/2008  . Hyperlipidemia 01/31/2008  . HYPERPLASIA PROSTATE UNS W/O UR OBST & OTH  LUTS 01/31/2008  . IMPOTENCE OF ORGANIC ORIGIN 01/31/2008  . Asthma 09/28/2007  . GERD 07/22/2007  . NEPHROLITHIASIS, HX OF 07/22/2007    Makinzee Durley W. 08/23/2016, 4:03 PM  Frazier Butt., PT  [facility] [address] [place], [place], [address] Phone: [phone]   Fax:  [phone]  Name: [name] [name] MRN: [id number] Date of Birth: 1929/07/25   PHYSICAL THERAPY DISCHARGE SUMMARY  Visits from Start of Care: 8  Current functional level related to goals / functional outcomes: Pt has met all LTGs-see above   Remaining deficits: Static standing balance   Education / Equipment: Pt has been educated in fall prevention and in HEP.  Plan: Patient agrees to discharge.  Patient goals were met. Patient is being discharged due to meeting the stated rehab goals.  ?????     Mady Haagensen, PT 08/23/16 4:08 PM Phone: [phone] Fax: [phone]

## 2016-09-17 DIAGNOSIS — D239 Other benign neoplasm of skin, unspecified: Secondary | ICD-10-CM | POA: Diagnosis not present

## 2016-09-17 DIAGNOSIS — Z961 Presence of intraocular lens: Secondary | ICD-10-CM | POA: Diagnosis not present

## 2016-09-26 ENCOUNTER — Telehealth: Payer: Self-pay | Admitting: Internal Medicine

## 2016-09-26 NOTE — Telephone Encounter (Signed)
Called [name] [name] to schedule awv. Left msg for patient to call office to schedule appt.

## 2016-09-30 NOTE — Telephone Encounter (Signed)
Scheduled patient for this Thursday 11/16

## 2016-10-01 NOTE — Progress Notes (Signed)
Pre visit review using our clinic review tool, if applicable. No additional management support is needed unless otherwise documented below in the visit note. 

## 2016-10-01 NOTE — Progress Notes (Addendum)
Subjective:   [name] [name] is a 82 y.o. male who presents for Medicare Annual/Subsequent preventive examination.  The Patient was informed that the wellness visit is to identify future health risk and educate and initiate measures that can reduce risk for increased disease through the lifespan.   Describes health as fair, good or great? "good"  [name] enjoys hikes, tennis, golf and walking when balance will allow.   Review of Systems:  No ROS.  Medicare Wellness Visit.  Cardiac Risk Factors include: advanced age (>6men, >68 women);male gender;hypertension;dyslipidemia;family history of premature cardiovascular disease   Sleep patterns:   Sleeps okay most of the time, does take Melatonin occasionally.  Home Safety/Smoke Alarms: Smoke detectors in place.  Living environment; residence and Firearm Safety: Lives with wife in 2 story home. Moving to retirement community/cottage in January 2018. Family lives close. Firearms locked away.  Seat Belt Safety/Bike Helmet: Wears seat belt   Counseling:   Eye Exam-Last exam 2 weeks ago, followed by [employer] every 4 months. Dental-Last exam 1 month ago, followed yearly.   Male:   CCS-colonoscopy 04/17/2004, diverticulosis.  No recall. PSA- Followed by urology (h/o bladder cancer). Dr. Estill Dooms.  Dexa-11/03/2006, osteopenia. Vit D supplement.      Objective:    Vitals: BP 122/68 (BP Location: Left Arm, Patient Position: Sitting, Cuff Size: Normal)   Pulse 68   Ht 5\' 6"  (1.676 m)   Wt 151 lb 1.3 oz (68.5 kg)   SpO2 98%   BMI 24.38 kg/m   Body mass index is 24.38 kg/m.  Tobacco History  Smoking Status  . Former Smoker  . Types: Cigarettes  . Quit date: 11/17/1960  Smokeless Tobacco  . Never Used    Comment: smoked 1948-1962, up to 1/2 ppd     Counseling given: Not Answered   Past Medical History:  Diagnosis Date  . Asthma    post infectious  . Bladder cancer [facility]    Dr Estill Dooms, [facility]  . CAD (coronary artery  disease)    a. s/p Cypher DES x 2 to prox LAD and Cypher DES x 1 to distal LAD 08/2009;  b. cath with residual D2 50% 10/05 and normal EF;  c. Myoview 6/11: EF 60%, mild fixed INF, inferoseptal, inferoapical thinning, no ischemia, low risk, EF 60%  . Colon polyps   . GERD (gastroesophageal reflux disease)   . HLD (hyperlipidemia)    h/o elevated LFTs on Zocor  . Nephrolithiasis   . Other abnormal glucose 2012   FBS 104  . Skin cancer, basal cell    Dr Sherrye Payor   Past Surgical History:  Procedure Laterality Date  . BLADDER TUMOR EXCISION  2011, 2012, 2013   Dr Estill Dooms, [facility]  . CATARACT EXTRACTION  2003   Bilateral, [facility]  . Colonoscopy with polypectomy  2005   Dr. Verl Blalock  . Coronary artery stenting  2005   Dr. Olevia Perches, 3 vessel  . LITHOTRIPSY  2002  . UPPER GASTROINTESTINAL ENDOSCOPY  2005   Esophageal reflux, hiatal hernia   Family History  Problem Relation Age of Onset  . Hypertension Mother   . Stroke Mother     in 46s  . Heart attack Mother     22  . Coronary artery disease Father     33s  . Heart disease Paternal Grandmother     in 43s  . Asthma Brother   . Diabetes Brother   . Alcohol abuse Brother   . Prostate  cancer Maternal Grandfather    History  Sexual Activity  . Sexual activity: Not on file    Outpatient Encounter Prescriptions as of 10/02/2016  Medication Sig  . amLODipine (NORVASC) 5 MG tablet TAKE 1 TABLET BY MOUTH EVERY DAY  . aspirin 81 MG tablet Take 81 mg by mouth daily.    . clopidogrel (PLAVIX) 75 MG tablet Take 1 tablet (75 mg total) by mouth daily.  . fexofenadine (ALLEGRA) 180 MG tablet Take 180 mg by mouth daily as needed for allergies or rhinitis.   . fluticasone (FLONASE) 50 MCG/ACT nasal spray PLACE TWO SPRAYS INTO THE NOSE AT BEDTIME Must keep 01/22/16 appt for future refills  . Fluticasone-Salmeterol (ADVAIR) 100-50 MCG/DOSE AEPB Inhale 1 puff into the lungs 2 (two) times daily as needed (sob or wheezing).  .  nitroGLYCERIN (NITROSTAT) 0.4 MG SL tablet Place 1 tablet (0.4 mg total) under the tongue every 5 (five) minutes as needed for chest pain.  . ranitidine (ZANTAC) 150 MG tablet TAKE 1 TABLET(150 MG) BY MOUTH TWICE DAILY  . rosuvastatin (CRESTOR) 5 MG tablet Take 1 tablet by mouth daily on Monday, Wednesday and Friday  . sildenafil (VIAGRA) 100 MG tablet Take 50 mg by mouth daily as needed for erectile dysfunction.  . Glycerin-Hypromellose-PEG 400 0.2-0.2-1 % SOLN Place 1 drop into both eyes as needed.  Marland Kitchen MYRBETRIQ 50 MG TB24 tablet Take 50 mg by mouth daily.   . sucralfate (CARAFATE) 1 g tablet TK 1 T PO TID WITH MEALS  . [DISCONTINUED] amLODipine (NORVASC) 5 MG tablet TAKE 1 TABLET BY MOUTH EVERY DAY   No facility-administered encounter medications on file as of 10/02/2016.     Activities of Daily Living In your present state of health, do you have any difficulty performing the following activities: 10/02/2016  Hearing? N  Vision? N  Difficulty concentrating or making decisions? N  Walking or climbing stairs? Y  Dressing or bathing? N  Doing errands, shopping? N  Preparing Food and eating ? N  Using the Toilet? N  In the past six months, have you accidently leaked urine? N  Do you have problems with loss of bowel control? N  Managing your Medications? N  Managing your Finances? N  Housekeeping or managing your Housekeeping? N  Some recent data might be hidden    Patient Care Team: Binnie Rail, MD as PCP - General (Internal Medicine) Marilynne Halsted, MD as Referring Physician (Ophthalmology) Ludwig Clarks, DO as Consulting Physician (Neurology) Sherren Mocha, MD as Consulting Physician (Cardiology) Christy Sartorius, MD as Referring Physician (Urology)   Assessment:    Physical assessment deferred to PCP.  Exercise Activities and Dietary recommendations Current Exercise Habits: Home exercise routine (exercises provided by physical therapist ), Type of exercise: strength  training/weights;stretching;walking, Time (Minutes): 30, Frequency (Times/Week): 6, Weekly Exercise (Minutes/Week): 180, Intensity: Mild, Exercise limited by: cardiac condition(s);neurologic condition(s)   Diet (meal preparation, eat out, water intake, caffeinated beverages, dairy products, fruits and vegetables): Eats out a lot. Hot tea and water to drink during the day.   Breakfast: yogurt, eggs, bacon, cereal, muffin, coffee (3/4 cup) Lunch: salad, sandwich, fruit Dinner: salmon, chicken, pork, vegetables     Encouraged to continue healthy food options. Discussed at home PT exercises to improve balance. Followed by Neuro.   Goals      Patient Stated   . <enter goal here> (pt-stated)          "to be able to run or dance". Will  increase activity and movement, work on balance.       Fall Risk Fall Risk  10/02/2016 01/17/2016 09/14/2015 12/27/2014 10/20/2013  Falls in the past year? No No No Yes No  Number falls in past yr: - - - 1 -  Injury with Fall? - - - No -  Risk for fall due to : Impaired balance/gait - - - -   Depression Screen PHQ 2/9 Scores 10/02/2016 01/17/2016 09/14/2015 12/27/2014  PHQ - 2 Score 0 0 0 2  PHQ- 9 Score - - - 4    Cognitive Function       Ad8 score reviewed for issues:  Issues making decisions:no  Less interest in hobbies / activities:no  Repeats questions, stories (family complaining):no  Trouble using ordinary gadgets (microwave, computer, phone):no  Forgets the month or year: no  Mismanaging finances: no  Remembering appts:no  Daily problems with thinking and/or memory:no Ad8 score is=0     Immunization History  Administered Date(s) Administered  . Influenza Split 09/30/2012  . Influenza Whole 09/14/2007, 08/23/2008, 08/14/2010  . Influenza, High Dose Seasonal PF 09/14/2013, 09/14/2015  . Influenza,inj,Quad PF,36+ Mos 10/10/2014  . Pneumococcal Conjugate-13 01/17/2016  . Pneumococcal Polysaccharide-23 09/12/1931, 11/01/2012  .  Td 05/08/2009  . Zoster 09/11/2009, 08/21/2010    High Dose Flu Vaccine administered today.   Screening Tests Health Maintenance  Topic Date Due  . INFLUENZA VACCINE  06/17/2016  . TETANUS/TDAP  05/09/2019  . ZOSTAVAX  Completed  . PNA vac Low Risk Adult  Completed      Plan:     Continue to eat heart healthy diet (full of fruits, vegetables, whole grains, lean protein, water--limit salt, fat, and sugar intake) and increase physical activity as tolerated.  Continue doing brain stimulating activities (puzzles, reading, adult coloring books, staying active) to keep memory sharp.   Bring a copy of your advanced directives to your next office visit.  Call Dr. Almeta Monas office for follow up on Physical Therapy.    During the course of the visit the patient was educated and counseled about the following appropriate screening and preventive services:   Vaccines to include Pneumoccal, Influenza, Hepatitis B, Td, Zostavax, HCV  Cardiovascular Disease  Colorectal cancer screening  Diabetes screening  Prostate Cancer Screening  Glaucoma screening  Nutrition counseling   Patient Instructions (the written plan) was given to the patient.    Gerilyn Nestle, RN  10/02/2016     Medical screening examination/treatment/procedure(s) were performed by non-physician practitioner and as supervising physician I was immediately available for consultation/collaboration. I agree with above. Binnie Rail, MD

## 2016-10-02 ENCOUNTER — Ambulatory Visit (INDEPENDENT_AMBULATORY_CARE_PROVIDER_SITE_OTHER): Payer: Medicare Other

## 2016-10-02 VITALS — BP 122/68 | HR 68 | Ht 66.0 in | Wt 151.1 lb

## 2016-10-02 DIAGNOSIS — Z0000 Encounter for general adult medical examination without abnormal findings: Secondary | ICD-10-CM

## 2016-10-02 DIAGNOSIS — Z23 Encounter for immunization: Secondary | ICD-10-CM | POA: Diagnosis not present

## 2016-10-02 NOTE — Patient Instructions (Addendum)
Continue to eat heart healthy diet (full of fruits, vegetables, whole grains, lean protein, water--limit salt, fat, and sugar intake) and increase physical activity as tolerated.  Continue doing brain stimulating activities (puzzles, reading, adult coloring books, staying active) to keep memory sharp.   Bring a copy of your advanced directives to your next office visit.  Call Dr. Almeta Monas office for follow up.    Fall Prevention in the Home Introduction Falls can cause injuries. They can happen to people of all ages. There are many things you can do to make your home safe and to help prevent falls. What can I do on the outside of my home?  Regularly fix the edges of walkways and driveways and fix any cracks.  Remove anything that might make you trip as you walk through a door, such as a raised step or threshold.  Trim any bushes or trees on the path to your home.  Use bright outdoor lighting.  Clear any walking paths of anything that might make someone trip, such as rocks or tools.  Regularly check to see if handrails are loose or broken. Make sure that both sides of any steps have handrails.  Any raised decks and porches should have guardrails on the edges.  Have any leaves, snow, or ice cleared regularly.  Use sand or salt on walking paths during winter.  Clean up any spills in your garage right away. This includes oil or grease spills. What can I do in the bathroom?  Use night lights.  Install grab bars by the toilet and in the tub and shower. Do not use towel bars as grab bars.  Use non-skid mats or decals in the tub or shower.  If you need to sit down in the shower, use a plastic, non-slip stool.  Keep the floor dry. Clean up any water that spills on the floor as soon as it happens.  Remove soap buildup in the tub or shower regularly.  Attach bath mats securely with double-sided non-slip rug tape.  Do not have throw rugs and other things on the floor that can make  you trip. What can I do in the bedroom?  Use night lights.  Make sure that you have a light by your bed that is easy to reach.  Do not use any sheets or blankets that are too big for your bed. They should not hang down onto the floor.  Have a firm chair that has side arms. You can use this for support while you get dressed.  Do not have throw rugs and other things on the floor that can make you trip. What can I do in the kitchen?  Clean up any spills right away.  Avoid walking on wet floors.  Keep items that you use a lot in easy-to-reach places.  If you need to reach something above you, use a strong step stool that has a grab bar.  Keep electrical cords out of the way.  Do not use floor polish or wax that makes floors slippery. If you must use wax, use non-skid floor wax.  Do not have throw rugs and other things on the floor that can make you trip. What can I do with my stairs?  Do not leave any items on the stairs.  Make sure that there are handrails on both sides of the stairs and use them. Fix handrails that are broken or loose. Make sure that handrails are as long as the stairways.  Check any carpeting to  make sure that it is firmly attached to the stairs. Fix any carpet that is loose or worn.  Avoid having throw rugs at the top or bottom of the stairs. If you do have throw rugs, attach them to the floor with carpet tape.  Make sure that you have a light switch at the top of the stairs and the bottom of the stairs. If you do not have them, ask someone to add them for you. What else can I do to help prevent falls?  Wear shoes that:  Do not have high heels.  Have rubber bottoms.  Are comfortable and fit you well.  Are closed at the toe. Do not wear sandals.  If you use a stepladder:  Make sure that it is fully opened. Do not climb a closed stepladder.  Make sure that both sides of the stepladder are locked into place.  Ask someone to hold it for you, if  possible.  Clearly mark and make sure that you can see:  Any grab bars or handrails.  First and last steps.  Where the edge of each step is.  Use tools that help you move around (mobility aids) if they are needed. These include:  Canes.  Walkers.  Scooters.  Crutches.  Turn on the lights when you go into a dark area. Replace any light bulbs as soon as they burn out.  Set up your furniture so you have a clear path. Avoid moving your furniture around.  If any of your floors are uneven, fix them.  If there are any pets around you, be aware of where they are.  Review your medicines with your doctor. Some medicines can make you feel dizzy. This can increase your chance of falling. Ask your doctor what other things that you can do to help prevent falls. This information is not intended to replace advice given to you by your health care provider. Make sure you discuss any questions you have with your health care provider. Document Released: 08/30/2009 Document Revised: 04/10/2016 Document Reviewed: 12/08/2014  2017 Elsevier  Health Maintenance, Male A healthy lifestyle and preventative care can promote health and wellness.  Maintain regular health, dental, and eye exams.  Eat a healthy diet. Foods like vegetables, fruits, whole grains, low-fat dairy products, and lean protein foods contain the nutrients you need and are low in calories. Decrease your intake of foods high in solid fats, added sugars, and salt. Get information about a proper diet from your health care provider, if necessary.  Regular physical exercise is one of the most important things you can do for your health. Most adults should get at least 150 minutes of moderate-intensity exercise (any activity that increases your heart rate and causes you to sweat) each week. In addition, most adults need muscle-strengthening exercises on 2 or more days a week.   Maintain a healthy weight. The body mass index (BMI) is a  screening tool to identify possible weight problems. It provides an estimate of body fat based on height and weight. Your health care provider can find your BMI and can help you achieve or maintain a healthy weight. For males 20 years and older:  A BMI below 18.5 is considered underweight.  A BMI of 18.5 to 24.9 is normal.  A BMI of 25 to 29.9 is considered overweight.  A BMI of 30 and above is considered obese.  Maintain normal blood lipids and cholesterol by exercising and minimizing your intake of saturated fat. Eat a balanced  diet with plenty of fruits and vegetables. Blood tests for lipids and cholesterol should begin at age 66 and be repeated every 5 years. If your lipid or cholesterol levels are high, you are over age 21, or you are at high risk for heart disease, you may need your cholesterol levels checked more frequently.Ongoing high lipid and cholesterol levels should be treated with medicines if diet and exercise are not working.  If you smoke, find out from your health care provider how to quit. If you do not use tobacco, do not start.  Lung cancer screening is recommended for adults aged 4-80 years who are at high risk for developing lung cancer because of a history of smoking. A yearly low-dose CT scan of the lungs is recommended for people who have at least a 30-pack-year history of smoking and are current smokers or have quit within the past 15 years. A pack year of smoking is smoking an average of 1 pack of cigarettes a day for 1 year (for example, a 30-pack-year history of smoking could mean smoking 1 pack a day for 30 years or 2 packs a day for 15 years). Yearly screening should continue until the smoker has stopped smoking for at least 15 years. Yearly screening should be stopped for people who develop a health problem that would prevent them from having lung cancer treatment.  If you choose to drink alcohol, do not have more than 2 drinks per day. One drink is considered to be  12 oz (360 mL) of beer, 5 oz (150 mL) of wine, or 1.5 oz (45 mL) of liquor.  Avoid the use of street drugs. Do not share needles with anyone. Ask for help if you need support or instructions about stopping the use of drugs.  High blood pressure causes heart disease and increases the risk of stroke. High blood pressure is more likely to develop in:  People who have blood pressure in the end of the normal range (100-139/85-89 mm Hg).  People who are overweight or obese.  People who are African American.  If you are 37-30 years of age, have your blood pressure checked every 3-5 years. If you are 35 years of age or older, have your blood pressure checked every year. You should have your blood pressure measured twice-once when you are at a hospital or clinic, and once when you are not at a hospital or clinic. Record the average of the two measurements. To check your blood pressure when you are not at a hospital or clinic, you can use:  An automated blood pressure machine at a pharmacy.  A home blood pressure monitor.  If you are 31-66 years old, ask your health care provider if you should take aspirin to prevent heart disease.  Diabetes screening involves taking a blood sample to check your fasting blood sugar level. This should be done once every 3 years after age 33 if you are at a normal weight and without risk factors for diabetes. Testing should be considered at a younger age or be carried out more frequently if you are overweight and have at least 1 risk factor for diabetes.  Colorectal cancer can be detected and often prevented. Most routine colorectal cancer screening begins at the age of 81 and continues through age 25. However, your health care provider may recommend screening at an earlier age if you have risk factors for colon cancer. On a yearly basis, your health care provider may provide home test kits to check  for hidden blood in the stool. A small camera at the end of a tube may be  used to directly examine the colon (sigmoidoscopy or colonoscopy) to detect the earliest forms of colorectal cancer. Talk to your health care provider about this at age 71 when routine screening begins. A direct exam of the colon should be repeated every 5-10 years through age 76, unless early forms of precancerous polyps or small growths are found.  People who are at an increased risk for hepatitis B should be screened for this virus. You are considered at high risk for hepatitis B if:  You were born in a country where hepatitis B occurs often. Talk with your health care provider about which countries are considered high risk.  Your parents were born in a high-risk country and you have not received a shot to protect against hepatitis B (hepatitis B vaccine).  You have HIV or AIDS.  You use needles to inject street drugs.  You live with, or have sex with, someone who has hepatitis B.  You are a man who has sex with other men (MSM).  You get hemodialysis treatment.  You take certain medicines for conditions like cancer, organ transplantation, and autoimmune conditions.  Hepatitis C blood testing is recommended for all people born from [birth year] through 1963 and any individual with known risk factors for hepatitis C.  Healthy men should no longer receive prostate-specific antigen (PSA) blood tests as part of routine cancer screening. Talk to your health care provider about prostate cancer screening.  Testicular cancer screening is not recommended for adolescents or adult males who have no symptoms. Screening includes self-exam, a health care provider exam, and other screening tests. Consult with your health care provider about any symptoms you have or any concerns you have about testicular cancer.  Practice safe sex. Use condoms and avoid high-risk sexual practices to reduce the spread of sexually transmitted infections (STIs).  You should be screened for STIs, including gonorrhea and chlamydia  if:  You are sexually active and are younger than 24 years.  You are older than 24 years, and your health care provider tells you that you are at risk for this type of infection.  Your sexual activity has changed since you were last screened, and you are at an increased risk for chlamydia or gonorrhea. Ask your health care provider if you are at risk.  If you are at risk of being infected with HIV, it is recommended that you take a prescription medicine daily to prevent HIV infection. This is called pre-exposure prophylaxis (PrEP). You are considered at risk if:  You are a man who has sex with other men (MSM).  You are a heterosexual man who is sexually active with multiple partners.  You take drugs by injection.  You are sexually active with a partner who has HIV.  Talk with your health care provider about whether you are at high risk of being infected with HIV. If you choose to begin PrEP, you should first be tested for HIV. You should then be tested every 3 months for as long as you are taking PrEP.  Use sunscreen. Apply sunscreen liberally and repeatedly throughout the day. You should seek shade when your shadow is shorter than you. Protect yourself by wearing long sleeves, pants, a wide-brimmed hat, and sunglasses year round whenever you are outdoors.  Tell your health care provider of new moles or changes in moles, especially if there is a change in shape or color.  Also, tell your health care provider if a mole is larger than the size of a pencil eraser.  A one-time screening for abdominal aortic aneurysm (AAA) and surgical repair of large AAAs by ultrasound is recommended for men aged 49-75 years who are current or former smokers.  Stay current with your vaccines (immunizations). This information is not intended to replace advice given to you by your health care provider. Make sure you discuss any questions you have with your health care provider. Document Released: 05/01/2008  Document Revised: 11/24/2014 Document Reviewed: 08/07/2015 Elsevier Interactive Patient Education  2017 [employer].

## 2016-11-28 DIAGNOSIS — H6123 Impacted cerumen, bilateral: Secondary | ICD-10-CM | POA: Diagnosis not present

## 2016-12-08 ENCOUNTER — Other Ambulatory Visit: Payer: Self-pay | Admitting: Physician Assistant

## 2016-12-08 ENCOUNTER — Other Ambulatory Visit: Payer: Self-pay | Admitting: Internal Medicine

## 2016-12-09 NOTE — Telephone Encounter (Signed)
Rx refill sent to pharmacy. 

## 2016-12-22 DIAGNOSIS — C679 Malignant neoplasm of bladder, unspecified: Secondary | ICD-10-CM | POA: Diagnosis not present

## 2017-01-31 ENCOUNTER — Encounter: Payer: Self-pay | Admitting: Family Medicine

## 2017-01-31 ENCOUNTER — Ambulatory Visit (INDEPENDENT_AMBULATORY_CARE_PROVIDER_SITE_OTHER): Payer: Medicare Other | Admitting: Family Medicine

## 2017-01-31 VITALS — BP 142/76 | HR 61 | Temp 97.7°F | Ht 66.0 in | Wt 149.5 lb

## 2017-01-31 DIAGNOSIS — R21 Rash and other nonspecific skin eruption: Secondary | ICD-10-CM

## 2017-01-31 MED ORDER — CLOTRIMAZOLE 1 % EX CREA: 1.0000 "application " | TOPICAL_CREAM | Freq: Two times a day (BID) | CUTANEOUS | 0 refills | Status: DC

## 2017-01-31 NOTE — Progress Notes (Signed)
[name] [name] , Jun 18, 1929, 83 y.o., male MRN: [id number] Patient Care Team    Relationship Specialty Notifications Start End  Binnie Rail, MD PCP - General Internal Medicine  01/17/16   Marilynne Halsted, MD Referring Physician Ophthalmology  10/02/16   Ludwig Clarks, DO Consulting Physician Neurology  10/02/16   Sherren Mocha, MD Consulting Physician Cardiology  10/02/16   Christy Sartorius, MD Referring Physician Urology  10/02/16     CC: right ankle pain Subjective: Pt presents for an OV with complaints of right ankle discomfort of 3 weeks duration.  Associated symptoms include recent injury to the area after slipping and hitting it on concrete. He reports he had a shallow laceration about 1-2 inches long in his skin from injury. He was putting an ointment (cortisone) over it and a band-aid. He does endorse mild swelling in his ankle after walking on it. He endorses itching, burning sensation over the area and has been scratching at it in the middle of the night.  He states he is able to walk on it without difficulty. He denies fever, chills, nausea. He denies drainage or seepage of the area of concern. He denies numbness, tingling or changes distally to area.    Depression screen [facility] 2/9 10/02/2016 01/17/2016 09/14/2015 12/27/2014 10/20/2013  Decreased Interest 0 0 0 1 0  Down, Depressed, Hopeless 0 0 0 1 0  PHQ - 2 Score 0 0 0 2 0  Altered sleeping - - - 0 -  Tired, decreased energy - - - 1 -  Change in appetite - - - 0 -  Feeling bad or failure about yourself  - - - 1 -  Trouble concentrating - - - 0 -  Moving slowly or fidgety/restless - - - 0 -  Suicidal thoughts - - - 0 -  PHQ-9 Score - - - 4 -    Allergies  Allergen Reactions  . Hydromorphone Hcl      Dilaudid Rxed for renal calculi caused angioedema   . Phenytoin Swelling  . Zocor [Simvastatin - High Dose]     Elevated hepatic enzymes  . Crestor [Rosuvastatin Calcium] Other (See Comments)    Leg pain. Not as bad  since on a lower dose  . Hydromorphone Hcl      Dilaudid Rxed for renal calculi caused angioedema  . Statins     Leg pain   Social History  Substance Use Topics  . Smoking status: Former Smoker    Types: Cigarettes    Quit date: 11/17/1960  . Smokeless tobacco: Never Used     Comment: smoked 1948-1962, up to 1/2 ppd  . Alcohol use 6.0 oz/week    10 Glasses of wine per week     Comment: wine @ dinner   Past Medical History:  Diagnosis Date  . Asthma    post infectious  . Bladder cancer [facility])    Dr Estill Dooms, [facility]  . CAD (coronary artery disease)    a. s/p Cypher DES x 2 to prox LAD and Cypher DES x 1 to distal LAD 08/2009;  b. cath with residual D2 50% 10/05 and normal EF;  c. Myoview 6/11: EF 60%, mild fixed INF, inferoseptal, inferoapical thinning, no ischemia, low risk, EF 60%  . Colon polyps   . GERD (gastroesophageal reflux disease)   . HLD (hyperlipidemia)    h/o elevated LFTs on Zocor  . Nephrolithiasis   . Other abnormal glucose 2012   FBS 104  .  Skin cancer, basal cell    Dr Sherrye Payor   Past Surgical History:  Procedure Laterality Date  . BLADDER TUMOR EXCISION  2011, 2012, 2013   Dr Estill Dooms, [facility]  . CATARACT EXTRACTION  2003   Bilateral, [facility]  . Colonoscopy with polypectomy  2005   Dr. Verl Blalock  . Coronary artery stenting  2005   Dr. Olevia Perches, 3 vessel  . LITHOTRIPSY  2002  . UPPER GASTROINTESTINAL ENDOSCOPY  2005   Esophageal reflux, hiatal hernia   Family History  Problem Relation Age of Onset  . Hypertension Mother   . Stroke Mother     in 74s  . Heart attack Mother     21  . Coronary artery disease Father     54s  . Heart disease Paternal Grandmother     in 107s  . Asthma Brother   . Diabetes Brother   . Alcohol abuse Brother   . Prostate cancer Maternal Grandfather    Allergies as of 01/31/2017      Reactions   Hydromorphone Hcl     Dilaudid Rxed for renal calculi caused angioedema   Phenytoin Swelling   Zocor [simvastatin  - High Dose]    Elevated hepatic enzymes   Crestor [rosuvastatin Calcium] Other (See Comments)   Leg pain. Not as bad since on a lower dose   Hydromorphone Hcl     Dilaudid Rxed for renal calculi caused angioedema   Statins    Leg pain      Medication List       Accurate as of 01/31/17 12:47 PM. Always use your most recent med list.          amLODipine 5 MG tablet Commonly known as:  NORVASC TAKE 1 TABLET BY MOUTH EVERY DAY   aspirin 81 MG tablet Take 81 mg by mouth daily.   clopidogrel 75 MG tablet Commonly known as:  PLAVIX Take 1 tablet (75 mg total) by mouth daily.   fexofenadine 180 MG tablet Commonly known as:  ALLEGRA Take 180 mg by mouth daily as needed for allergies or rhinitis.   fluticasone 50 MCG/ACT nasal spray Commonly known as:  FLONASE Place 2 sprays into both nostrils daily. --- Office visit needed for further refills   Fluticasone-Salmeterol 100-50 MCG/DOSE Aepb Commonly known as:  ADVAIR Inhale 1 puff into the lungs 2 (two) times daily as needed (sob or wheezing).   Glycerin-Hypromellose-PEG 400 0.2-0.2-1 % Soln Place 1 drop into both eyes as needed.   MYRBETRIQ 50 MG Tb24 tablet Generic drug:  mirabegron ER Take 50 mg by mouth daily.   nitroGLYCERIN 0.4 MG SL tablet Commonly known as:  NITROSTAT Place 1 tablet (0.4 mg total) under the tongue every 5 (five) minutes as needed for chest pain.   ranitidine 150 MG tablet Commonly known as:  ZANTAC TAKE 1 TABLET(150 MG) BY MOUTH TWICE DAILY   rosuvastatin 5 MG tablet Commonly known as:  CRESTOR TAKE 1 TABLET BY MOUTH DAILY ON MONDAY, WEDNESDAY, AND FRIDAY   sildenafil 100 MG tablet Commonly known as:  VIAGRA Take 50 mg by mouth daily as needed for erectile dysfunction.   sucralfate 1 g tablet Commonly known as:  CARAFATE TK 1 T PO TID WITH MEALS       No results found for this or any previous visit (from the past 24 hour(s)). No results found.   ROS: Negative, with the exception  of above mentioned in HPI   Objective:  BP Marland Kitchen)  142/76 (BP Location: Left Arm, Patient Position: Sitting, Cuff Size: Normal)   Pulse 61   Temp 97.7 F (36.5 C) (Oral)   Ht 5\' 6"  (1.676 m)   Wt 149 lb 8 oz (67.8 kg)   SpO2 97%   BMI 24.13 kg/m  Body mass index is 24.13 kg/m. Gen: Afebrile. No acute distress. Nontoxic in appearance, well developed, well nourished. Very pleasant caucasian male.  HENT: AT. [place]. MMM, no oral lesions. Eyes:Pupils Equal Round Reactive to light, Extraocular movements intact,  Conjunctiva without redness, discharge or icterus. Right ankle: Mild swelling of right ankle, Dry flaky skin. erythremic rash with defined border surrounding well healed ~2 in horizontal scar medial right ankle superior to medial malleolus. No TTP. Areas of excoriation noted. No drainage. No abscess formation. NV intact distally. Pulses equal bilateral LE.  Neuro: Normal gait. Alert. Oriented x3   Assessment/Plan: LYNDA CAPISTRAN is a 83 y.o. male present for OV for  Rash and nonspecific skin eruption - Area does not appear to be a wound infection from injury. There is no tenderness to suggest he sustained bone trauma/fracture. He has no systemic symptoms. This appears to be a local reaction from injury or possibly the ?cortisone cream and adhesive from band-aid?; Considered fungal infection.  - he does have some mild swelling surrounding ankle, more than other ankle. He reports this is normal when he wakes up. I have asked him to place light compression over area during waking hours.  - advised pt to stop cream he had been using, start clotrimazole over the area only has directed.  - clotrimazole (LOTRIMIN) 1 % cream; Apply 1 application topically 2 (two) times daily.  Dispense: 30 g; Refill: 0 - F/U PCP within a week for recheck, sooner if symptoms worsening fever, drainage, fatigue, shortness of breath, redness spreads etc   Reviewed expectations re: course of current medical  issues.  Discussed self-management of symptoms.  Outlined signs and symptoms indicating need for more acute intervention.  Patient verbalized understanding and all questions were answered.  Patient received an After-Visit Summary.   electronically signed by:  Howard Pouch, DO  [place]

## 2017-01-31 NOTE — Progress Notes (Signed)
Pre visit review using our clinic review tool, if applicable. No additional management support is needed unless otherwise documented below in the visit note. 

## 2017-01-31 NOTE — Patient Instructions (Signed)
It was a pleasure meeting you.  I have called in cream for you to apply on the area as the label directs.  Keep a light compression on the area with an ACE during active hours.   Follow up with PCP in 1 week, sooner if worsening.

## 2017-02-03 ENCOUNTER — Other Ambulatory Visit: Payer: Self-pay | Admitting: Internal Medicine

## 2017-02-12 ENCOUNTER — Ambulatory Visit (INDEPENDENT_AMBULATORY_CARE_PROVIDER_SITE_OTHER): Payer: Medicare Other | Admitting: Internal Medicine

## 2017-02-12 ENCOUNTER — Encounter: Payer: Self-pay | Admitting: Internal Medicine

## 2017-02-12 DIAGNOSIS — M25471 Effusion, right ankle: Secondary | ICD-10-CM

## 2017-02-12 DIAGNOSIS — R21 Rash and other nonspecific skin eruption: Secondary | ICD-10-CM | POA: Diagnosis not present

## 2017-02-12 MED ORDER — TRIAMCINOLONE ACETONIDE 0.5 % EX OINT: 1.0000 "application " | TOPICAL_OINTMENT | Freq: Two times a day (BID) | CUTANEOUS | 0 refills | Status: DC

## 2017-02-12 NOTE — Progress Notes (Signed)
Pre visit review using our clinic review tool, if applicable. No additional management support is needed unless otherwise documented below in the visit note. 

## 2017-02-12 NOTE — Patient Instructions (Signed)
Apply the steroid cream twice daily.   Continue to apply moisturizing cream to your leg as much as possible.

## 2017-02-12 NOTE — Assessment & Plan Note (Signed)
?   Allergic, eczema Continue moisturizing cream Trial of triamcinolone cream bid x 14 days  If no improvement - will see derm

## 2017-02-12 NOTE — Assessment & Plan Note (Addendum)
Likely related to recent ankle sprain No-minimal pain He feels the swelling has gotten better - he is on his feet most of the day - walks 1-2 miles daily  Can try compression Follow up if no improvement-

## 2017-02-12 NOTE — Progress Notes (Signed)
Subjective:    Patient ID: [name] [name], male    DOB: Mar 09, 1929, 83 y.o.   MRN: [id number]  HPI He is here for follow up of right ankle pain.  He was seen 01/31/17 at our Saturday clinic for this.  The pain started approximately one month ago after he slipped and hit it on concrete.  He did sustain a laceration 1-2 inches long. He thinks he sprained his ankle and it was sore for several days, but that has resolved.  He was applying otc hydrocortisone cream initially. He did have swelling after walking on it.  He was able to walk on it w/o difficulty.  He was scratching the area at night.  He had a rash surrounding the scar.  When he was seen it did not look like cellulitis.  It was possibly a reaction to the cortisone cream or adhesive or a fungal infection.  He was prescribed clotrimazole.  He thinks that helped, but he still has a rash and it itches.  He has very dry skin and is using moisturizing cream. He scratches the area at night in his sleep. The ankle still swells but is better.   Medications and allergies reviewed with patient and updated if appropriate.  Patient Active Problem List   Diagnosis Date Noted  . Essential hypertension, benign 06/16/2012  . CAD (coronary artery disease) 05/27/2011  . Bladder cancer ([place]) 05/27/2011  . Osteopenia 05/27/2011  . SKIN CANCER, HX OF 02/28/2009  . COLONIC POLYPS, HX OF 02/28/2009  . ELEVATED PROSTATE SPECIFIC ANTIGEN 03/28/2008  . Hyperlipidemia 01/31/2008  . HYPERPLASIA PROSTATE UNS W/O UR OBST & OTH LUTS 01/31/2008  . IMPOTENCE OF ORGANIC ORIGIN 01/31/2008  . Asthma 09/28/2007  . GERD 07/22/2007  . NEPHROLITHIASIS, HX OF 07/22/2007    Current Outpatient Prescriptions on File Prior to Visit  Medication Sig Dispense Refill  . amLODipine (NORVASC) 5 MG tablet TAKE 1 TABLET BY MOUTH EVERY DAY 30 tablet 0  . aspirin 81 MG tablet Take 81 mg by mouth daily.      . clopidogrel (PLAVIX) 75 MG tablet Take 1 tablet (75 mg total) by  mouth daily. 90 tablet 3  . clotrimazole (LOTRIMIN) 1 % cream Apply 1 application topically 2 (two) times daily. 30 g 0  . fexofenadine (ALLEGRA) 180 MG tablet Take 180 mg by mouth daily as needed for allergies or rhinitis.     . fluticasone (FLONASE) 50 MCG/ACT nasal spray Place 2 sprays into both nostrils daily. --- Office visit needed for further refills 16 g 0  . Fluticasone-Salmeterol (ADVAIR) 100-50 MCG/DOSE AEPB Inhale 1 puff into the lungs 2 (two) times daily as needed (sob or wheezing).    . Glycerin-Hypromellose-PEG 400 0.2-0.2-1 % SOLN Place 1 drop into both eyes as needed.    Marland Kitchen MYRBETRIQ 50 MG TB24 tablet Take 50 mg by mouth daily.     . nitroGLYCERIN (NITROSTAT) 0.4 MG SL tablet Place 1 tablet (0.4 mg total) under the tongue every 5 (five) minutes as needed for chest pain. 25 tablet 3  . ranitidine (ZANTAC) 150 MG tablet TAKE 1 TABLET(150 MG) BY MOUTH TWICE DAILY 60 tablet 0  . rosuvastatin (CRESTOR) 5 MG tablet TAKE 1 TABLET BY MOUTH DAILY ON MONDAY, WEDNESDAY, AND FRIDAY 45 tablet 0  . sildenafil (VIAGRA) 100 MG tablet Take 50 mg by mouth daily as needed for erectile dysfunction.    . sucralfate (CARAFATE) 1 g tablet TK 1 T PO TID WITH MEALS  0   No current facility-administered medications on file prior to visit.     Past Medical History:  Diagnosis Date  . Asthma    post infectious  . Bladder cancer [facility])    Dr Estill Dooms, [facility]  . CAD (coronary artery disease)    a. s/p Cypher DES x 2 to prox LAD and Cypher DES x 1 to distal LAD 08/2009;  b. cath with residual D2 50% 10/05 and normal EF;  c. Myoview 6/11: EF 60%, mild fixed INF, inferoseptal, inferoapical thinning, no ischemia, low risk, EF 60%  . Colon polyps   . GERD (gastroesophageal reflux disease)   . HLD (hyperlipidemia)    h/o elevated LFTs on Zocor  . Nephrolithiasis   . Other abnormal glucose 2012   FBS 104  . Skin cancer, basal cell    Dr Sherrye Payor    Past Surgical History:  Procedure Laterality Date  .  BLADDER TUMOR EXCISION  2011, 2012, 2013   Dr Estill Dooms, [facility]  . CATARACT EXTRACTION  2003   Bilateral, [facility]  . Colonoscopy with polypectomy  2005   Dr. Verl Blalock  . Coronary artery stenting  2005   Dr. Olevia Perches, 3 vessel  . LITHOTRIPSY  2002  . UPPER GASTROINTESTINAL ENDOSCOPY  2005   Esophageal reflux, hiatal hernia    Social History   Social History  . Marital status: Married    Spouse name: N/A  . Number of children: N/A  . Years of education: N/A   Social History Main Topics  . Smoking status: Former Smoker    Types: Cigarettes    Quit date: 11/17/1960  . Smokeless tobacco: Never Used     Comment: smoked 1948-1962, up to 1/2 ppd  . Alcohol use 6.0 oz/week    10 Glasses of wine per week     Comment: wine @ dinner  . Drug use: No  . Sexual activity: Not on file   Other Topics Concern  . Not on file   Social History Narrative  . No narrative on file    Family History  Problem Relation Age of Onset  . Hypertension Mother   . Stroke Mother     in 78s  . Heart attack Mother     24  . Coronary artery disease Father     59s  . Heart disease Paternal Grandmother     in 42s  . Asthma Brother   . Diabetes Brother   . Alcohol abuse Brother   . Prostate cancer Maternal Grandfather     Review of Systems  Constitutional: Negative for chills and fever.  Musculoskeletal: Positive for joint swelling (right ankle).  Skin: Positive for color change and rash. Negative for wound.  Neurological: Negative for light-headedness and headaches.       Objective:   Vitals:   02/12/17 1111  BP: (!) 162/68  Pulse: 65  Resp: 16  Temp: 97.9 F (36.6 C)   Filed Weights   02/12/17 1111  Weight: 150 lb (68 kg)   Body mass index is 24.21 kg/m.  Wt Readings from Last 3 Encounters:  02/12/17 150 lb (68 kg)  01/31/17 149 lb 8 oz (67.8 kg)  10/02/16 151 lb 1.3 oz (68.5 kg)     Physical Exam  Constitutional: He appears well-developed and well-nourished. No  distress.  Musculoskeletal:  Mild edema right ankle.  No pain with palpation right ankle, FROM of ankle  Skin: Skin is warm and dry. Rash (right medial  ankle - raised erythematous rash the size of an oblong orange, with dry skin and scratch marks, no open wound/laceration) noted. He is not diaphoretic.          Assessment & Plan:   See Problem List for Assessment and Plan of chronic medical problems.

## 2017-02-19 DIAGNOSIS — Z0289 Encounter for other administrative examinations: Secondary | ICD-10-CM

## 2017-03-09 ENCOUNTER — Encounter: Payer: Self-pay | Admitting: Physician Assistant

## 2017-03-09 NOTE — Progress Notes (Signed)
Cardiology Office Note    Date:  03/10/2017   ID:  [name] [name], DOB 1929/11/15, MRN [id number]  PCP:  Binnie Rail, MD  Cardiologist:  Dr. Burt Knack  Chief Complaint: YEARLY FOLLOW UP FOR CAD  History of Present Illness:   [name] [name] is a 83 y.o. male with hx of CAD, HLD, asthma, HTN and GERD presents for follow up.   The patient presented with unstable angina in 2005 and was treated with overlapping drug-eluting stents in the proximal LAD and a third stent in the distal LAD. He had no significant disease in his left circumflex or right coronary artery.  Last seen by Dr. Burt Knack 03/2016. Exercise Myoview was done for symptoms concerning for atypical again which was normal study.   Here today for follow up. Patient tripped over and injured his right leg approximately 3 weeks ago. He had bleeding. Was seen by PCP and started on Kenalog and pain for morning sensation. It helped initially when he was using it. He has a swelling since then with bruising with burning sensation. He denies any pain with walking. Patient denies any chest pain, shortness of breath, orthopnea, PND, palpitation, but melena or blood in his stool or urine.  Past Medical History:  Diagnosis Date  . Asthma    post infectious  . Bladder cancer [facility])    Dr Estill Dooms, [facility]  . CAD (coronary artery disease)    a. s/p Cypher DES x 2 to prox LAD and Cypher DES x 1 to distal LAD 08/2009;  b. cath with residual D2 50% 10/05 and normal EF;  c. Myoview 6/11: EF 60%, mild fixed INF, inferoseptal, inferoapical thinning, no ischemia, low risk, EF 60%  . Colon polyps   . GERD (gastroesophageal reflux disease)   . HLD (hyperlipidemia)    h/o elevated LFTs on Zocor  . Nephrolithiasis   . Other abnormal glucose 2012   FBS 104  . Skin cancer, basal cell    Dr Sherrye Payor    Past Surgical History:  Procedure Laterality Date  . BLADDER TUMOR EXCISION  2011, 2012, 2013   Dr Estill Dooms, [facility]  . CATARACT EXTRACTION   2003   Bilateral, [facility]  . Colonoscopy with polypectomy  2005   Dr. Verl Blalock  . Coronary artery stenting  2005   Dr. Olevia Perches, 3 vessel  . LITHOTRIPSY  2002  . UPPER GASTROINTESTINAL ENDOSCOPY  2005   Esophageal reflux, hiatal hernia    Current Medications: Prior to Admission medications   Medication Sig Start Date End Date Taking? Authorizing Provider  amLODipine (NORVASC) 5 MG tablet TAKE 1 TABLET BY MOUTH EVERY DAY 02/19/15   Sherren Mocha, MD  aspirin 81 MG tablet Take 81 mg by mouth daily.      Historical Provider, MD  clopidogrel (PLAVIX) 75 MG tablet Take 1 tablet (75 mg total) by mouth daily. 01/11/16   Jolaine Artist, MD  clotrimazole (LOTRIMIN) 1 % cream Apply 1 application topically 2 (two) times daily. 01/31/17   Renee A Kuneff, DO  fexofenadine (ALLEGRA) 180 MG tablet Take 180 mg by mouth daily as needed for allergies or rhinitis.  04/01/12   Hendricks Limes, MD  fluticasone (FLONASE) 50 MCG/ACT nasal spray Place 2 sprays into both nostrils daily. --- Office visit needed for further refills 12/09/16   Binnie Rail, MD  Fluticasone-Salmeterol (ADVAIR) 100-50 MCG/DOSE AEPB Inhale 1 puff into the lungs 2 (two) times daily as needed (sob or wheezing).  Historical Provider, MD  Glycerin-Hypromellose-PEG 400 0.2-0.2-1 % SOLN Place 1 drop into both eyes as needed.    Historical Provider, MD  MYRBETRIQ 50 MG TB24 tablet Take 50 mg by mouth daily.  07/20/13   Historical Provider, MD  nitroGLYCERIN (NITROSTAT) 0.4 MG SL tablet Place 1 tablet (0.4 mg total) under the tongue every 5 (five) minutes as needed for chest pain. 03/27/16   Sherren Mocha, MD  ranitidine (ZANTAC) 150 MG tablet TAKE 1 TABLET(150 MG) BY MOUTH TWICE DAILY 02/04/17   Binnie Rail, MD  rosuvastatin (CRESTOR) 5 MG tablet TAKE 1 TABLET BY MOUTH DAILY ON Laurine Blazer, AND FRIDAY 12/09/16   Sherren Mocha, MD  sildenafil (VIAGRA) 100 MG tablet Take 50 mg by mouth daily as needed for erectile dysfunction.     Historical Provider, MD  sucralfate (CARAFATE) 1 g tablet TK 1 T PO TID WITH MEALS 09/26/16   Historical Provider, MD  triamcinolone ointment (KENALOG) 0.5 % Apply 1 application topically 2 (two) times daily. Use for no more than 14 days 02/12/17   Binnie Rail, MD    Allergies:   Hydromorphone hcl; Phenytoin; Zocor [simvastatin - high dose]; Crestor [rosuvastatin calcium]; Hydromorphone hcl; and Statins   Social History   Social History  . Marital status: Married    Spouse name: N/A  . Number of children: N/A  . Years of education: N/A   Social History Main Topics  . Smoking status: Former Smoker    Types: Cigarettes    Quit date: 11/17/1960  . Smokeless tobacco: Never Used     Comment: smoked 1948-1962, up to 1/2 ppd  . Alcohol use 6.0 oz/week    10 Glasses of wine per week     Comment: wine @ dinner  . Drug use: No  . Sexual activity: Not Asked   Other Topics Concern  . None   Social History Narrative  . None     Family History:  The patient's family history includes Alcohol abuse in his brother; Asthma in his brother; Coronary artery disease in his father; Diabetes in his brother; Heart attack in his mother; Heart disease in his paternal grandmother; Hypertension in his mother; Prostate cancer in his maternal grandfather; Stroke in his mother.   ROS:   Please see the history of present illness.    ROS All other systems reviewed and are negative.   PHYSICAL EXAM:   VS:  BP 130/60 (BP Location: Left Arm)   Pulse 64   Ht 5\' 6"  (1.676 m)   Wt 153 lb 12.8 oz (69.8 kg)   BMI 24.82 kg/m    GEN: Well nourished, well developed, in no acute distress  HEENT: normal  Neck: no JVD, carotid bruits, or masses Cardiac: RRR; no murmurs, rubs, or gallops Respiratory:  clear to auscultation bilaterally, normal work of breathing GI: soft, nontender, nondistended, + BS MS: no deformity or atrophy  Skin: warm and dry, no rash Neuro:  Alert and Oriented x 3, Strength and sensation  are intact Psych: euthymic mood, full affect Extremity: BL LE edema R (1+) > L (trace). Right leg has a bruising and significant swelling. Non-tender to palpation. Wt Readings from Last 3 Encounters:  03/10/17 153 lb 12.8 oz (69.8 kg)  02/12/17 150 lb (68 kg)  01/31/17 149 lb 8 oz (67.8 kg)      Studies/Labs Reviewed:   EKG:  EKG is not ordered today.  Recent Labs: No results found for requested labs within last 8760  hours.   Lipid Panel    Component Value Date/Time   CHOL 155 07/30/2015 0737   TRIG 41.0 07/30/2015 0737   TRIG 39 10/21/2006 1059   HDL 68.70 07/30/2015 0737   CHOLHDL 2 07/30/2015 0737   VLDL 8.2 07/30/2015 0737   LDLCALC 78 07/30/2015 0737    Additional studies/ records that were reviewed today include:   Exercise Myoivew 04/08/16  Nuclear stress EF: 62%. The left ventricular ejection fraction is normal (55-65%).  There was no ST segment deviation noted during stress.  The study is normal.  This is a low risk study    ASSESSMENT & PLAN:    1. CAD with stenting to LAD in 2005 - Last exercise myoview 03/2016 was low risk. No angina or dyspnea. Continue dual antiplatelet therapy and statin.  2. HTN - Stble and well controlled on current regimen.  3. HLD - No results found for requested labs within last 8760 hours. Continue statin. Managed by PCP.   4. Right lower extremity swelling and bruising - Due to injury and bleeding. He was feeling better when he he was on Kenalog ointment. His swelling and bruising persisted since then. However burning sensation returned after he stopped using Kenalog ointment. Advice to restart. Will check CBC as he is on dual antiplatelet therapy. He has no pain with walking. Discuss lower extremity Doppler however he wants to wait for now. Agree with that given no shortness of breath or pain with walking. He will call us or follow up with CP if no improvement/worsen.     Medication Adjustments/Labs and Tests  Ordered: Current medicines are reviewed at length with the patient today.  Concerns regarding medicines are outlined above.  Medication changes, Labs and Tests ordered today are listed in the Patient Instructions below. Patient Instructions  Medication Instructions:  Your physician recommends that you continue on your current medications as directed. Please refer to the Current Medication list given to you today.   Labwork: TODAY CBC  Testing/Procedures: NONE ORDERED  Follow-Up: Your physician wants you to follow-up in: [place] will receive a reminder letter in the mail two months in advance. If you don't receive a letter, please call our office to schedule the follow-up appointment.   Any Other Special Instructions Will Be Listed Below (If Applicable).     If you need a refill on your cardiac medications before your next appointment, please call your pharmacy.      Jarrett Soho, [place]  03/10/2017 9:24 AM    [facility] [place], [place], [place]  [address] Phone: [phone]; Fax: [phone]

## 2017-03-10 ENCOUNTER — Encounter: Payer: Self-pay | Admitting: Physician Assistant

## 2017-03-10 ENCOUNTER — Ambulatory Visit (INDEPENDENT_AMBULATORY_CARE_PROVIDER_SITE_OTHER): Payer: Medicare Other | Admitting: Physician Assistant

## 2017-03-10 VITALS — BP 130/60 | HR 64 | Ht 66.0 in | Wt 153.8 lb

## 2017-03-10 DIAGNOSIS — E7849 Other hyperlipidemia: Secondary | ICD-10-CM

## 2017-03-10 DIAGNOSIS — T148XXA Other injury of unspecified body region, initial encounter: Secondary | ICD-10-CM

## 2017-03-10 DIAGNOSIS — M7989 Other specified soft tissue disorders: Secondary | ICD-10-CM | POA: Diagnosis not present

## 2017-03-10 DIAGNOSIS — I10 Essential (primary) hypertension: Secondary | ICD-10-CM | POA: Diagnosis not present

## 2017-03-10 DIAGNOSIS — I2510 Atherosclerotic heart disease of native coronary artery without angina pectoris: Secondary | ICD-10-CM

## 2017-03-10 DIAGNOSIS — E784 Other hyperlipidemia: Secondary | ICD-10-CM | POA: Diagnosis not present

## 2017-03-10 LAB — CBC
HEMATOCRIT: 40 % (ref 37.5–51.0)
HEMOGLOBIN: 13.7 g/dL (ref 13.0–17.7)
MCH: 32.5 pg (ref 26.6–33.0)
MCHC: 34.3 g/dL (ref 31.5–35.7)
MCV: 95 fL (ref 79–97)
Platelets: 178 10*3/uL (ref 150–379)
RBC: 4.22 x10E6/uL (ref 4.14–5.80)
RDW: 13.4 % (ref 12.3–15.4)
WBC: 5.4 10*3/uL (ref 3.4–10.8)

## 2017-03-10 NOTE — Patient Instructions (Signed)
Medication Instructions:  Your physician recommends that you continue on your current medications as directed. Please refer to the Current Medication list given to you today.   Labwork: TODAY CBC  Testing/Procedures: NONE ORDERED  Follow-Up: Your physician wants you to follow-up in: [place] will receive a reminder letter in the mail two months in advance. If you don't receive a letter, please call our office to schedule the follow-up appointment.   Any Other Special Instructions Will Be Listed Below (If Applicable).     If you need a refill on your cardiac medications before your next appointment, please call your pharmacy.

## 2017-03-12 ENCOUNTER — Ambulatory Visit: Payer: Medicare Other | Admitting: Cardiovascular Disease

## 2017-03-17 ENCOUNTER — Other Ambulatory Visit: Payer: Self-pay | Admitting: Internal Medicine

## 2017-03-17 ENCOUNTER — Ambulatory Visit
Admission: RE | Admit: 2017-03-17 | Discharge: 2017-03-17 | Disposition: A | Discharge status: Home / Self Care | Payer: Medicare Other | Source: Ambulatory Visit | Attending: Internal Medicine | Admitting: Internal Medicine

## 2017-03-17 DIAGNOSIS — M25571 Pain in right ankle and joints of right foot: Secondary | ICD-10-CM | POA: Diagnosis not present

## 2017-03-17 DIAGNOSIS — I10 Essential (primary) hypertension: Secondary | ICD-10-CM | POA: Diagnosis not present

## 2017-03-17 DIAGNOSIS — M7989 Other specified soft tissue disorders: Secondary | ICD-10-CM | POA: Diagnosis not present

## 2017-03-17 DIAGNOSIS — N210 Calculus in bladder: Secondary | ICD-10-CM | POA: Diagnosis not present

## 2017-03-17 DIAGNOSIS — N400 Enlarged prostate without lower urinary tract symptoms: Secondary | ICD-10-CM | POA: Diagnosis not present

## 2017-03-17 DIAGNOSIS — K219 Gastro-esophageal reflux disease without esophagitis: Secondary | ICD-10-CM | POA: Diagnosis not present

## 2017-03-17 DIAGNOSIS — I2510 Atherosclerotic heart disease of native coronary artery without angina pectoris: Secondary | ICD-10-CM | POA: Diagnosis not present

## 2017-03-17 DIAGNOSIS — E7800 Pure hypercholesterolemia, unspecified: Secondary | ICD-10-CM | POA: Diagnosis not present

## 2017-03-17 DIAGNOSIS — C679 Malignant neoplasm of bladder, unspecified: Secondary | ICD-10-CM | POA: Diagnosis not present

## 2017-03-18 ENCOUNTER — Encounter: Payer: Self-pay | Admitting: *Deleted

## 2017-03-20 ENCOUNTER — Other Ambulatory Visit (HOSPITAL_COMMUNITY): Payer: Self-pay | Admitting: Cardiovascular Disease

## 2017-03-25 DIAGNOSIS — D239 Other benign neoplasm of skin, unspecified: Secondary | ICD-10-CM | POA: Diagnosis not present

## 2017-03-25 DIAGNOSIS — Z961 Presence of intraocular lens: Secondary | ICD-10-CM | POA: Diagnosis not present

## 2017-04-10 NOTE — Progress Notes (Signed)
[name] [name] was seen today in the movement disorders clinic for neurologic consultation at the request of Burns, [name], MD.  The consultation is for the evaluation of gait instability for approximately 2-3 years and tremor in the right hand.  Pt states that he didn't even notice the tremor but Dr. [name] pointed it out to him.  States that balance is off especially when on uneven ground.  He feels that he is "wobbling."  He has not had falls but has had near falls.  Some paresthesias in the feet at night when wakes up but otherwise no.  No DM.  Did stop his crestor because thought that it was causing myalgia.  They just restarted but he is only taking 2 tablets a week.  He had increased LFT's on zocor The records that were made available to me were reviewed.  04/01/16 update:  The patient is following up today.  I last saw him in November.  At that point in time, I did think that some of his gait changes were from peripheral neuropathy.  I did lab work to rule out reversible causes and his B12 was low at 259.  I asked him to start taking an oral supplement and he states that he does that but forgets about 1/2 the time.  He also has a history of a parkinsonian tremor, without meeting the [place] brain bank criteria for Parkinson's disease.  That is primary reason I am following him up in 6 month intervals.   He states that he never has noted it on the L really and it doesn't bother him.  He has noted some tremor in the right leg.  He states that he did much better physically after going through PT.   I did review records since our last visit.  He recently saw cardiology on 03/27/2016.  He had atypical angina.  He is scheduled for an exercise stress Myoview.  04/14/16 update:  Pt seen today in follow up.  I haven't seen him in a year.  The records that were made available to me were reviewed.    He went through PT in the fall of last year.  States that he slipped on a wet concrete block.  He did injure his  ankle opened up the skin.  Has seen several physicians/practioners about it.  No fx to it.  He states that it has healed it.    Does c/o some word finding and memory retrieval.   States that he used to be able to do complex math and cannot do anymore.  If he sleeps better he does better.    PREVIOUS MEDICATIONS: none to date  ALLERGIES:   Allergies  Allergen Reactions  . Hydromorphone Hcl      Dilaudid Rxed for renal calculi caused angioedema   . Phenytoin Swelling  . Zocor [Simvastatin - High Dose]     Elevated hepatic enzymes  . Crestor [Rosuvastatin Calcium] Other (See Comments)    Leg pain. Not as bad since on a lower dose  . Hydromorphone Hcl      Dilaudid Rxed for renal calculi caused angioedema  . Statins     Leg pain    CURRENT MEDICATIONS:  Outpatient Encounter Prescriptions as of 04/14/2017  Medication Sig  . amLODipine (NORVASC) 5 MG tablet TAKE 1 TABLET BY MOUTH EVERY DAY  . aspirin 81 MG tablet Take 81 mg by mouth daily.    . clopidogrel (PLAVIX) 75 MG tablet TAKE  1 TABLET BY MOUTH DAILY  . fexofenadine (ALLEGRA) 180 MG tablet Take 180 mg by mouth daily as needed for allergies or rhinitis.   . fluticasone (FLONASE) 50 MCG/ACT nasal spray Place 2 sprays into both nostrils daily. --- Office visit needed for further refills  . Fluticasone-Salmeterol (ADVAIR) 100-50 MCG/DOSE AEPB Inhale 1 puff into the lungs 2 (two) times daily as needed (sob or wheezing).  . Glycerin-Hypromellose-PEG 400 0.2-0.2-1 % SOLN Place 1 drop into both eyes as needed.  Marland Kitchen MYRBETRIQ 50 MG TB24 tablet Take 50 mg by mouth daily.   . nitroGLYCERIN (NITROSTAT) 0.4 MG SL tablet Place 1 tablet (0.4 mg total) under the tongue every 5 (five) minutes as needed for chest pain.  . pantoprazole (PROTONIX) 20 MG tablet Take 1 tablet by mouth daily.  . sildenafil (VIAGRA) 100 MG tablet Take 50 mg by mouth daily as needed for erectile dysfunction.  . triamcinolone ointment (KENALOG) 0.5 % Apply 1 application  topically 2 (two) times daily. Use for no more than 14 days  . [DISCONTINUED] clotrimazole (LOTRIMIN) 1 % cream Apply 1 application topically 2 (two) times daily.  . [DISCONTINUED] ranitidine (ZANTAC) 150 MG tablet TAKE 1 TABLET(150 MG) BY MOUTH TWICE DAILY  . [DISCONTINUED] rosuvastatin (CRESTOR) 5 MG tablet TAKE 1 TABLET BY MOUTH DAILY ON MONDAY, WEDNESDAY, AND FRIDAY  . [DISCONTINUED] sucralfate (CARAFATE) 1 g tablet TK 1 T PO TID WITH MEALS   No facility-administered encounter medications on file as of 04/14/2017.     PAST MEDICAL HISTORY:   Past Medical History:  Diagnosis Date  . Asthma    post infectious  . Bladder cancer [facility])    Dr Estill Dooms, [facility]  . CAD (coronary artery disease)    a. s/p Cypher DES x 2 to prox LAD and Cypher DES x 1 to distal LAD 08/2009;  b. cath with residual D2 50% 10/05 and normal EF;  c. Myoview 6/11: EF 60%, mild fixed INF, inferoseptal, inferoapical thinning, no ischemia, low risk, EF 60%  . Colon polyps   . GERD (gastroesophageal reflux disease)   . HLD (hyperlipidemia)    h/o elevated LFTs on Zocor  . Nephrolithiasis   . Other abnormal glucose 2012   FBS 104  . Skin cancer, basal cell    Dr Sherrye Payor    PAST SURGICAL HISTORY:   Past Surgical History:  Procedure Laterality Date  . BLADDER TUMOR EXCISION  2011, 2012, 2013   Dr Estill Dooms, [facility]  . CATARACT EXTRACTION  2003   Bilateral, [facility] Ophth  . Colonoscopy with polypectomy  2005   Dr. Verl Blalock  . Coronary artery stenting  2005   Dr. Olevia Perches, 3 vessel  . LITHOTRIPSY  2002  . UPPER GASTROINTESTINAL ENDOSCOPY  2005   Esophageal reflux, hiatal hernia    SOCIAL HISTORY:   Social History   Social History  . Marital status: Married    Spouse name: N/A  . Number of children: N/A  . Years of education: N/A   Occupational History  . Not on file.   Social History Main Topics  . Smoking status: Former Smoker    Types: Cigarettes    Quit date: 11/17/1960  . Smokeless tobacco:  Never Used     Comment: smoked 1948-1962, up to 1/2 ppd  . Alcohol use 6.0 oz/week    10 Glasses of wine per week     Comment: wine @ dinner  . Drug use: No  . Sexual activity: Not on file  Other Topics Concern  . Not on file   Social History Narrative  . No narrative on file    FAMILY HISTORY:   Family Status  Relation Status  . Mother Deceased at age 16       stroke  . Father Deceased       heart disease  . PGM Deceased  . Brother Deceased       ETOH, depression  . Brother Alive       healthy  . Brother Alive       healthy  . Sister Alive       healthy  . Child Alive       2, alive and well  . Brother (Not Specified)  . Brother (Not Specified)  . MGF Deceased  . MGM Deceased  . PGF Deceased    ROS:  Denies neck or back pain.  No lateralizing weakness or paresthesias.  A complete 10 system review of systems was obtained and was unremarkable apart from what is mentioned above.  PHYSICAL EXAMINATION:    VITALS:   Vitals:   04/14/17 1422  BP: 120/70  Pulse: 70  SpO2: 98%  Weight: 151 lb (68.5 kg)  Height: 5\' 6"  (1.676 m)    GEN:  The patient appears stated age and is in NAD. HEENT:  Normocephalic, atraumatic.  The mucous membranes are moist. The superficial temporal arteries are without ropiness or tenderness. CV:  RRR Lungs:  CTAB Neck/HEME:  There are no carotid bruits bilaterally.  Neurological examination:  Orientation: The patient is alert and oriented x3. Fund of knowledge is appropriate.  Recent and remote memory are intact.  Attention and concentration are normal.    Able to name objects and repeat phrases. Cranial nerves: There is good facial symmetry.  The speech is fluent and clear. Soft palate rises symmetrically and there is no tongue deviation. Hearing is intact to conversational tone. Sensation: Sensation is intact to light intact.   Motor: Strength is 5/5 in the bilateral upper and lower extremities.   Shoulder shrug is equal and  symmetric.  There is no pronator drift. Deep tendon reflexes: Deep tendon reflexes are 3/4 at the bilateral biceps, triceps, brachioradialis, patella and 2/4 at the bilateral achilles. Plantar responses are downgoing bilaterally.  Movement examination: Tone: There is no rigidity today Abnormal movements: There is an irregular resting tremor in the RUE that is rare Coordination:  There is no decremation with RAM's, with any form of RAMS, including alternating supination and pronation of the forearm, hand opening and closing, finger taps, heel taps and toe taps. Gait and Station: The patient has no difficulty arising out of a deep-seated chair without the use of the hands. The patient's stride length is mildly decreased.  Lab Results  Component Value Date   VITAMINB12 399 04/01/2016    Lab Results  Component Value Date   TSH 3.75 09/18/2015   Lab Results  Component Value Date   HGBA1C 4.7 09/18/2015     Chemistry      Component Value Date/Time   NA 142 09/18/2015 1556   K 3.5 09/18/2015 1556   CL 107 09/18/2015 1556   CO2 28 09/18/2015 1556   BUN 18 09/18/2015 1556   CREATININE 1.01 09/18/2015 1556      Component Value Date/Time   CALCIUM 9.1 09/18/2015 1556   ALKPHOS 116 07/30/2015 0737   AST 20 07/30/2015 0737   ALT 20 07/30/2015 0737   BILITOT 0.8 07/30/2015 0737  ASSESSMENT/PLAN:  1.  Gait instability, in part due to PN  -labs for reversible causes of PN negative, except mild B12 deficiency  -wants to resume PT as found it very helpful and will send an order to neurorehab center 2.  Parkinsonian tremor  -While the patient does have a parkinsonian tremor on the right, he still does not meet formal criteria for the diagnosis of idiopathic Parkinson's disease.  This certainly does not mean this won't develop in the future and I would like to see him every 6 months just to make sure.  -I did talk to him about DaT scan and he decided to hold on that  3.  B12  deficiency  -started oral B12 supplements but admits not very faithful with it  -will recheck level 4.  Word finding trouble/memory loss  -likely MCI.  Offered neurocognitive testing and declined.  Will let me know if changes mind. 5.  F/u 6 months, sooner should new issues arise.  Much greater than 50% of this visit was spent in counseling and coordinating care.  Total face to face time:  25 min

## 2017-04-14 ENCOUNTER — Ambulatory Visit (INDEPENDENT_AMBULATORY_CARE_PROVIDER_SITE_OTHER): Payer: Medicare Other | Admitting: Neurology

## 2017-04-14 ENCOUNTER — Encounter: Payer: Self-pay | Admitting: Neurology

## 2017-04-14 VITALS — BP 120/70 | HR 70 | Ht 66.0 in | Wt 151.0 lb

## 2017-04-14 DIAGNOSIS — R413 Other amnesia: Secondary | ICD-10-CM

## 2017-04-14 DIAGNOSIS — I2510 Atherosclerotic heart disease of native coronary artery without angina pectoris: Secondary | ICD-10-CM | POA: Diagnosis not present

## 2017-04-14 DIAGNOSIS — R251 Tremor, unspecified: Secondary | ICD-10-CM | POA: Diagnosis not present

## 2017-04-14 DIAGNOSIS — G3184 Mild cognitive impairment, so stated: Secondary | ICD-10-CM

## 2017-06-19 DIAGNOSIS — H6120 Impacted cerumen, unspecified ear: Secondary | ICD-10-CM | POA: Diagnosis not present

## 2017-06-23 DIAGNOSIS — H6982 Other specified disorders of Eustachian tube, left ear: Secondary | ICD-10-CM | POA: Diagnosis not present

## 2017-07-21 DIAGNOSIS — D239 Other benign neoplasm of skin, unspecified: Secondary | ICD-10-CM | POA: Diagnosis not present

## 2017-07-21 DIAGNOSIS — H35033 Hypertensive retinopathy, bilateral: Secondary | ICD-10-CM | POA: Insufficient documentation

## 2017-07-21 DIAGNOSIS — Z961 Presence of intraocular lens: Secondary | ICD-10-CM | POA: Diagnosis not present

## 2017-07-21 DIAGNOSIS — H353131 Nonexudative age-related macular degeneration, bilateral, early dry stage: Secondary | ICD-10-CM | POA: Insufficient documentation

## 2017-08-11 DIAGNOSIS — E7800 Pure hypercholesterolemia, unspecified: Secondary | ICD-10-CM | POA: Diagnosis not present

## 2017-08-11 DIAGNOSIS — K219 Gastro-esophageal reflux disease without esophagitis: Secondary | ICD-10-CM | POA: Diagnosis not present

## 2017-08-11 DIAGNOSIS — C679 Malignant neoplasm of bladder, unspecified: Secondary | ICD-10-CM | POA: Diagnosis not present

## 2017-08-11 DIAGNOSIS — Z23 Encounter for immunization: Secondary | ICD-10-CM | POA: Diagnosis not present

## 2017-08-11 DIAGNOSIS — Z1389 Encounter for screening for other disorder: Secondary | ICD-10-CM | POA: Diagnosis not present

## 2017-08-11 DIAGNOSIS — I2510 Atherosclerotic heart disease of native coronary artery without angina pectoris: Secondary | ICD-10-CM | POA: Diagnosis not present

## 2017-08-11 DIAGNOSIS — R7301 Impaired fasting glucose: Secondary | ICD-10-CM | POA: Diagnosis not present

## 2017-08-11 DIAGNOSIS — I10 Essential (primary) hypertension: Secondary | ICD-10-CM | POA: Diagnosis not present

## 2017-08-11 DIAGNOSIS — Z0000 Encounter for general adult medical examination without abnormal findings: Secondary | ICD-10-CM | POA: Diagnosis not present

## 2017-08-11 DIAGNOSIS — N400 Enlarged prostate without lower urinary tract symptoms: Secondary | ICD-10-CM | POA: Diagnosis not present

## 2017-08-22 ENCOUNTER — Other Ambulatory Visit: Payer: Self-pay | Admitting: Cardiovascular Disease

## 2017-10-04 DIAGNOSIS — H6123 Impacted cerumen, bilateral: Secondary | ICD-10-CM | POA: Diagnosis not present

## 2017-10-14 NOTE — Progress Notes (Signed)
[name] [name] was seen today in the movement disorders clinic for neurologic consultation at the request of Burns, [name], MD.  The consultation is for the evaluation of gait instability for approximately 2-3 years and tremor in the right hand.  Pt states that he didn't even notice the tremor but Dr. [name] pointed it out to him.  States that balance is off especially when on uneven ground.  He feels that he is "wobbling."  He has not had falls but has had near falls.  Some paresthesias in the feet at night when wakes up but otherwise no.  No DM.  Did stop his crestor because thought that it was causing myalgia.  They just restarted but he is only taking 2 tablets a week.  He had increased LFT's on zocor The records that were made available to me were reviewed.  04/01/16 update:  The patient is following up today.  I last saw him in November.  At that point in time, I did think that some of his gait changes were from peripheral neuropathy.  I did lab work to rule out reversible causes and his B12 was low at 259.  I asked him to start taking an oral supplement and he states that he does that but forgets about 1/2 the time.  He also has a history of a parkinsonian tremor, without meeting the [place] brain bank criteria for Parkinson's disease.  That is primary reason I am following him up in 6 month intervals.   He states that he never has noted it on the L really and it doesn't bother him.  He has noted some tremor in the right leg.  He states that he did much better physically after going through PT.   I did review records since our last visit.  He recently saw cardiology on 03/27/2016.  He had atypical angina.  He is scheduled for an exercise stress Myoview.  04/14/17 update:  Pt seen today in follow up.  I haven't seen him in a year.  The records that were made available to me were reviewed.    He went through PT in the fall of last year.  States that he slipped on a wet concrete block.  He did injure his  ankle opened up the skin.  Has seen several physicians/practioners about it.  No fx to it.  He states that it has healed it.    Does c/o some word finding and memory retrieval.   States that he used to be able to do complex math and cannot do anymore.  If he sleeps better he does better.    10/15/17 update:  Pt seen in f/u for tremor.  Reports that tremor slightly worse.  It is the R arm.  Balance has not been as good but no falls.  He walks better if he is going fast.  He describes some festinating gait occasionally.  Describes inner tremor.  PREVIOUS MEDICATIONS: none to date  ALLERGIES:   Allergies  Allergen Reactions  . Hydromorphone Hcl      Dilaudid Rxed for renal calculi caused angioedema   . Phenytoin Swelling  . Zocor [Simvastatin - High Dose]     Elevated hepatic enzymes  . Crestor [Rosuvastatin Calcium] Other (See Comments)    Leg pain. Not as bad since on a lower dose  . Hydromorphone Hcl      Dilaudid Rxed for renal calculi caused angioedema  . Statins     Leg  pain    CURRENT MEDICATIONS:  Outpatient Encounter Medications as of 10/15/2017  Medication Sig  . amLODipine (NORVASC) 5 MG tablet TAKE 1 TABLET BY MOUTH EVERY DAY  . aspirin 81 MG tablet Take 81 mg by mouth daily.    . clopidogrel (PLAVIX) 75 MG tablet TAKE 1 TABLET BY MOUTH DAILY  . fluticasone (FLONASE) 50 MCG/ACT nasal spray Place 2 sprays into both nostrils daily. --- Office visit needed for further refills  . Glycerin-Hypromellose-PEG 400 0.2-0.2-1 % SOLN Place 1 drop into both eyes as needed.  . pantoprazole (PROTONIX) 20 MG tablet Take 1 tablet by mouth daily.  . sildenafil (VIAGRA) 100 MG tablet Take 50 mg by mouth daily as needed for erectile dysfunction.  . triamcinolone ointment (KENALOG) 0.5 % Apply 1 application topically 2 (two) times daily. Use for no more than 14 days  . carbidopa-levodopa (SINEMET IR) 25-100 MG tablet Take 1 tablet by mouth 3 (three) times daily.  . nitroGLYCERIN (NITROSTAT)  0.4 MG SL tablet Place 1 tablet (0.4 mg total) under the tongue every 5 (five) minutes as needed for chest pain. (Patient not taking: Reported on 10/15/2017)  . [DISCONTINUED] amLODipine (NORVASC) 5 MG tablet TAKE 1 TABLET BY MOUTH EVERY DAY  . [DISCONTINUED] fexofenadine (ALLEGRA) 180 MG tablet Take 180 mg by mouth daily as needed for allergies or rhinitis.   . [DISCONTINUED] Fluticasone-Salmeterol (ADVAIR) 100-50 MCG/DOSE AEPB Inhale 1 puff into the lungs 2 (two) times daily as needed (sob or wheezing).  . [DISCONTINUED] MYRBETRIQ 50 MG TB24 tablet Take 50 mg by mouth daily.    No facility-administered encounter medications on file as of 10/15/2017.     PAST MEDICAL HISTORY:   Past Medical History:  Diagnosis Date  . Asthma    post infectious  . Bladder cancer [facility])    Dr Estill Dooms, [facility]  . CAD (coronary artery disease)    a. s/p Cypher DES x 2 to prox LAD and Cypher DES x 1 to distal LAD 08/2009;  b. cath with residual D2 50% 10/05 and normal EF;  c. Myoview 6/11: EF 60%, mild fixed INF, inferoseptal, inferoapical thinning, no ischemia, low risk, EF 60%  . Colon polyps   . GERD (gastroesophageal reflux disease)   . HLD (hyperlipidemia)    h/o elevated LFTs on Zocor  . Nephrolithiasis   . Other abnormal glucose 2012   FBS 104  . Skin cancer, basal cell    Dr Sherrye Payor    PAST SURGICAL HISTORY:   Past Surgical History:  Procedure Laterality Date  . BLADDER TUMOR EXCISION  2011, 2012, 2013   Dr Estill Dooms, [facility]  . CATARACT EXTRACTION  2003   Bilateral, [facility] Ophth  . Colonoscopy with polypectomy  2005   Dr. Verl Blalock  . Coronary artery stenting  2005   Dr. Olevia Perches, 3 vessel  . LITHOTRIPSY  2002  . UPPER GASTROINTESTINAL ENDOSCOPY  2005   Esophageal reflux, hiatal hernia    SOCIAL HISTORY:   Social History   Socioeconomic History  . Marital status: Married    Spouse name: Not on file  . Number of children: Not on file  . Years of education: Not on file  .  Highest education level: Not on file  Social Needs  . Financial resource strain: Not on file  . Food insecurity - worry: Not on file  . Food insecurity - inability: Not on file  . Transportation needs - medical: Not on file  . Transportation needs -  non-medical: Not on file  Occupational History  . Not on file  Tobacco Use  . Smoking status: Former Smoker    Types: Cigarettes    Last attempt to quit: 11/17/1960    Years since quitting: 56.9  . Smokeless tobacco: Never Used  . Tobacco comment: smoked 1948-1962, up to 1/2 ppd  Substance and Sexual Activity  . Alcohol use: Yes    Alcohol/week: 6.0 oz    Types: 10 Glasses of wine per week    Comment: wine @ dinner  . Drug use: No  . Sexual activity: Not on file  Other Topics Concern  . Not on file  Social History Narrative  . Not on file    FAMILY HISTORY:   Family Status  Relation Name Status  . Mother  Deceased at age 53       stroke  . Father  Deceased       heart disease  . PGM  Deceased  . Brother  Deceased       ETOH, depression  . Brother  Alive       healthy  . Brother  Alive       healthy  . Sister  Alive       healthy  . Child  Alive       2, alive and well  . Brother  (Not Specified)  . Brother Pilar Plate (Not Specified)  . MGF  Deceased  . MGM  Deceased  . PGF  Deceased    ROS:   A complete 10 system review of systems was obtained and was unremarkable apart from what is mentioned above.  PHYSICAL EXAMINATION:    VITALS:   Vitals:   10/15/17 1427  BP: (!) 148/70  Pulse: 72  SpO2: 98%  Weight: 146 lb (66.2 kg)  Height: 5' 4.5" (1.638 m)    GEN:  The patient appears stated age and is in NAD. HEENT:  Normocephalic, atraumatic.  The mucous membranes are moist. The superficial temporal arteries are without ropiness or tenderness. CV:  RRR Lungs:  CTAB Neck/HEME:  There are no carotid bruits bilaterally.  Neurological examination:  Orientation: The patient is alert and oriented x3. Fund of  knowledge is appropriate.  Recent and remote memory are intact.  Attention and concentration are normal.    Able to name objects and repeat phrases. Cranial nerves: There is good facial symmetry.  The speech is fluent and clear. Soft palate rises symmetrically and there is no tongue deviation. Hearing is intact to conversational tone. Sensation: Sensation is intact to light intact.   Motor: Strength is 5/5 in the bilateral upper and lower extremities.   Shoulder shrug is equal and symmetric.  There is no pronator drift. Deep tendon reflexes: Deep tendon reflexes are 3/4 at the bilateral biceps, triceps, brachioradialis, patella and 2/4 at the bilateral achilles. Plantar responses are downgoing bilaterally.  Movement examination: Tone: There is mild to mod increased tone in the RUE Abnormal movements: There is an irregular resting tremor in the RUE that is rare but greatly increased with distraction procedures Coordination:  There is decremation with  alternation of supination/pronation of the forearm bilaterally Gait and Station: The patient has no difficulty arising out of a deep-seated chair without the use of the hands. The patient's stride length is mildly decreased.  He has some trouble with the turn.  Lab Results  Component Value Date   VITAMINB12 399 04/01/2016    Lab Results  Component Value Date  TSH 3.75 09/18/2015   Lab Results  Component Value Date   HGBA1C 4.7 09/18/2015     Chemistry      Component Value Date/Time   NA 142 09/18/2015 1556   K 3.5 09/18/2015 1556   CL 107 09/18/2015 1556   CO2 28 09/18/2015 1556   BUN 18 09/18/2015 1556   CREATININE 1.01 09/18/2015 1556      Component Value Date/Time   CALCIUM 9.1 09/18/2015 1556   ALKPHOS 116 07/30/2015 0737   AST 20 07/30/2015 0737   ALT 20 07/30/2015 0737   BILITOT 0.8 07/30/2015 0737       ASSESSMENT/PLAN:  1.  Gait instability, in part due to PN  -labs for reversible causes of PN negative, except  mild B12 deficiency  2.  Parkinson's disease  -I have been watching him for about 2 years and today is the first time that he actually meets criteria for mild, idiopathic Parkinson's disease.  We talked at length about the diagnosis and its implications.  We talked about pathophysiology.  We talked about the implications.  We talked about the value of exercise.  We talked about medications.  We decided to start carbidopa/levodopa 25/100 and work to 1 tablet 3 times per day.  Risks, benefits, side effects and alternative therapies were discussed.  The opportunity to ask questions was given and they were answered to the best of my ability.  The patient expressed understanding and willingness to follow the outlined treatment protocols.  -I would love to see him get involved with rock steady boxing and we discussed this today.    -He met with my social worker today. 3.  B12 deficiency  -Continue supplement.   4.  Word finding trouble/memory loss  -likely MCI.  I see no evidence of a neurodegenerative dementia process. 5.  Follow up is anticipated in the next 3 months, sooner should new neurologic issues arise.  Much greater than 50% of this visit was spent in counseling and coordinating care.  Total face to face time:  30 min

## 2017-10-15 ENCOUNTER — Ambulatory Visit: Payer: Medicare Other

## 2017-10-15 ENCOUNTER — Encounter: Payer: Self-pay | Admitting: Psychology

## 2017-10-15 ENCOUNTER — Ambulatory Visit (INDEPENDENT_AMBULATORY_CARE_PROVIDER_SITE_OTHER): Payer: Medicare Other | Admitting: Neurology

## 2017-10-15 ENCOUNTER — Encounter: Payer: Self-pay | Admitting: Neurology

## 2017-10-15 VITALS — BP 148/70 | HR 72 | Ht 64.5 in | Wt 146.0 lb

## 2017-10-15 DIAGNOSIS — I2510 Atherosclerotic heart disease of native coronary artery without angina pectoris: Secondary | ICD-10-CM

## 2017-10-15 DIAGNOSIS — E538 Deficiency of other specified B group vitamins: Secondary | ICD-10-CM | POA: Diagnosis not present

## 2017-10-15 DIAGNOSIS — G20 Parkinson's disease: Secondary | ICD-10-CM | POA: Diagnosis not present

## 2017-10-15 MED ORDER — CARBIDOPA-LEVODOPA 25-100 MG PO TABS: 1.0000 | ORAL_TABLET | Freq: Three times a day (TID) | ORAL | 1 refills | Status: DC

## 2017-10-15 NOTE — Progress Notes (Deleted)
Subjective:   [name] [name] is a 83 y.o. male who presents for Medicare Annual/Subsequent preventive examination.  Review of Systems:  No ROS.  Medicare Wellness Visit. Additional risk factors are reflected in the social history.    Sleep patterns: {SX; SLEEP PATTERNS:18802::"feels rested on waking","does not get up to void","gets up *** times nightly to void","sleeps *** hours nightly"}.    Home Safety/Smoke Alarms: Feels safe in home. Smoke alarms in place.  Living environment; residence and Firearm Safety: {Rehab home environment / accessibility:30080::"no firearms","firearms stored safely"}. Seat Belt Safety/Bike Helmet: Wears seat belt.     Objective:    Vitals: There were no vitals taken for this visit.  There is no height or weight on file to calculate BMI.  Tobacco Social History   Tobacco Use  Smoking Status Former Smoker  . Types: Cigarettes  . Last attempt to quit: 11/17/1960  . Years since quitting: 56.9  Smokeless Tobacco Never Used  Tobacco Comment   smoked 1948-1962, up to 1/2 ppd     Counseling given: Not Answered Comment: smoked 1948-1962, up to 1/2 ppd   Past Medical History:  Diagnosis Date  . Asthma    post infectious  . Bladder cancer [facility])    Dr Estill Dooms, [facility]  . CAD (coronary artery disease)    a. s/p Cypher DES x 2 to prox LAD and Cypher DES x 1 to distal LAD 08/2009;  b. cath with residual D2 50% 10/05 and normal EF;  c. Myoview 6/11: EF 60%, mild fixed INF, inferoseptal, inferoapical thinning, no ischemia, low risk, EF 60%  . Colon polyps   . GERD (gastroesophageal reflux disease)   . HLD (hyperlipidemia)    h/o elevated LFTs on Zocor  . Nephrolithiasis   . Other abnormal glucose 2012   FBS 104  . Skin cancer, basal cell    Dr Sherrye Payor   Past Surgical History:  Procedure Laterality Date  . BLADDER TUMOR EXCISION  2011, 2012, 2013   Dr Estill Dooms, [facility]  . CATARACT EXTRACTION  2003   Bilateral, [facility]  . Colonoscopy with  polypectomy  2005   Dr. Verl Blalock  . Coronary artery stenting  2005   Dr. Olevia Perches, 3 vessel  . LITHOTRIPSY  2002  . UPPER GASTROINTESTINAL ENDOSCOPY  2005   Esophageal reflux, hiatal hernia   Family History  Problem Relation Age of Onset  . Hypertension Mother   . Stroke Mother        in 52s  . Heart attack Mother        80  . Coronary artery disease Father        15s  . Heart disease Paternal Grandmother        in 60s  . Asthma Brother   . Diabetes Brother   . Alcohol abuse Brother   . Prostate cancer Maternal Grandfather    Social History   Substance and Sexual Activity  Sexual Activity Not on file    Outpatient Encounter Medications as of 10/15/2017  Medication Sig  . amLODipine (NORVASC) 5 MG tablet TAKE 1 TABLET BY MOUTH EVERY DAY  . aspirin 81 MG tablet Take 81 mg by mouth daily.    . clopidogrel (PLAVIX) 75 MG tablet TAKE 1 TABLET BY MOUTH DAILY  . fluticasone (FLONASE) 50 MCG/ACT nasal spray Place 2 sprays into both nostrils daily. --- Office visit needed for further refills  . Glycerin-Hypromellose-PEG 400 0.2-0.2-1 % SOLN Place 1 drop into both eyes as  needed.  . nitroGLYCERIN (NITROSTAT) 0.4 MG SL tablet Place 1 tablet (0.4 mg total) under the tongue every 5 (five) minutes as needed for chest pain. (Patient not taking: Reported on 10/15/2017)  . pantoprazole (PROTONIX) 20 MG tablet Take 1 tablet by mouth daily.  . sildenafil (VIAGRA) 100 MG tablet Take 50 mg by mouth daily as needed for erectile dysfunction.  . triamcinolone ointment (KENALOG) 0.5 % Apply 1 application topically 2 (two) times daily. Use for no more than 14 days  . [DISCONTINUED] amLODipine (NORVASC) 5 MG tablet TAKE 1 TABLET BY MOUTH EVERY DAY  . [DISCONTINUED] fexofenadine (ALLEGRA) 180 MG tablet Take 180 mg by mouth daily as needed for allergies or rhinitis.   . [DISCONTINUED] Fluticasone-Salmeterol (ADVAIR) 100-50 MCG/DOSE AEPB Inhale 1 puff into the lungs 2 (two) times daily as needed  (sob or wheezing).  . [DISCONTINUED] MYRBETRIQ 50 MG TB24 tablet Take 50 mg by mouth daily.    No facility-administered encounter medications on file as of 10/15/2017.     Activities of Daily Living No flowsheet data found.  Patient Care Team: Binnie Rail, MD as PCP - General (Internal Medicine) Marilynne Halsted, MD as Referring Physician (Ophthalmology) Tat, Eustace Quail, DO as Consulting Physician (Neurology) Sherren Mocha, MD as Consulting Physician (Cardiology) Christy Sartorius, MD as Referring Physician (Urology)   Assessment:    Physical assessment deferred to PCP.  Exercise Activities and Dietary recommendations   Diet (meal preparation, eat out, water intake, caffeinated beverages, dairy products, fruits and vegetables): {Desc; diets:16563}  Goals    None     Fall Risk Fall Risk  10/15/2017 04/14/2017 10/02/2016 01/17/2016 09/14/2015  Falls in the past year? No No No No No  Number falls in past yr: - - - - -  Injury with Fall? - - - - -  Risk for fall due to : - - Impaired balance/gait - -   Depression Screen PHQ 2/9 Scores 10/02/2016 01/17/2016 09/14/2015 12/27/2014  PHQ - 2 Score 0 0 0 2  PHQ- 9 Score - - - 4    Cognitive Function MMSE - Mini Mental State Exam 04/14/2017  Not completed: Refused        Immunization History  Administered Date(s) Administered  . Influenza Split 09/30/2012  . Influenza Whole 09/14/2007, 08/23/2008, 08/14/2010  . Influenza, High Dose Seasonal PF 09/14/2013, 09/14/2015, 10/02/2016  . Influenza,inj,Quad PF,6+ Mos 10/10/2014  . Pneumococcal Conjugate-13 01/17/2016  . Pneumococcal Polysaccharide-23 09/12/1931, 11/01/2012  . Td 05/08/2009  . Zoster 09/11/2009, 08/21/2010   Screening Tests Health Maintenance  Topic Date Due  . INFLUENZA VACCINE  06/17/2017  . TETANUS/TDAP  05/09/2019  . PNA vac Low Risk Adult  Completed      Plan:     I have personally reviewed and noted the following in the patient's chart:    . Medical and social history . Use of alcohol, tobacco or illicit drugs  . Current medications and supplements . Functional ability and status . Nutritional status . Physical activity . Advanced directives . List of other physicians . Vitals . Screenings to include cognitive, depression, and falls . Referrals and appointments  In addition, I have reviewed and discussed with patient certain preventive protocols, quality metrics, and best practice recommendations. A written personalized care plan for preventive services as well as general preventive health recommendations were provided to patient.     Michiel Cowboy, RN  10/15/2017

## 2017-10-15 NOTE — Progress Notes (Signed)
Met with [name] [name] while he was in the office today. We discussed his diagnosis and I introduced him to some resources for him in our community. He expressed feeling a little overwhelmed with the diagnosis. We talked a little about living well with parkinson's and working through this new diagnosis. He seemed to be interested in the Parkinson Cycle or [employer] in terms of exercise. I provided my contact information and told him to contact me anytime on his journey.

## 2017-10-15 NOTE — Patient Instructions (Signed)
1. Start Carbidopa Levodopa as follows:  Take 1/2 tablet three times daily, at least 30 minutes before meals, for one week  Then take 1/2 tablet in the morning, 1/2 tablet in the afternoon, 1 tablet in the evening, at least 30 minutes before meals, for one week  Then take 1/2 tablet in the morning, 1 tablet in the afternoon, 1 tablet in the evening, at least 30 minutes before meals, for one week  Then take 1 tablet three times daily, at least 30 minutes before meals  

## 2017-10-24 ENCOUNTER — Other Ambulatory Visit: Payer: Self-pay | Admitting: Cardiovascular Disease

## 2017-11-19 ENCOUNTER — Telehealth: Payer: Self-pay | Admitting: Psychology

## 2017-11-19 NOTE — Telephone Encounter (Signed)
Patient called wanting to know what his after visit summary stated.  I reviewed the medication instruction and then also sent him a copy of his after visit summary in the mail.

## 2017-11-23 DIAGNOSIS — R41841 Cognitive communication deficit: Secondary | ICD-10-CM | POA: Diagnosis not present

## 2017-11-24 DIAGNOSIS — R41841 Cognitive communication deficit: Secondary | ICD-10-CM | POA: Diagnosis not present

## 2017-11-26 DIAGNOSIS — R41841 Cognitive communication deficit: Secondary | ICD-10-CM | POA: Diagnosis not present

## 2017-11-30 DIAGNOSIS — R41841 Cognitive communication deficit: Secondary | ICD-10-CM | POA: Diagnosis not present

## 2017-12-01 ENCOUNTER — Telehealth: Payer: Self-pay | Admitting: Neurology

## 2017-12-01 DIAGNOSIS — R41841 Cognitive communication deficit: Secondary | ICD-10-CM | POA: Diagnosis not present

## 2017-12-01 NOTE — Telephone Encounter (Signed)
I will address with him next visit.  No need to proceed until I talk with him further.

## 2017-12-01 NOTE — Telephone Encounter (Signed)
Spoke with Margarite Gouge, Speech therapist at [facility], and she saw patient and assessed memory. She was calling to see what memory testing had been completed by our office. She completed a BCAT and patient scored 35/50. She wants to know what steps would be next as far as seeing Dr. Carles Collet to prescribe medication for memory or something else.   I let her know at appt in May patient declined Neurocognitive testing. I let her know this would probably be the next recommended step. She will discuss with patient and have him call if he wants to proceed with neurocognitive workup.   Dr. Carles Collet - if you advise something different please let me know.

## 2017-12-03 DIAGNOSIS — L821 Other seborrheic keratosis: Secondary | ICD-10-CM | POA: Diagnosis not present

## 2017-12-03 DIAGNOSIS — D692 Other nonthrombocytopenic purpura: Secondary | ICD-10-CM | POA: Diagnosis not present

## 2017-12-03 DIAGNOSIS — Z85828 Personal history of other malignant neoplasm of skin: Secondary | ICD-10-CM | POA: Diagnosis not present

## 2017-12-03 DIAGNOSIS — L570 Actinic keratosis: Secondary | ICD-10-CM | POA: Diagnosis not present

## 2017-12-03 NOTE — Telephone Encounter (Signed)
Received a TC from patient with a question about the Parkinson Exercise.  I talked with him about the different options.  At this time Parkinson's Wellness Recovery exercise program.  I gave him the contact information for [employer].

## 2017-12-08 DIAGNOSIS — R41841 Cognitive communication deficit: Secondary | ICD-10-CM | POA: Diagnosis not present

## 2017-12-10 DIAGNOSIS — R41841 Cognitive communication deficit: Secondary | ICD-10-CM | POA: Diagnosis not present

## 2017-12-15 DIAGNOSIS — R41841 Cognitive communication deficit: Secondary | ICD-10-CM | POA: Diagnosis not present

## 2017-12-16 DIAGNOSIS — R41841 Cognitive communication deficit: Secondary | ICD-10-CM | POA: Diagnosis not present

## 2017-12-22 DIAGNOSIS — R41841 Cognitive communication deficit: Secondary | ICD-10-CM | POA: Diagnosis not present

## 2017-12-23 DIAGNOSIS — H524 Presbyopia: Secondary | ICD-10-CM | POA: Diagnosis not present

## 2017-12-23 DIAGNOSIS — H52203 Unspecified astigmatism, bilateral: Secondary | ICD-10-CM | POA: Diagnosis not present

## 2017-12-23 DIAGNOSIS — H5202 Hypermetropia, left eye: Secondary | ICD-10-CM | POA: Diagnosis not present

## 2017-12-23 DIAGNOSIS — Z961 Presence of intraocular lens: Secondary | ICD-10-CM | POA: Diagnosis not present

## 2017-12-24 DIAGNOSIS — R41841 Cognitive communication deficit: Secondary | ICD-10-CM | POA: Diagnosis not present

## 2017-12-29 DIAGNOSIS — R41841 Cognitive communication deficit: Secondary | ICD-10-CM | POA: Diagnosis not present

## 2017-12-31 ENCOUNTER — Encounter: Payer: Self-pay | Admitting: Cardiovascular Disease

## 2017-12-31 DIAGNOSIS — R41841 Cognitive communication deficit: Secondary | ICD-10-CM | POA: Diagnosis not present

## 2018-01-05 DIAGNOSIS — R41841 Cognitive communication deficit: Secondary | ICD-10-CM | POA: Diagnosis not present

## 2018-01-07 ENCOUNTER — Ambulatory Visit (INDEPENDENT_AMBULATORY_CARE_PROVIDER_SITE_OTHER): Payer: Medicare Other | Admitting: Cardiovascular Disease

## 2018-01-07 ENCOUNTER — Encounter: Payer: Self-pay | Admitting: Cardiovascular Disease

## 2018-01-07 VITALS — BP 156/80 | HR 63 | Ht 68.0 in | Wt 144.1 lb

## 2018-01-07 DIAGNOSIS — I10 Essential (primary) hypertension: Secondary | ICD-10-CM

## 2018-01-07 DIAGNOSIS — E785 Hyperlipidemia, unspecified: Secondary | ICD-10-CM

## 2018-01-07 DIAGNOSIS — I2510 Atherosclerotic heart disease of native coronary artery without angina pectoris: Secondary | ICD-10-CM

## 2018-01-07 MED ORDER — AMLODIPINE BESYLATE 10 MG PO TABS: 10.0000 mg | ORAL_TABLET | Freq: Every day | ORAL | 3 refills | Status: DC

## 2018-01-07 NOTE — Patient Instructions (Signed)
Medication Instructions:  1) INCREASE AMLODIPINE (Norvasc) to 10 mg daily  Labwork: None  Testing/Procedures: None  Follow-Up: Your provider wants you to follow-up in: 1 year with Dr. Cooper. You will receive a reminder letter in the mail two months in advance. If you don't receive a letter, please call our office to schedule the follow-up appointment.    Any Other Special Instructions Will Be Listed Below (If Applicable).     If you need a refill on your cardiac medications before your next appointment, please call your pharmacy.   

## 2018-01-07 NOTE — Progress Notes (Signed)
Cardiology Office Note Date:  01/07/2018   ID:  [name] [name], [name] [name] 03-Apr-1929, MRN [id number]  PCP:  [name] Rail, MD  Cardiologist:  [name] Mocha, MD    Chief Complaint  Patient presents with  . Follow-up    CAD     History of Present Illness: [name] [name] is a 84 y.o. male who presents for follow-up of CAD. The patient presented with unstable angina in 2005 and was treated with overlapping drug-eluting stents in the proximal LAD and a third stent in the distal LAD. He had no significant disease in his left circumflex or right coronary artery.  He has been diagnosed with Parkinson's disease since I have seen him last. Primary symptom is gait unsteadiness. He has noted some improvement on Sinemet.   The patient is here alone today. He is doing well from a cardiac perspective. Today, he denies symptoms of palpitations, shortness of breath, orthopnea, PND, lower extremity edema, dizziness, or syncope.  He does admit to episodic pain in his arm in the left upper and lower arm as well as the right upper scapular area and sometimes in the left chest.  He relates this to arthritis.  He exercises on a regular basis and denies any symptoms associated with physical activity.  Past Medical History:  Diagnosis Date  . Asthma    post infectious  . Bladder cancer [facility])    Dr [name] [name], [facility]  . CAD (coronary artery disease)    a. s/p Cypher DES x 2 to prox LAD and Cypher DES x 1 to distal LAD 08/2009;  b. cath with residual D2 50% 10/05 and normal EF;  c. Myoview 6/11: EF 60%, mild fixed INF, inferoseptal, inferoapical thinning, no ischemia, low risk, EF 60%  . Colon polyps   . GERD (gastroesophageal reflux disease)   . HLD (hyperlipidemia)    h/o elevated LFTs on Zocor  . Nephrolithiasis   . Other abnormal glucose 2012   FBS 104  . Skin cancer, basal cell    Dr [name] [name]    Past Surgical History:  Procedure Laterality Date  . BLADDER TUMOR EXCISION  2011, 2012, 2013   Dr [name] [name], [facility]  . CATARACT EXTRACTION  2003   Bilateral, [facility]  . Colonoscopy with polypectomy  2005   Dr. Verl [name]  . Coronary artery stenting  2005   Dr. Olevia [name], 3 vessel  . LITHOTRIPSY  2002  . UPPER GASTROINTESTINAL ENDOSCOPY  2005   Esophageal reflux, hiatal hernia    Current Outpatient Medications  Medication Sig Dispense Refill  . amLODipine (NORVASC) 10 MG tablet Take 1 tablet (10 mg total) by mouth daily. 90 tablet 3  . aspirin 81 MG tablet Take 81 mg by mouth daily.      . carbidopa-levodopa (SINEMET IR) 25-100 MG tablet Take 1 tablet by mouth 3 (three) times daily. 270 tablet 1  . clopidogrel (PLAVIX) 75 MG tablet TAKE 1 TABLET BY MOUTH DAILY 90 tablet 3  . fluticasone (FLONASE) 50 MCG/ACT nasal spray Place 2 sprays into both nostrils daily. --- Office visit needed for further refills 16 g 0  . Glycerin-Hypromellose-PEG 400 0.2-0.2-1 % SOLN 1 drop into each eye daily as needed for dry eye    . nitroGLYCERIN (NITROSTAT) 0.4 MG SL tablet Place 1 tablet (0.4 mg total) under the tongue every 5 (five) minutes as needed for chest pain. 25 tablet 3  . pantoprazole (PROTONIX) 20 MG tablet Take 1 tablet by mouth daily.  11  . rosuvastatin (CRESTOR) 5 MG tablet TAKE 1 TABLET BY MOUTH DAILY ON MONDAY, WEDNESDAY, AND FRIDAY. 45 tablet 0  . sildenafil (VIAGRA) 100 MG tablet Take 50 mg by mouth daily as needed for erectile dysfunction.     No current facility-administered medications for this visit.     Allergies:   Hydromorphone hcl; Phenytoin; Zocor [simvastatin - high dose]; Crestor [rosuvastatin calcium]; Hydromorphone hcl; and Statins   Social History:  The patient  reports that he quit smoking about 57 years ago. His smoking use included cigarettes. he has never used smokeless tobacco. He reports that he drinks about 6.0 oz of alcohol per week. He reports that he does not use drugs.   Family History:  The patient's family history includes Alcohol abuse in his  brother; Asthma in his brother; Coronary artery disease in his father; Diabetes in his brother; Heart attack in his mother; Heart disease in his paternal grandmother; Hypertension in his mother; Prostate cancer in his maternal grandfather; Stroke in his mother.   ROS:  Please see the history of present illness.  All other systems are reviewed and negative.   PHYSICAL EXAM: VS:  BP (!) 156/80   Pulse 63   Ht 5\' 8"  (1.727 m)   Wt 144 lb 1.9 oz (65.4 kg)   BMI 21.91 kg/m  , BMI Body mass index is 21.91 kg/m. GEN: Pleasant elderly male, in no acute distress  HEENT: normal  Neck: no JVD, no masses. No carotid bruits Cardiac: RRR without murmur or gallop     Respiratory:  clear to auscultation bilaterally, normal work of breathing GI: soft, nontender, nondistended, + BS MS: no deformity or atrophy  Ext: no pretibial edema, pedal pulses 2+= bilaterally Skin: warm and dry, no rash Neuro:  Strength and sensation are intact Psych: euthymic mood, full affect  EKG:  EKG is ordered today. The ekg ordered today shows normal sinus rhythm 63 bpm, within normal limits.  Recent Labs: 03/10/2017: Hemoglobin 13.7; Platelets 178   Lipid Panel     Component Value Date/Time   CHOL 155 07/30/2015 0737   TRIG 41.0 07/30/2015 0737   TRIG 39 10/21/2006 1059   HDL 68.70 07/30/2015 0737   CHOLHDL 2 07/30/2015 0737   VLDL 8.2 07/30/2015 0737   LDLCALC 78 07/30/2015 0737      Wt Readings from Last 3 Encounters:  01/07/18 144 lb 1.9 oz (65.4 kg)  10/15/17 146 lb (66.2 kg)  04/14/17 151 lb (68.5 kg)    ASSESSMENT AND PLAN: 1.  Coronary artery disease, native vessel, with atypical angina: The patient overall appears stable from a cardiac perspective.  He has no exertional chest pain.  He will be continued on his current medical program which includes dual antiplatelet therapy with aspirin and clopidogrel after multiple coronary stents including bifurcational PCI.  He denies bleeding problems.  I will  see him back in 1 year unless problems arise  2.  Hypertension, uncontrolled: Advised increase amlodipine to 10 mg daily.  3.  Hyperlipidemia: The patient takes Crestor 5 mg 2-3 days/week and is unable to tolerate any higher doses.  His most recent lipids are reviewed.  No changes are recommended today.  Current medicines are reviewed with the patient today.  The patient does not have concerns regarding medicines.  Labs/ tests ordered today include:   Orders Placed This Encounter  Procedures  . EKG 12-Lead    Disposition:   FU one year  Signed, [name] Mocha, MD  01/07/2018 9:09 AM    [address], [place], [place]  [address] Phone: [phone]; Fax: [phone]

## 2018-01-12 DIAGNOSIS — R41841 Cognitive communication deficit: Secondary | ICD-10-CM | POA: Diagnosis not present

## 2018-01-14 DIAGNOSIS — R41841 Cognitive communication deficit: Secondary | ICD-10-CM | POA: Diagnosis not present

## 2018-01-19 DIAGNOSIS — R41841 Cognitive communication deficit: Secondary | ICD-10-CM | POA: Diagnosis not present

## 2018-01-20 ENCOUNTER — Other Ambulatory Visit (HOSPITAL_COMMUNITY): Payer: Self-pay | Admitting: Cardiovascular Disease

## 2018-01-21 DIAGNOSIS — R41841 Cognitive communication deficit: Secondary | ICD-10-CM | POA: Diagnosis not present

## 2018-01-26 DIAGNOSIS — R41841 Cognitive communication deficit: Secondary | ICD-10-CM | POA: Diagnosis not present

## 2018-01-28 DIAGNOSIS — R41841 Cognitive communication deficit: Secondary | ICD-10-CM | POA: Diagnosis not present

## 2018-01-29 DIAGNOSIS — H353131 Nonexudative age-related macular degeneration, bilateral, early dry stage: Secondary | ICD-10-CM | POA: Diagnosis not present

## 2018-01-29 DIAGNOSIS — Z961 Presence of intraocular lens: Secondary | ICD-10-CM | POA: Diagnosis not present

## 2018-01-29 DIAGNOSIS — H35033 Hypertensive retinopathy, bilateral: Secondary | ICD-10-CM | POA: Diagnosis not present

## 2018-01-29 DIAGNOSIS — D239 Other benign neoplasm of skin, unspecified: Secondary | ICD-10-CM | POA: Diagnosis not present

## 2018-02-09 DIAGNOSIS — I10 Essential (primary) hypertension: Secondary | ICD-10-CM | POA: Diagnosis not present

## 2018-02-09 DIAGNOSIS — K219 Gastro-esophageal reflux disease without esophagitis: Secondary | ICD-10-CM | POA: Diagnosis not present

## 2018-02-09 DIAGNOSIS — G20 Parkinson's disease: Secondary | ICD-10-CM | POA: Diagnosis not present

## 2018-02-09 DIAGNOSIS — C679 Malignant neoplasm of bladder, unspecified: Secondary | ICD-10-CM | POA: Diagnosis not present

## 2018-02-09 DIAGNOSIS — Z1389 Encounter for screening for other disorder: Secondary | ICD-10-CM | POA: Diagnosis not present

## 2018-02-09 DIAGNOSIS — E7800 Pure hypercholesterolemia, unspecified: Secondary | ICD-10-CM | POA: Diagnosis not present

## 2018-02-15 NOTE — Progress Notes (Signed)
[name] [name] was seen today in the movement disorders clinic for neurologic consultation at the request of Wenda Low, MD.  The consultation is for the evaluation of gait instability for approximately 2-3 years and tremor in the right hand.  Pt states that he didn't even notice the tremor but Dr. [name] pointed it out to him.  States that balance is off especially when on uneven ground.  He feels that he is "wobbling."  He has not had falls but has had near falls.  Some paresthesias in the feet at night when wakes up but otherwise no.  No DM.  Did stop his crestor because thought that it was causing myalgia.  They just restarted but he is only taking 2 tablets a week.  He had increased LFT's on zocor The records that were made available to me were reviewed.  04/01/16 update:  The patient is following up today.  I last saw him in November.  At that point in time, I did think that some of his gait changes were from peripheral neuropathy.  I did lab work to rule out reversible causes and his B12 was low at 259.  I asked him to start taking an oral supplement and he states that he does that but forgets about 1/2 the time.  He also has a history of a parkinsonian tremor, without meeting the [place] brain bank criteria for Parkinson's disease.  That is primary reason I am following him up in 6 month intervals.   He states that he never has noted it on the L really and it doesn't bother him.  He has noted some tremor in the right leg.  He states that he did much better physically after going through PT.   I did review records since our last visit.  He recently saw cardiology on 03/27/2016.  He had atypical angina.  He is scheduled for an exercise stress Myoview.  04/14/17 update:  Pt seen today in follow up.  I haven't seen him in a year.  The records that were made available to me were reviewed.    He went through PT in the fall of last year.  States that he slipped on a wet concrete block.  He did injure his  ankle opened up the skin.  Has seen several physicians/practioners about it.  No fx to it.  He states that it has healed it.    Does c/o some word finding and memory retrieval.   States that he used to be able to do complex math and cannot do anymore.  If he sleeps better he does better.    10/15/17 update:  Pt seen in f/u for tremor.  Reports that tremor slightly worse.  It is the R arm.  Balance has not been as good but no falls.  He walks better if he is going fast.  He describes some festinating gait occasionally.  Describes inner tremor.  02/16/18 update:  Pt seen in f/u.  This patient is accompanied in the office by his spouse who supplements the history. Started on carbidopa/levodopa 25/100 tid last visit (misses lunch dosage 20% of the time but daughter put alarm on cell phone and it helps).  He gets nausea with the morning dosage.  Pt denies falls.  Pt denies lightheadedness, near syncope.  No hallucinations.  Mood has been good.  Walking for exercise or rides stationary bike for exericise.   The records that were made available to me were reviewed.  Pt has been keeping in contact with our Education officer, [employer].  ST at [email or web address] did call about her concern for memory change.  Pt previously declined neurocognitive testing.  Wife reports speech is much better.  Wife thinks that patients memory is pretty good but "he forgets some things."  Pt states still taking b12, but wife not sure.  He states that he is taking 5000 but then reports he hasn't taken it in few days!  PREVIOUS MEDICATIONS: none to date  ALLERGIES:   Allergies  Allergen Reactions  . Hydromorphone Hcl      Dilaudid Rxed for renal calculi caused angioedema   . Phenytoin Swelling  . Zocor [Simvastatin - High Dose]     Elevated hepatic enzymes  . Crestor [Rosuvastatin Calcium] Other (See Comments)    Leg pain. Not as bad since on a lower dose  . Hydromorphone Hcl      Dilaudid Rxed for renal calculi caused angioedema  . Statins     Leg  pain    CURRENT MEDICATIONS:  Outpatient Encounter Medications as of 02/16/2018  Medication Sig  . amLODipine (NORVASC) 10 MG tablet Take 1 tablet (10 mg total) by mouth daily.  Marland Kitchen aspirin 81 MG tablet Take 81 mg by mouth daily.    . carbidopa-levodopa (SINEMET IR) 25-100 MG tablet Take 1 tablet by mouth 3 (three) times daily.  . clopidogrel (PLAVIX) 75 MG tablet TAKE 1 TABLET BY MOUTH DAILY  . fluticasone (FLONASE) 50 MCG/ACT nasal spray Place 2 sprays into both nostrils daily. --- Office visit needed for further refills  . Glycerin-Hypromellose-PEG 400 0.2-0.2-1 % SOLN 1 drop into each eye daily as needed for dry eye  . nitroGLYCERIN (NITROSTAT) 0.4 MG SL tablet Place 1 tablet (0.4 mg total) under the tongue every 5 (five) minutes as needed for chest pain.  . pantoprazole (PROTONIX) 20 MG tablet Take 1 tablet by mouth daily.  . rosuvastatin (CRESTOR) 5 MG tablet TAKE 1 TABLET BY MOUTH DAILY ON MONDAY, WEDNESDAY, AND FRIDAY.  . sildenafil (VIAGRA) 100 MG tablet Take 50 mg by mouth daily as needed for erectile dysfunction.   No facility-administered encounter medications on file as of 02/16/2018.     PAST MEDICAL HISTORY:   Past Medical History:  Diagnosis Date  . Asthma    post infectious  . Bladder cancer [facility])    Dr Estill Dooms, [facility]  . CAD (coronary artery disease)    a. s/p Cypher DES x 2 to prox LAD and Cypher DES x 1 to distal LAD 08/2009;  b. cath with residual D2 50% 10/05 and normal EF;  c. Myoview 6/11: EF 60%, mild fixed INF, inferoseptal, inferoapical thinning, no ischemia, low risk, EF 60%  . Colon polyps   . GERD (gastroesophageal reflux disease)   . HLD (hyperlipidemia)    h/o elevated LFTs on Zocor  . Nephrolithiasis   . Other abnormal glucose 2012   FBS 104  . Skin cancer, basal cell    Dr Sherrye Payor    PAST SURGICAL HISTORY:   Past Surgical History:  Procedure Laterality Date  . BLADDER TUMOR EXCISION  2011, 2012, 2013   Dr Estill Dooms, [facility]  . CATARACT  EXTRACTION  2003   Bilateral, [facility] Ophth  . Colonoscopy with polypectomy  2005   Dr. Verl Blalock  . Coronary artery stenting  2005   Dr. Olevia Perches, 3 vessel  . LITHOTRIPSY  2002  . UPPER GASTROINTESTINAL ENDOSCOPY  2005   Esophageal reflux, hiatal hernia  SOCIAL HISTORY:   Social History   Socioeconomic History  . Marital status: Married    Spouse name: Not on file  . Number of children: Not on file  . Years of education: Not on file  . Highest education level: Not on file  Occupational History  . Not on file  Social Needs  . Financial resource strain: Not on file  . Food insecurity:    Worry: Not on file    Inability: Not on file  . Transportation needs:    Medical: Not on file    Non-medical: Not on file  Tobacco Use  . Smoking status: Former Smoker    Types: Cigarettes    Last attempt to quit: 11/17/1960    Years since quitting: 57.2  . Smokeless tobacco: Never Used  . Tobacco comment: smoked 1948-1962, up to 1/2 ppd  Substance and Sexual Activity  . Alcohol use: Yes    Alcohol/week: 6.0 oz    Types: 10 Glasses of wine per week    Comment: wine @ dinner  . Drug use: No  . Sexual activity: Not on file  Lifestyle  . Physical activity:    Days per week: Not on file    Minutes per session: Not on file  . Stress: Not on file  Relationships  . Social connections:    Talks on phone: Not on file    Gets together: Not on file    Attends religious service: Not on file    Active member of club or organization: Not on file    Attends meetings of clubs or organizations: Not on file    Relationship status: Not on file  . Intimate partner violence:    Fear of current or ex partner: Not on file    Emotionally abused: Not on file    Physically abused: Not on file    Forced sexual activity: Not on file  Other Topics Concern  . Not on file  Social History Narrative  . Not on file    FAMILY HISTORY:   Family Status  Relation Name Status  . Mother  Deceased at age  39       stroke  . Father  Deceased       heart disease  . PGM  Deceased  . Brother  Deceased       ETOH, depression  . Brother  Alive       healthy  . Brother  Alive       healthy  . Sister  Alive       healthy  . Child  Alive       2, alive and well  . Brother  (Not Specified)  . Brother Pilar Plate (Not Specified)  . MGF  Deceased  . MGM  Deceased  . PGF  Deceased    ROS:   A complete 10 system review of systems was obtained and was unremarkable apart from what is mentioned above.  PHYSICAL EXAMINATION:    VITALS:   Vitals:   02/16/18 1446  BP: 138/66  Pulse: 70  SpO2: 97%  Weight: 148 lb (67.1 kg)  Height: 5\' 4"  (1.626 m)    GEN:  The patient appears stated age and is in NAD. HEENT:  Normocephalic, atraumatic.  The mucous membranes are moist. The superficial temporal arteries are without ropiness or tenderness. CV:  RRR Lungs:  CTAB Neck/HEME:  There are no carotid bruits bilaterally.  Neurological examination:  Orientation:  Montreal Cognitive Assessment  02/16/2018  Visuospatial/ Executive (0/5) 0  Naming (0/3) 3  Attention: Read list of digits (0/2) 2  Attention: Read list of letters (0/1) 1  Attention: Serial 7 subtraction starting at 100 (0/3) 1  Language: Repeat phrase (0/2) 1  Language : Fluency (0/1) 0  Abstraction (0/2) 1  Delayed Recall (0/5) 0  Orientation (0/6) 6  Total 15  Adjusted Score (based on education) 15   Cranial nerves: There is good facial symmetry.  The speech is fluent and clear. Soft palate rises symmetrically and there is no tongue deviation. Hearing is intact to conversational tone. Sensation: Sensation is intact to light intact.   Motor: Strength is 5/5 in the bilateral upper and lower extremities.   Shoulder shrug is equal and symmetric.  There is no pronator drift.   Movement examination: Tone: There is mild increased tone in the RUE Abnormal movements: There is no tremor today Coordination:  There is no decremation, with  any form of RAMS, including alternating supination and pronation of the forearm, hand opening and closing, finger taps, heel taps and toe taps bilaterally Gait and Station: The patient has no difficulty arising out of a deep-seated chair without the use of the hands. The patient's stride length is mildly decreased.    Lab Results  Component Value Date   VITAMINB12 399 04/01/2016    Lab Results  Component Value Date   TSH 3.75 09/18/2015   Lab Results  Component Value Date   HGBA1C 4.7 09/18/2015     Chemistry      Component Value Date/Time   NA 142 09/18/2015 1556   K 3.5 09/18/2015 1556   CL 107 09/18/2015 1556   CO2 28 09/18/2015 1556   BUN 18 09/18/2015 1556   CREATININE 1.01 09/18/2015 1556      Component Value Date/Time   CALCIUM 9.1 09/18/2015 1556   ALKPHOS 116 07/30/2015 0737   AST 20 07/30/2015 0737   ALT 20 07/30/2015 0737   BILITOT 0.8 07/30/2015 0737       ASSESSMENT/PLAN:  1.  Gait instability, in part due to PN  -labs for reversible causes of PN negative, except mild B12 deficiency 2.  Parkinson's disease  -told him to take AM med with carb to help avoid nausea.  Continue carbidopa/levodopa 25/100 tid  -wife present today and she asked many questions and answered to best of my ability  -was going to do labs but reports that had them done at [place] so will call there 3.  B12 deficiency  -Continue supplement.    -was going to do B12 level but will see if had done at [facility] 4.  Word finding trouble/memory loss  -His MoCA was much lower than expected but he was rushing through it trying to predict questions.  Talked to him about neurocognitive testing but he wants to hold on that.  Talked to them about meds for memory loss but didn't want them.  Will readdress at future visits. 5.  Follow up is anticipated in the next few months, sooner should new neurologic issues arise.  Much greater than 50% of this visit was spent in counseling and coordinating care.   Total face to face time:  35 min

## 2018-02-16 ENCOUNTER — Ambulatory Visit (INDEPENDENT_AMBULATORY_CARE_PROVIDER_SITE_OTHER): Payer: Medicare Other | Admitting: Neurology

## 2018-02-16 ENCOUNTER — Encounter: Payer: Self-pay | Admitting: Neurology

## 2018-02-16 VITALS — BP 138/66 | HR 70 | Ht 64.0 in | Wt 148.0 lb

## 2018-02-16 DIAGNOSIS — E538 Deficiency of other specified B group vitamins: Secondary | ICD-10-CM | POA: Diagnosis not present

## 2018-02-16 DIAGNOSIS — G20 Parkinson's disease: Secondary | ICD-10-CM

## 2018-02-16 DIAGNOSIS — I2510 Atherosclerotic heart disease of native coronary artery without angina pectoris: Secondary | ICD-10-CM

## 2018-02-16 DIAGNOSIS — G20A1 Parkinson's disease without dyskinesia, without mention of fluctuations: Secondary | ICD-10-CM | POA: Insufficient documentation

## 2018-02-16 DIAGNOSIS — R413 Other amnesia: Secondary | ICD-10-CM | POA: Diagnosis not present

## 2018-02-16 HISTORY — DX: Parkinson's disease without dyskinesia, without mention of fluctuations: G20.A1

## 2018-02-16 HISTORY — DX: Parkinson's disease: G20

## 2018-02-16 NOTE — Patient Instructions (Addendum)
1.  Continue your carbidopa/levodopa 25/100 three times per day  2. You have been referred for a neurocognitive evaluation in our office.   The evaluation takes approximately two hours. The first part of the appointment is a clinical interview with the neuropsychologist (Dr. Macarthur Critchley). Please bring someone with you to this appointment if possible, as it is helpful for Dr. Si Raider to hear from both you and another adult who knows you well. After speaking with Dr. Si Raider, you will complete testing with her technician. The testing includes a variety of tasks- mostly question-and-answer, some paper-and-pencil. There is nothing you need to do to prepare for this appointment, but having a good night's sleep prior to the testing, and bringing eyeglasses and hearing aids (if you wear them), is advised.   About a week after the evaluation, you will return to follow up with Dr. Si Raider to review the test results. This appointment is about 30 minutes. If you would like a family member to receive this information as well, please bring them to the appointment.   We have to reserve several hours of the neuropsychologist's time and the psychometrician's time for your evaluation appointment. As such, please note that there is a No-Show fee of $100. If you are unable to attend any of your appointments, please contact our office as soon as possible to reschedule.       [facility] [employer] [facility] Movement [facility]  The [place] [facility] team know that living well with a movement disorder extends far beyond our clinic walls. We are together with you. Our team is passionate about providing resources to you and your loved ones who are living with Parkinson's disease and movement disorders. Participate in these programs and join our community. These resources are free or low cost!   [facility] [facility] is adding:   Innovative educational  programs for patients and caregivers.   Support groups for patients and caregivers living with Parkinson's disease.   Parkinson's specific exercise programs.   Custom tailored therapeutic programs that will benefit patient's living with Parkinson's disease.   We are in this together. You can help and contribute to grow these programs and resources in our community. 100% of the funds donated to the [facility] stays right here in our community to support patients and their caregivers.  To make a tax deductible contribution:  -ask for a [facility]'s envelope in the office today.  - call the Office of Institutional Advancement at [phone].

## 2018-03-01 DIAGNOSIS — H6123 Impacted cerumen, bilateral: Secondary | ICD-10-CM | POA: Diagnosis not present

## 2018-03-09 ENCOUNTER — Ambulatory Visit
Admission: RE | Admit: 2018-03-09 | Discharge: 2018-03-09 | Disposition: A | Discharge status: Home / Self Care | Payer: Medicare Other | Source: Ambulatory Visit | Attending: Internal Medicine | Admitting: Internal Medicine

## 2018-03-09 ENCOUNTER — Other Ambulatory Visit: Payer: Self-pay | Admitting: Internal Medicine

## 2018-03-09 DIAGNOSIS — R609 Edema, unspecified: Secondary | ICD-10-CM

## 2018-03-09 DIAGNOSIS — L539 Erythematous condition, unspecified: Secondary | ICD-10-CM

## 2018-03-09 DIAGNOSIS — R600 Localized edema: Secondary | ICD-10-CM | POA: Diagnosis not present

## 2018-03-09 DIAGNOSIS — M7989 Other specified soft tissue disorders: Secondary | ICD-10-CM | POA: Diagnosis not present

## 2018-03-09 DIAGNOSIS — L03119 Cellulitis of unspecified part of limb: Secondary | ICD-10-CM | POA: Diagnosis not present

## 2018-03-17 DIAGNOSIS — R600 Localized edema: Secondary | ICD-10-CM | POA: Diagnosis not present

## 2018-03-18 ENCOUNTER — Telehealth: Payer: Self-pay | Admitting: Emergency Medicine

## 2018-03-18 NOTE — Telephone Encounter (Signed)
Called to schedule patients AWV. Spoke to patients spouse and she will have patient call back.

## 2018-03-30 DIAGNOSIS — R600 Localized edema: Secondary | ICD-10-CM | POA: Diagnosis not present

## 2018-04-21 ENCOUNTER — Encounter (INDEPENDENT_AMBULATORY_CARE_PROVIDER_SITE_OTHER): Payer: Self-pay | Admitting: Orthopaedic Surgery

## 2018-04-21 ENCOUNTER — Ambulatory Visit (INDEPENDENT_AMBULATORY_CARE_PROVIDER_SITE_OTHER): Payer: Medicare Other | Admitting: Orthopaedic Surgery

## 2018-04-21 DIAGNOSIS — M5432 Sciatica, left side: Secondary | ICD-10-CM

## 2018-04-21 DIAGNOSIS — I2510 Atherosclerotic heart disease of native coronary artery without angina pectoris: Secondary | ICD-10-CM | POA: Diagnosis not present

## 2018-04-21 MED ORDER — METHYLPREDNISOLONE 4 MG PO TABS: ORAL_TABLET | ORAL | 0 refills | Status: DC

## 2018-04-21 NOTE — Progress Notes (Signed)
Office Visit Note   Patient: [name] [name]           Date of Birth: [birth year]           MRN: [id number] Visit Date: 04/21/2018              Requested by: Wenda Low, MD [address] [place], [place] [address] PCP: Wenda Low, MD   Assessment & Plan: Visit Diagnoses:  1. Sciatica, left side     Plan: The patient does seem to have left-sided sciatica.  Hopefully he will do well with just time and a steroid taper.  He will work on back extension exercises as well.  I will see him back in 2 weeks to see how is doing overall.  If he still having problems with his lumbar spine and sciatic I would like to have an AP and lateral lumbar spine and then likely obtain an MRI if warranted.  All questions concerns were answered and addressed.  Follow-Up Instructions: Return in about 2 weeks (around 05/05/2018).   Orders:  No orders of the defined types were placed in this encounter.  Meds ordered this encounter  Medications  . methylPREDNISolone (MEDROL) 4 MG tablet    Sig: Medrol dose pack. Take as instructed    Dispense:  21 tablet    Refill:  0      Procedures: No procedures performed   Clinical Data: No additional findings.   Subjective: Chief Complaint  Patient presents with  . Left Hip - Pain  The patient is a very pleasant 84 year old gentleman comes in today with acute left-sided low back pain and sciatica.  He says that she is feeling better today and the pain comes across his low back and does radiate to his buttocks area.  It does not go past the knee.  He does have a history of Parkinson's disease he said today's not as bad but for 6 days he was having a significant problem especially with bending over to tie his shoes.  He denies any change in bowel bladder function.  Denies any weakness in his legs.  HPI  Review of Systems He currently denies any headache, chest pain, shortness of breath, fever, chills, nausea,  vomiting.  Objective: Vital Signs: There were no vitals taken for this visit.  Physical Exam He is alert and oriented x3 and in no acute distress Ortho Exam Examination of his bilateral lower extremity shows normal range of motion of both hips knees feet and ankle.  He does not have any significant weakness but he does have a positive straight leg raise on the left side and on the right.  He does have pain with flexion extension of the lumbar spine. Specialty Comments:  No specialty comments available.  Imaging: No results found.   PMFS History: Patient Active Problem List   Diagnosis Date Noted  . Parkinson's disease ([place]) 02/16/2018  . Rash 02/12/2017  . Ankle swelling, right 02/12/2017  . Essential hypertension, benign 06/16/2012  . CAD (coronary artery disease) 05/27/2011  . Bladder cancer ([place]) 05/27/2011  . Osteopenia 05/27/2011  . SKIN CANCER, HX OF 02/28/2009  . COLONIC POLYPS, HX OF 02/28/2009  . ELEVATED PROSTATE SPECIFIC ANTIGEN 03/28/2008  . Hyperlipidemia 01/31/2008  . HYPERPLASIA PROSTATE UNS W/O UR OBST & OTH LUTS 01/31/2008  . IMPOTENCE OF ORGANIC ORIGIN 01/31/2008  . Asthma 09/28/2007  . GERD 07/22/2007  . NEPHROLITHIASIS, HX OF 07/22/2007   Past Medical History:  Diagnosis Date  .  Asthma    post infectious  . Bladder cancer [facility])    Dr Estill Dooms, [facility]  . CAD (coronary artery disease)    a. s/p Cypher DES x 2 to prox LAD and Cypher DES x 1 to distal LAD 08/2009;  b. cath with residual D2 50% 10/05 and normal EF;  c. Myoview 6/11: EF 60%, mild fixed INF, inferoseptal, inferoapical thinning, no ischemia, low risk, EF 60%  . Colon polyps   . GERD (gastroesophageal reflux disease)   . HLD (hyperlipidemia)    h/o elevated LFTs on Zocor  . Nephrolithiasis   . Other abnormal glucose 2012   FBS 104  . Skin cancer, basal cell    Dr Sherrye Payor    Family History  Problem Relation Age of Onset  . Hypertension Mother   . Stroke Mother        in 42s  .  Heart attack Mother        67  . Coronary artery disease Father        54s  . Heart disease Paternal Grandmother        in 23s  . Asthma Brother   . Diabetes Brother   . Alcohol abuse Brother   . Prostate cancer Maternal Grandfather     Past Surgical History:  Procedure Laterality Date  . BLADDER TUMOR EXCISION  2011, 2012, 2013   Dr Estill Dooms, [facility]  . CATARACT EXTRACTION  2003   Bilateral, [facility] Ophth  . Colonoscopy with polypectomy  2005   Dr. Verl Blalock  . Coronary artery stenting  2005   Dr. Olevia Perches, 3 vessel  . LITHOTRIPSY  2002  . UPPER GASTROINTESTINAL ENDOSCOPY  2005   Esophageal reflux, hiatal hernia   Social History   Occupational History  . Not on file  Tobacco Use  . Smoking status: Former Smoker    Types: Cigarettes    Last attempt to quit: 11/17/1960    Years since quitting: 57.4  . Smokeless tobacco: Never Used  . Tobacco comment: smoked 1948-1962, up to 1/2 ppd  Substance and Sexual Activity  . Alcohol use: Yes    Alcohol/week: 6.0 oz    Types: 10 Glasses of wine per week    Comment: wine @ dinner  . Drug use: No  . Sexual activity: Not on file

## 2018-05-05 ENCOUNTER — Ambulatory Visit (INDEPENDENT_AMBULATORY_CARE_PROVIDER_SITE_OTHER): Payer: Medicare Other | Admitting: Orthopaedic Surgery

## 2018-05-05 DIAGNOSIS — R600 Localized edema: Secondary | ICD-10-CM | POA: Diagnosis not present

## 2018-05-05 DIAGNOSIS — I10 Essential (primary) hypertension: Secondary | ICD-10-CM | POA: Diagnosis not present

## 2018-05-13 ENCOUNTER — Other Ambulatory Visit: Payer: Self-pay | Admitting: Neurology

## 2018-05-27 DIAGNOSIS — Z8551 Personal history of malignant neoplasm of bladder: Secondary | ICD-10-CM | POA: Diagnosis not present

## 2018-05-27 DIAGNOSIS — N401 Enlarged prostate with lower urinary tract symptoms: Secondary | ICD-10-CM | POA: Diagnosis not present

## 2018-05-27 DIAGNOSIS — N138 Other obstructive and reflux uropathy: Secondary | ICD-10-CM | POA: Diagnosis not present

## 2018-06-02 DIAGNOSIS — N400 Enlarged prostate without lower urinary tract symptoms: Secondary | ICD-10-CM | POA: Diagnosis not present

## 2018-06-02 DIAGNOSIS — I2510 Atherosclerotic heart disease of native coronary artery without angina pectoris: Secondary | ICD-10-CM | POA: Diagnosis not present

## 2018-06-02 DIAGNOSIS — I10 Essential (primary) hypertension: Secondary | ICD-10-CM | POA: Diagnosis not present

## 2018-06-14 ENCOUNTER — Telehealth: Payer: Self-pay | Admitting: Psychology

## 2018-06-14 NOTE — Telephone Encounter (Signed)
TC from patient. He is interested in [employer] so I provided him with the number for the [facility] program.

## 2018-07-12 DIAGNOSIS — S61219A Laceration without foreign body of unspecified finger without damage to nail, initial encounter: Secondary | ICD-10-CM | POA: Diagnosis not present

## 2018-07-12 DIAGNOSIS — M79645 Pain in left finger(s): Secondary | ICD-10-CM | POA: Diagnosis not present

## 2018-07-15 ENCOUNTER — Encounter: Payer: Medicare Other | Admitting: Psychology

## 2018-07-15 NOTE — Progress Notes (Deleted)
NEUROBEHAVIORAL STATUS EXAM   Name: [name] [name] Date of Birth: August 07, 1929 Date of Interview: 07/15/2018  Reason for Referral:  [name] [name] is a 84 y.o. male who is referred for neuropsychological evaluation by Dr. Wells Guiles Tat of [facility] due to concerns about memory loss. This patient is accompanied in the office by his *** who supplements the history.  History of Presenting Problem:  Tat first saw on 09/20/2015 evaluation of gait instability for approximately 2-3 years and tremor in the right hand.  Pt states that he didn't even notice the tremor but Dr. [name] pointed it out to him.  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   03/2017   Does c/o some word finding and memory retrieval.   States that he used to be able to do complex math and cannot do anymore.  If he sleeps better he does better.    09/2017 now meeting criteria for mild PD Word finding trouble/memory loss             -likely MCI.  I see no evidence of a neurodegenerative dementia process.  Started on carbidopa/levodopa 25/100 tid last visit (misses lunch dosage 20% of the time but daughter put alarm on cell phone and it helps)  02/2018 MoCA 15/30 ST at [facility] did call about her concern for memory change.  Pt previously declined neurocognitive testing.  Wife reports speech is much better.  Wife thinks that patients memory is pretty good but "he forgets some things."  Word finding trouble/memory loss             -His MoCA was much lower than expected but he was rushing through it trying to predict questions.  Talked to him about neurocognitive testing but he wants to hold on that.  Talked to them about meds for memory loss but didn't want them.  Will readdress at future visits.    Onset/Course  Upon direct questioning, the patient/caregiver reported:   Forgetting recent conversations/events:  Repeating statements/questions: Misplacing/losing items: Forgetting appointments or other  obligations: Forgetting to take medications:  Difficulty concentrating: Starting but not finishing tasks: Distracted easily: Processing information more slowly:  Word-finding difficulty: Word substitutions: Writing difficulty: Spelling difficulty: Comprehension difficulty:  Getting lost when driving: Making wrong turns when driving: Uncertain about directions when driving or passenger:    Family neuro hx: Any family hx dementia?   Current Functioning: Work:  Complex ADLs Driving: Medication management: Management of finances: Appointments: Cooking:     Medical/Physical complaints:  Any hx of stroke/TIA, MI, LOC/TBI, Sz? Hx falls?  Balance, probs walking? Sleep: Insomnia? OSA? CPAP? REM sleep beh sx? Visual illusions/hallucinations? Appetite/Nutrition/Weight changes       Current mood:  Depressed mood Anxiety Stress  Behavioral disturbance/Personality change  Suicidal Ideation/Intention:   Psychiatric History: History of depression, anxiety, other MH disorder: History of MH treatment: History of SI: History of substance dependence/treatment:   Social History: Born/Raised:  Education: Occupational history: Marital history: Children: Alcohol:  Tobacco: SA:  Medical History: Past Medical History:  Diagnosis Date  . Asthma    post infectious  . Bladder cancer [facility])    Dr Estill Dooms, [facility]  . CAD (coronary artery disease)    a. s/p Cypher DES x 2 to prox LAD and Cypher DES x 1 to distal LAD 08/2009;  b. cath with residual D2 50% 10/05 and normal EF;  c. Myoview 6/11: EF 60%, mild fixed INF, inferoseptal, inferoapical thinning, no ischemia, low risk, EF 60%  .  Colon polyps   . GERD (gastroesophageal reflux disease)   . HLD (hyperlipidemia)    h/o elevated LFTs on Zocor  . Nephrolithiasis   . Other abnormal glucose 2012   FBS 104  . Skin cancer, basal cell    Dr Sherrye Payor      Current Medications:  Outpatient Encounter  Medications as of 07/15/2018  Medication Sig  . amLODipine (NORVASC) 10 MG tablet Take 1 tablet (10 mg total) by mouth daily.  Marland Kitchen aspirin 81 MG tablet Take 81 mg by mouth daily.    . carbidopa-levodopa (SINEMET IR) 25-100 MG tablet TAKE 1 TABLET BY MOUTH THREE TIMES DAILY  . clopidogrel (PLAVIX) 75 MG tablet TAKE 1 TABLET BY MOUTH DAILY  . doxycycline (VIBRA-TABS) 100 MG tablet TK 1 T PO BID FOR 10 DAYS  . fluticasone (FLONASE) 50 MCG/ACT nasal spray Place 2 sprays into both nostrils daily. --- Office visit needed for further refills  . furosemide (LASIX) 20 MG tablet TK 2 TS PO ONCE A DAY  . Glycerin-Hypromellose-PEG 400 0.2-0.2-1 % SOLN 1 drop into each eye daily as needed for dry eye  . methylPREDNISolone (MEDROL) 4 MG tablet Medrol dose pack. Take as instructed  . nitroGLYCERIN (NITROSTAT) 0.4 MG SL tablet Place 1 tablet (0.4 mg total) under the tongue every 5 (five) minutes as needed for chest pain.  . pantoprazole (PROTONIX) 20 MG tablet Take 1 tablet by mouth daily.  . potassium chloride (K-DUR) 10 MEQ tablet TK 1 T PO ONCE A DAY WF  . rosuvastatin (CRESTOR) 5 MG tablet TAKE 1 TABLET BY MOUTH DAILY ON MONDAY, WEDNESDAY, AND FRIDAY.  . sildenafil (VIAGRA) 100 MG tablet Take 50 mg by mouth daily as needed for erectile dysfunction.   No facility-administered encounter medications on file as of 07/15/2018.      Behavioral Observations:   Appearance: Neatly, casually and appropriately dressed and groomed*** Gait: Ambulated independently, no gross abnormalities observed*** Speech: Fluent; normal rate, rhythm and volume. *** word finding difficulty. Thought process: Linear, goal directed*** Affect: Full, anxious*** Interpersonal: Pleasant, appropriate***   *** minutes spent face-to-face with patient completing neurobehavioral status exam. *** minutes spent integrating medical records/clinical data and completing this report. [id number] unit; [id number]***.   TESTING: There is medical  necessity to proceed with neuropsychological assessment as the results will be used to aid in differential diagnosis and clinical decision-making and to inform specific treatment recommendations. Per the patient, *** and medical records reviewed, there has been a change in cognitive functioning and a reasonable suspicion of dementia***.  Clinical Decision Making: In considering the patient's current level of functioning, level of presumed impairment, nature of symptoms, emotional and behavioral responses during the interview, level of literacy, and observed level of motivation, a battery of tests was selected and communicated to the psychometrician.   ***Option 1:  Following the clinical interview/neurobehavioral status exam, the patient completed this full battery of neuropsychological testing with my psychometrician under my supervision (see separate note).   PLAN: The patient will return to see me for a follow-up session at which time his test performances and my impressions and treatment recommendations will be reviewed in detail.  Evaluation ongoing; full report to follow.  ***Option 2:  PLAN: The patient will return to complete the above referenced full battery of neuropsychological testing with a psychometrician under my supervision. Education regarding testing procedures was provided to the patient. Subsequently, the patient will see this provider for a follow-up session at which time his test performances and my  impressions and treatment recommendations will be reviewed in detail.  Evaluation ongoing; full report to follow.

## 2018-07-21 NOTE — Progress Notes (Addendum)
[name] [name] was seen today in the movement disorders clinic for neurologic consultation at the request of Wenda Low, MD.  The consultation is for the evaluation of gait instability for approximately 2-3 years and tremor in the right hand.  Pt states that he didn't even notice the tremor but Dr. [name] pointed it out to him.  States that balance is off especially when on uneven ground.  He feels that he is "wobbling."  He has not had falls but has had near falls.  Some paresthesias in the feet at night when wakes up but otherwise no.  No DM.  Did stop his crestor because thought that it was causing myalgia.  They just restarted but he is only taking 2 tablets a week.  He had increased LFT's on zocor The records that were made available to me were reviewed.  04/01/16 update:  The patient is following up today.  I last saw him in November.  At that point in time, I did think that some of his gait changes were from peripheral neuropathy.  I did lab work to rule out reversible causes and his B12 was low at 259.  I asked him to start taking an oral supplement and he states that he does that but forgets about 1/2 the time.  He also has a history of a parkinsonian tremor, without meeting the [place] brain bank criteria for Parkinson's disease.  That is primary reason I am following him up in 6 month intervals.   He states that he never has noted it on the L really and it doesn't bother him.  He has noted some tremor in the right leg.  He states that he did much better physically after going through PT.   I did review records since our last visit.  He recently saw cardiology on 03/27/2016.  He had atypical angina.  He is scheduled for an exercise stress Myoview.  04/14/17 update:  Pt seen today in follow up.  I haven't seen him in a year.  The records that were made available to me were reviewed.    He went through PT in the fall of last year.  States that he slipped on a wet concrete block.  He did injure his  ankle opened up the skin.  Has seen several physicians/practioners about it.  No fx to it.  He states that it has healed it.    Does c/o some word finding and memory retrieval.   States that he used to be able to do complex math and cannot do anymore.  If he sleeps better he does better.    10/15/17 update:  Pt seen in f/u for tremor.  Reports that tremor slightly worse.  It is the R arm.  Balance has not been as good but no falls.  He walks better if he is going fast.  He describes some festinating gait occasionally.  Describes inner tremor.  02/16/18 update:  Pt seen in f/u.  This patient is accompanied in the office by his spouse who supplements the history. Started on carbidopa/levodopa 25/100 tid last visit (misses lunch dosage 20% of the time but daughter put alarm on cell phone and it helps).  He gets nausea with the morning dosage.  Pt denies falls.  Pt denies lightheadedness, near syncope.  No hallucinations.  Mood has been good.  Walking for exercise or rides stationary bike for exericise.   The records that were made available to me were reviewed.  Pt has been keeping in contact with our Education officer, museum.  ST at [facility] did call about her concern for memory change.  Pt previously declined neurocognitive testing.  Wife reports speech is much better.  Wife thinks that patients memory is pretty good but "he forgets some things."  Pt states still taking b12, but wife not sure.  He states that he is taking 5000 but then reports he hasn't taken it in few days!  07/22/18 update: Patient is seen today in follow-up for Parkinson's disease.  He is on carbidopa/levodopa 25/100, 1 tablet 3 times per day.  No falls.  No lightheadedness or near syncope.  He saw [facility] for sciatica on the left in the beginning of June and he was given a steroid Dosepak.  His pain went away.  He is under a lot of stress.  His wife had 2 strokes.  He is wanting to get back to playing tennis and asks me about that.  "I  have never fallen."  PREVIOUS MEDICATIONS: none to date  ALLERGIES:   Allergies  Allergen Reactions  . Hydromorphone Hcl      Dilaudid Rxed for renal calculi caused angioedema   . Phenytoin Swelling  . Zocor [Simvastatin - High Dose]     Elevated hepatic enzymes  . Crestor [Rosuvastatin Calcium] Other (See Comments)    Leg pain. Not as bad since on a lower dose  . Hydromorphone Hcl      Dilaudid Rxed for renal calculi caused angioedema  . Statins     Leg pain    CURRENT MEDICATIONS:  Outpatient Encounter Medications as of 07/22/2018  Medication Sig  . amLODipine (NORVASC) 10 MG tablet Take 1 tablet (10 mg total) by mouth daily.  Marland Kitchen aspirin 81 MG tablet Take 81 mg by mouth daily.    . carbidopa-levodopa (SINEMET IR) 25-100 MG tablet Take 1 tablet by mouth 3 (three) times daily.  . clopidogrel (PLAVIX) 75 MG tablet TAKE 1 TABLET BY MOUTH DAILY  . fluticasone (FLONASE) 50 MCG/ACT nasal spray Place 2 sprays into both nostrils daily. --- Office visit needed for further refills  . furosemide (LASIX) 20 MG tablet TK 2 TS PO ONCE A DAY  . Glycerin-Hypromellose-PEG 400 0.2-0.2-1 % SOLN 1 drop into each eye daily as needed for dry eye  . methylPREDNISolone (MEDROL) 4 MG tablet Medrol dose pack. Take as instructed  . pantoprazole (PROTONIX) 20 MG tablet Take 1 tablet by mouth daily.  . potassium chloride (K-DUR) 10 MEQ tablet TK 1 T PO ONCE A DAY WF  . rosuvastatin (CRESTOR) 5 MG tablet TAKE 1 TABLET BY MOUTH DAILY ON MONDAY, WEDNESDAY, AND FRIDAY.  . sildenafil (VIAGRA) 100 MG tablet Take 50 mg by mouth daily as needed for erectile dysfunction.  . [DISCONTINUED] carbidopa-levodopa (SINEMET IR) 25-100 MG tablet TAKE 1 TABLET BY MOUTH THREE TIMES DAILY  . nitroGLYCERIN (NITROSTAT) 0.4 MG SL tablet Place 1 tablet (0.4 mg total) under the tongue every 5 (five) minutes as needed for chest pain. (Patient not taking: Reported on 07/22/2018)  . [DISCONTINUED] doxycycline (VIBRA-TABS) 100 MG tablet TK  1 T PO BID FOR 10 DAYS   No facility-administered encounter medications on file as of 07/22/2018.     PAST MEDICAL HISTORY:   Past Medical History:  Diagnosis Date  . Asthma    post infectious  . Bladder cancer [facility])    Dr Estill Dooms, [facility]  . CAD (coronary artery disease)    a. s/p Cypher DES x  2 to prox LAD and Cypher DES x 1 to distal LAD 08/2009;  b. cath with residual D2 50% 10/05 and normal EF;  c. Myoview 6/11: EF 60%, mild fixed INF, inferoseptal, inferoapical thinning, no ischemia, low risk, EF 60%  . Colon polyps   . GERD (gastroesophageal reflux disease)   . HLD (hyperlipidemia)    h/o elevated LFTs on Zocor  . Nephrolithiasis   . Other abnormal glucose 2012   FBS 104  . Skin cancer, basal cell    Dr Sherrye Payor    PAST SURGICAL HISTORY:   Past Surgical History:  Procedure Laterality Date  . BLADDER TUMOR EXCISION  2011, 2012, 2013   Dr Estill Dooms, [facility]  . CATARACT EXTRACTION  2003   Bilateral, [facility]  . Colonoscopy with polypectomy  2005   Dr. Verl Blalock  . Coronary artery stenting  2005   Dr. Olevia Perches, 3 vessel  . LITHOTRIPSY  2002  . UPPER GASTROINTESTINAL ENDOSCOPY  2005   Esophageal reflux, hiatal hernia    SOCIAL HISTORY:   Social History   Socioeconomic History  . Marital status: Married    Spouse name: Not on file  . Number of children: Not on file  . Years of education: Not on file  . Highest education level: Not on file  Occupational History  . Not on file  Social Needs  . Financial resource strain: Not on file  . Food insecurity:    Worry: Not on file    Inability: Not on file  . Transportation needs:    Medical: Not on file    Non-medical: Not on file  Tobacco Use  . Smoking status: Former Smoker    Types: Cigarettes    Last attempt to quit: 11/17/1960    Years since quitting: 57.7  . Smokeless tobacco: Never Used  . Tobacco comment: smoked 1948-1962, up to 1/2 ppd  Substance and Sexual Activity  . Alcohol use: Yes     Alcohol/week: 10.0 standard drinks    Types: 10 Glasses of wine per week    Comment: wine @ dinner  . Drug use: No  . Sexual activity: Not on file  Lifestyle  . Physical activity:    Days per week: Not on file    Minutes per session: Not on file  . Stress: Not on file  Relationships  . Social connections:    Talks on phone: Not on file    Gets together: Not on file    Attends religious service: Not on file    Active member of club or organization: Not on file    Attends meetings of clubs or organizations: Not on file    Relationship status: Not on file  . Intimate partner violence:    Fear of current or ex partner: Not on file    Emotionally abused: Not on file    Physically abused: Not on file    Forced sexual activity: Not on file  Other Topics Concern  . Not on file  Social History Narrative  . Not on file    FAMILY HISTORY:   Family Status  Relation Name Status  . Mother  Deceased at age 60       stroke  . Father  Deceased       heart disease  . PGM  Deceased  . Brother  Deceased       ETOH, depression  . Brother  Alive       healthy  . Brother  Alive       healthy  . Sister  Alive       healthy  . Child  Alive       2, alive and well  . Brother  (Not Specified)  . Brother Pilar Plate (Not Specified)  . MGF  Deceased  . MGM  Deceased  . PGF  Deceased    ROS:  Review of Systems  Constitutional: Negative.   HENT: Negative.   Eyes: Negative.   Respiratory: Negative.   Cardiovascular: Negative.   Gastrointestinal: Negative.   Genitourinary: Negative.   Musculoskeletal: Negative.   Skin: Negative.      PHYSICAL EXAMINATION:    VITALS:   Vitals:   07/22/18 1518  BP: (!) 142/60  Pulse: 68  SpO2: 95%  Weight: 142 lb (64.4 kg)  Height: 5\' 6"  (1.676 m)    GEN:  The patient appears stated age and is in NAD. HEENT:  Normocephalic, atraumatic.  The mucous membranes are moist. The superficial temporal arteries are without ropiness or tenderness. CV:   RRR Lungs:  CTAB Neck/HEME:  There are no carotid bruits bilaterally.  Neurological examination:  Orientation:  Montreal Cognitive Assessment  02/16/2018  Visuospatial/ Executive (0/5) 0  Naming (0/3) 3  Attention: Read list of digits (0/2) 2  Attention: Read list of letters (0/1) 1  Attention: Serial 7 subtraction starting at 100 (0/3) 1  Language: Repeat phrase (0/2) 1  Language : Fluency (0/1) 0  Abstraction (0/2) 1  Delayed Recall (0/5) 0  Orientation (0/6) 6  Total 15  Adjusted Score (based on education) 15   Cranial nerves: There is good facial symmetry.  The speech is fluent and clear. Soft palate rises symmetrically and there is no tongue deviation. Hearing is intact to conversational tone. Sensation: Sensation is intact to light intact.   Motor: Strength is 5/5 in the bilateral upper and lower extremities.   Shoulder shrug is equal and symmetric.  There is no pronator drift.   Movement examination: Tone: There is mild increased tone in the RUE Abnormal movements: There is no tremor today Coordination:  There is no decremation, with any form of RAMS, including alternating supination and pronation of the forearm, hand opening and closing, finger taps, heel taps and toe taps bilaterally Gait and Station: The patient has no difficulty arising out of a deep-seated chair without the use of the hands. The patient's stride length is mildly decreased.  He is just slightly unbalanced in the turns.  Lab Results  Component Value Date   VITAMINB12 399 04/01/2016    Lab Results  Component Value Date   TSH 3.75 09/18/2015   Lab Results  Component Value Date   HGBA1C 4.7 09/18/2015     Chemistry      Component Value Date/Time   NA 142 09/18/2015 1556   K 3.5 09/18/2015 1556   CL 107 09/18/2015 1556   CO2 28 09/18/2015 1556   BUN 18 09/18/2015 1556   CREATININE 1.01 09/18/2015 1556      Component Value Date/Time   CALCIUM 9.1 09/18/2015 1556   ALKPHOS 116 07/30/2015  0737   AST 20 07/30/2015 0737   ALT 20 07/30/2015 0737   BILITOT 0.8 07/30/2015 0737     Addendum labs.  Lab work is received from primary care physician dated July 21, 2018.  Sodium was 142, potassium 3.7, chloride 105, CO2 28, BUN 17 and creatinine 0.92.  Glucose was 98.  ASSESSMENT/PLAN:  1.  Gait instability, in  part due to PN  -labs for reversible causes of PN negative, except mild B12 deficiency 2.  Parkinson's disease  -continue carbidopa/levodopa 25/100, 1 po tid  -pt wants to play tennis.  I told him I would worry about hip fx's.  Would rather see him engaged in other PD programs.  -invited to [facility]S  3.  B12 deficiency  -Continue supplement.    -was going to do B12 level but will see if had done at [facility] 4.  Word finding trouble/memory loss  -he has an appt with Dr. Si Raider in Feb.   5. Follow up is anticipated after testing with Dr. Si Raider, sooner should new neurologic issues arise.  Much greater than 50% of this visit was spent in counseling and coordinating care.  Total face to face time:  25 min

## 2018-07-22 ENCOUNTER — Ambulatory Visit (INDEPENDENT_AMBULATORY_CARE_PROVIDER_SITE_OTHER): Payer: Medicare Other | Admitting: Neurology

## 2018-07-22 ENCOUNTER — Encounter: Payer: Self-pay | Admitting: Neurology

## 2018-07-22 VITALS — BP 142/60 | HR 68 | Ht 66.0 in | Wt 142.0 lb

## 2018-07-22 DIAGNOSIS — I2510 Atherosclerotic heart disease of native coronary artery without angina pectoris: Secondary | ICD-10-CM | POA: Diagnosis not present

## 2018-07-22 DIAGNOSIS — G20 Parkinson's disease: Secondary | ICD-10-CM | POA: Diagnosis not present

## 2018-07-22 MED ORDER — CARBIDOPA-LEVODOPA 25-100 MG PO TABS: 1.0000 | ORAL_TABLET | Freq: Three times a day (TID) | ORAL | 1 refills | Status: DC

## 2018-07-22 NOTE — Patient Instructions (Signed)
Powering Together for [employer] & Movement Disorders  The [facility] Movement Disorders team know that living well with a movement disorder extends far beyond our clinic walls. We are together with you. Our team is passionate about providing resources to you and your loved ones who are living with Parkinson's disease and movement disorders. Participate in these programs and join our community. These resources are free or low cost!   [facility] is adding:   Innovative educational programs for patients and caregivers.   Support groups for patients and caregivers living with Parkinson's disease.   Parkinson's specific exercise programs.   Custom tailored therapeutic programs that will benefit patient's living with Parkinson's disease.   We are in this together. You can help and contribute to grow these programs and resources in our community. 100% of the funds donated to the [place] stays right here in our community to support patients and their caregivers.  To make a tax deductible contribution:  -ask for a Power Together for Parkinson's envelope in the office today.  - call the Office of Institutional Advancement at [phone].       Community Parkinson's Exercise Programs   Parkinson's Wellness Recovery Exercise Programs:   PWR! Moves PD Exercise Class:  This is a therapist-led exercise class for people with Parkinson's disease in the [place] community. It consists of a one-hour exercise class each week. Classes are offered in eight-week sessions, and the cost per session is $80. Class size is limited to a maximum of 20 participants. Participant criteria includes: Participant must be able to get up and down from the floor with minimal to no assistance, have had 0-1 falls in the past 6 months, and have completed physical or occupational therapy at [facility] within the past  year.  To find out more about session dates, questions, or to register, please contact Mady Haagensen, Physical Therapist, or Nita Sells, Physical Brewing technologist, at [facility] at [phone].  PWR! Circuit Class:  This is a therapist-led exercise class with intervals of circuit activities incorporating PWR! Moves into functional activities. It consists of one 45-minute exercise class per week. Classes are offered in eight-week sessions, and the cost per session is $120. Class size is limited to a maximum of eight participants to allow for hands-on instruction. Participant criteria: class is ideal for people with Parkinson's disease who have completed PWR! Moves Exercise Class or who are currently independently exercising and want to be challenged, must be able to walk independently with 0-1 falls in the past 6 months, able to get up and down from the floor independently, able to sit to stand independently, and able to jog 20 feet.   To find out more about session dates, questions, or to register, please contact Mady Haagensen, Physical Therapist, or Nita Sells, Physical Brewing technologist, at [facility] at [phone].   [employer] Parkinson's Cycle:   Parkinson's Cycle Class at [facility] This is an ongoing class on Monday and Thursday mornings at 10:45 a.m. A healthcare provider referral is required to enroll. This class is FREE to participants, and you do not have to be a member of the [employer] to enroll. Contact Beth at [phone] or [email or web address]. Parkinson's Cycle Class at [facility] Ongoing Class Monday, Wednesday, and Friday mornings at 9:00 a.m. A healthcare provider referral is required to enroll. This class is FREE to participants, and you do not have to be  a member of the [employer] to enroll. Contact Marlee at [phone] or [email or web address]. Parkinson's Cycle Class at  [facility] Ongoing Class every Friday mornings at 12 p.m.  A healthcare provider referral is required to enroll. This class is FREE to participants, and you do not have to be a member of the [employer] to enroll. Contact [phone].  Parkinson's Cycle Class at [facility] Ongoing Class every Monday at 12pm.  A healthcare provider referral is required to enroll. This class is FREE to participants, and you do not have to be a member of the [employer] to enroll. Contact Almyra Free at [phone] or  [email or web address].   [employer]:  [employer]  Classes are offered Mondays at 5:15 p.m. and Tuesdays and Thursdays at 12 p.m. at [employer]. For more information, contact [phone] or visit [email or web address] or [email or web address]. [employer] [place] Classes are offered Monday, Wednesday, and Friday from 9:30 a.m. - 11:00 a.m. For more information, contact [phone] or [phone] or email [email or web address] or visit [email or web address] or [email or web address]. [employer] (classes are offered at 2 locations) . [facility] [employer] in [place] (for more information, contact Harrah at [phone] or email [email or web address] . Cathren Laine at [facility] (class is open to the public -- for more information, contact Clabe Seal at [phone] or email [email or web address]) [place] are held at [facility] in [place], [place]. For more information, call Dr. Bing Plume at [phone] or [email or web address].   Personal Training for Parkinson's:   [employer] Offers certified personal training to customize a program to meet your exercise needs to address Parkinson's disease. For more information, contact [phone] or visit [email or web address].  Community Dance for Parkinson's:   Community dance class for people with Parkinson's  Disease Wednesdays at 9 a.m. [employer] [place], [place] [address] Please contact Eliberto Ivory [phone] for more information  Scholarships Available for Fitness Programs:  The [place] for [employer] is a non-profit 501(C)3 organization run by volunteers, whose mission is to strive to empower those living with Parkinson's Disease (PD), Progressive Supra-Nuclear Palsy (PSP) and Multiple System Atrophy ([place]).  Through financial support, recipients benefit from individual and group programs. 4088500401 [email or web address]

## 2018-08-03 ENCOUNTER — Encounter: Payer: Medicare Other | Admitting: Psychology

## 2018-08-04 DIAGNOSIS — H6123 Impacted cerumen, bilateral: Secondary | ICD-10-CM | POA: Diagnosis not present

## 2018-08-04 DIAGNOSIS — S61219A Laceration without foreign body of unspecified finger without damage to nail, initial encounter: Secondary | ICD-10-CM | POA: Diagnosis not present

## 2018-08-09 DIAGNOSIS — S40812A Abrasion of left upper arm, initial encounter: Secondary | ICD-10-CM | POA: Diagnosis not present

## 2018-08-09 DIAGNOSIS — Z23 Encounter for immunization: Secondary | ICD-10-CM | POA: Diagnosis not present

## 2018-09-21 ENCOUNTER — Telehealth: Payer: Self-pay | Admitting: Neurology

## 2018-09-21 NOTE — Telephone Encounter (Signed)
Dr Bailar is leaving for a new job closer to home we have mailed a letter to the patient to inform them that we canceled all appointments with Dr Bailar. We thank you for the understanding °

## 2018-10-22 ENCOUNTER — Other Ambulatory Visit: Payer: Self-pay | Admitting: Cardiovascular Disease

## 2018-10-25 ENCOUNTER — Telehealth: Payer: Self-pay | Admitting: Neurology

## 2018-10-25 NOTE — Telephone Encounter (Signed)
Left message regarding letter mailed (Dr Si Raider leaving). Explained that we would be happy to refer patient to another neuropsychologist  - patient had appt with Bailar in Feb. 2020.. Asked to call us if wanting to proceed or if have questions.

## 2019-01-13 ENCOUNTER — Encounter: Payer: Medicare Other | Admitting: Psychology

## 2019-01-13 ENCOUNTER — Encounter: Payer: Self-pay | Admitting: Cardiovascular Disease

## 2019-01-13 ENCOUNTER — Encounter

## 2019-01-13 ENCOUNTER — Ambulatory Visit (INDEPENDENT_AMBULATORY_CARE_PROVIDER_SITE_OTHER): Payer: Medicare Other | Admitting: Cardiovascular Disease

## 2019-01-13 VITALS — BP 136/80 | HR 62 | Ht 66.0 in | Wt 137.1 lb

## 2019-01-13 DIAGNOSIS — E782 Mixed hyperlipidemia: Secondary | ICD-10-CM | POA: Diagnosis not present

## 2019-01-13 DIAGNOSIS — I10 Essential (primary) hypertension: Secondary | ICD-10-CM | POA: Diagnosis not present

## 2019-01-13 DIAGNOSIS — I2510 Atherosclerotic heart disease of native coronary artery without angina pectoris: Secondary | ICD-10-CM

## 2019-01-13 NOTE — Patient Instructions (Signed)
Medication Instructions:  Your provider recommends that you continue on your current medications as directed. Please refer to the Current Medication list given to you today.    Labwork: None  Testing/Procedures: None  Follow-Up: Your provider wants you to follow-up in: 1 year with Dr. Cooper. You will receive a reminder letter in the mail two months in advance. If you don't receive a letter, please call our office to schedule the follow-up appointment.    

## 2019-01-13 NOTE — Progress Notes (Signed)
Cardiology Office Note:    Date:  01/13/2019   ID:  CALTON HARSHFIELD, DOB [birth year], MRN [id number]  PCP:  Wenda Low, MD  Cardiologist:  Sherren Mocha, MD  Electrophysiologist:  None   Referring MD: Wenda Low, MD   Chief Complaint  Patient presents with  . Coronary Artery Disease   History of Present Illness:   [name] [name] is a 85 y.o. male with a hx of coronary artery disease, presenting for follow-up evaluation.  The patient presented with unstable angina in 2005 and was treated with overlapping drug-eluting stents in the proximal LAD and a third stent in the distal LAD. He had no significant disease in his left circumflex or right coronary artery.  The patient is here with his wife and his caregiver today.  He has had progressive problems related to Parkinson's disease over the last year.  He is beginning to have a lot of memory problems.  From a cardiac perspective, he does not have any symptoms.  He specifically denies chest pain, chest pressure, shortness of breath, edema, or heart palpitations.  He is still walking every day.  He is compliant with his medications.    Past Medical History:  Diagnosis Date  . Asthma    post infectious  . Bladder cancer [facility])    Dr Estill Dooms, [facility]  . CAD (coronary artery disease)    a. s/p Cypher DES x 2 to prox LAD and Cypher DES x 1 to distal LAD 08/2009;  b. cath with residual D2 50% 10/05 and normal EF;  c. Myoview 6/11: EF 60%, mild fixed INF, inferoseptal, inferoapical thinning, no ischemia, low risk, EF 60%  . Colon polyps   . GERD (gastroesophageal reflux disease)   . HLD (hyperlipidemia)    h/o elevated LFTs on Zocor  . Nephrolithiasis   . Other abnormal glucose 2012   FBS 104  . Skin cancer, basal cell    Dr Sherrye Payor    Past Surgical History:  Procedure Laterality Date  . BLADDER TUMOR EXCISION  2011, 2012, 2013   Dr Estill Dooms, [facility]  . CATARACT EXTRACTION  2003   Bilateral, [facility]  . Colonoscopy  with polypectomy  2005   Dr. Verl Blalock  . Coronary artery stenting  2005   Dr. Olevia Perches, 3 vessel  . LITHOTRIPSY  2002  . UPPER GASTROINTESTINAL ENDOSCOPY  2005   Esophageal reflux, hiatal hernia    Current Medications: Current Meds  Medication Sig  . amLODipine (NORVASC) 10 MG tablet Take 1 tablet (10 mg total) by mouth daily.  Marland Kitchen aspirin 81 MG tablet Take 81 mg by mouth daily.    . carbidopa-levodopa (SINEMET IR) 25-100 MG tablet Take 1 tablet by mouth 3 (three) times daily.  . clopidogrel (PLAVIX) 75 MG tablet TAKE 1 TABLET BY MOUTH DAILY  . fluticasone (FLONASE) 50 MCG/ACT nasal spray Place 2 sprays into both nostrils daily. --- Office visit needed for further refills  . furosemide (LASIX) 20 MG tablet TK 2 TS PO ONCE A DAY  . Glycerin-Hypromellose-PEG 400 0.2-0.2-1 % SOLN 1 drop into each eye daily as needed for dry eye  . nitroGLYCERIN (NITROSTAT) 0.4 MG SL tablet Place 1 tablet (0.4 mg total) under the tongue every 5 (five) minutes as needed for chest pain.  . pantoprazole (PROTONIX) 20 MG tablet Take 1 tablet by mouth daily.  . potassium chloride (K-DUR) 10 MEQ tablet TK 1 T PO ONCE A DAY WF  . rosuvastatin (CRESTOR) 5 MG  tablet Take 1 tablet by mouth daily on Monday, Wednesday and Friday. Please make appt with Dr. Burt Knack for February for future refills. 1st attempt     Allergies:   Hydromorphone hcl; Phenytoin; Zocor [simvastatin - high dose]; Crestor [rosuvastatin calcium]; Hydromorphone hcl; and Statins   Social History   Socioeconomic History  . Marital status: Married    Spouse name: Not on file  . Number of children: Not on file  . Years of education: Not on file  . Highest education level: Not on file  Occupational History  . Not on file  Social Needs  . Financial resource strain: Not on file  . Food insecurity:    Worry: Not on file    Inability: Not on file  . Transportation needs:    Medical: Not on file    Non-medical: Not on file  Tobacco Use  .  Smoking status: Former Smoker    Types: Cigarettes    Last attempt to quit: 11/17/1960    Years since quitting: 58.1  . Smokeless tobacco: Never Used  . Tobacco comment: smoked 1948-1962, up to 1/2 ppd  Substance and Sexual Activity  . Alcohol use: Yes    Alcohol/week: 10.0 standard drinks    Types: 10 Glasses of wine per week    Comment: wine @ dinner  . Drug use: No  . Sexual activity: Not on file  Lifestyle  . Physical activity:    Days per week: Not on file    Minutes per session: Not on file  . Stress: Not on file  Relationships  . Social connections:    Talks on phone: Not on file    Gets together: Not on file    Attends religious service: Not on file    Active member of club or organization: Not on file    Attends meetings of clubs or organizations: Not on file    Relationship status: Not on file  Other Topics Concern  . Not on file  Social History Narrative  . Not on file     Family History: The patient's family history includes Alcohol abuse in his brother; Asthma in his brother; Coronary artery disease in his father; Diabetes in his brother; Heart attack in his mother; Heart disease in his paternal grandmother; Hypertension in his mother; Prostate cancer in his maternal grandfather; Stroke in his mother.  ROS:   Please see the history of present illness.    Positive for memory loss, hearing loss, depression.  All other systems reviewed and are negative.  EKGs/Labs/Other Studies Reviewed:    The following studies were reviewed today: Myoview Scan 04/08/2016: Study Highlights    Nuclear stress EF: 62%. The left ventricular ejection fraction is normal (55-65%).  There was no ST segment deviation noted during stress.  The study is normal.  This is a low risk study.   EKG:  EKG is ordered today.  The ekg ordered today demonstrates normal sinus rhythm 62 bpm, within normal limits.  Recent Labs: No results found for requested labs within last 8760 hours.    Recent Lipid Panel    Component Value Date/Time   CHOL 155 07/30/2015 0737   TRIG 41.0 07/30/2015 0737   TRIG 39 10/21/2006 1059   HDL 68.70 07/30/2015 0737   CHOLHDL 2 07/30/2015 0737   VLDL 8.2 07/30/2015 0737   LDLCALC 78 07/30/2015 0737    Physical Exam:    VS:  BP 136/80   Pulse 62   Ht 5\' 6"  (  1.676 m)   Wt 137 lb 1.9 oz (62.2 kg)   SpO2 97%   BMI 22.13 kg/m     Wt Readings from Last 3 Encounters:  01/13/19 137 lb 1.9 oz (62.2 kg)  07/22/18 142 lb (64.4 kg)  02/16/18 148 lb (67.1 kg)     GEN: Elderly male in no acute distress HEENT: Normal NECK: No JVD; No carotid bruits LYMPHATICS: No lymphadenopathy CARDIAC: RRR, no murmurs, rubs, gallops RESPIRATORY:  Clear to auscultation without rales, wheezing or rhonchi  ABDOMEN: Soft, non-tender, non-distended MUSCULOSKELETAL:  No edema; No deformity  SKIN: Warm and dry NEUROLOGIC:  Alert and oriented x 3 PSYCHIATRIC:  Normal affect   ASSESSMENT:    1. Coronary artery disease involving native coronary artery of native heart without angina pectoris   2. Mixed hyperlipidemia   3. Essential hypertension    PLAN:    In order of problems listed above:  1. The patient has no anginal symptoms.  He is maintained on long-term dual antiplatelet therapy with aspirin and clopidogrel.  He is treated with low-dose rosuvastatin.  He has been intolerant to higher doses of Crestor and cannot take simvastatin because of elevated liver function test. 2. Treated with a statin drug with dose limitation related to side effects 3. Blood pressure is well controlled on amlodipine  Medication Adjustments/Labs and Tests Ordered: Current medicines are reviewed at length with the patient today.  Concerns regarding medicines are outlined above.  Orders Placed This Encounter  Procedures  . EKG 12-Lead   No orders of the defined types were placed in this encounter.   Patient Instructions  Medication Instructions:  Your provider  recommends that you continue on your current medications as directed. Please refer to the Current Medication list given to you today.    Labwork: None  Testing/Procedures: None  Follow-Up: Your provider wants you to follow-up in: 1 year with Dr. Burt Knack. You will receive a reminder letter in the mail two months in advance. If you don't receive a letter, please call our office to schedule the follow-up appointment.      Signed, Sherren Mocha, MD  01/13/2019 4:35 PM    [place]

## 2019-01-25 NOTE — Progress Notes (Signed)
[name] [name] was seen today in the movement disorders clinic for neurologic consultation at the request of Wenda Low, MD.  The consultation is for the evaluation of gait instability for approximately 2-3 years and tremor in the right hand.  Pt states that he didn't even notice the tremor but Dr. [name] pointed it out to him.  States that balance is off especially when on uneven ground.  He feels that he is "wobbling."  He has not had falls but has had near falls.  Some paresthesias in the feet at night when wakes up but otherwise no.  No DM.  Did stop his crestor because thought that it was causing myalgia.  They just restarted but he is only taking 2 tablets a week.  He had increased LFT's on zocor The records that were made available to me were reviewed.  04/01/16 update:  The patient is following up today.  I last saw him in November.  At that point in time, I did think that some of his gait changes were from peripheral neuropathy.  I did lab work to rule out reversible causes and his B12 was low at 259.  I asked him to start taking an oral supplement and he states that he does that but forgets about 1/2 the time.  He also has a history of a parkinsonian tremor, without meeting the [place] brain bank criteria for Parkinson's disease.  That is primary reason I am following him up in 6 month intervals.   He states that he never has noted it on the L really and it doesn't bother him.  He has noted some tremor in the right leg.  He states that he did much better physically after going through PT.   I did review records since our last visit.  He recently saw cardiology on 03/27/2016.  He had atypical angina.  He is scheduled for an exercise stress Myoview.  04/14/17 update:  Pt seen today in follow up.  I haven't seen him in a year.  The records that were made available to me were reviewed.    He went through PT in the fall of last year.  States that he slipped on a wet concrete block.  He did injure his  ankle opened up the skin.  Has seen several physicians/practioners about it.  No fx to it.  He states that it has healed it.    Does c/o some word finding and memory retrieval.   States that he used to be able to do complex math and cannot do anymore.  If he sleeps better he does better.    10/15/17 update:  Pt seen in f/u for tremor.  Reports that tremor slightly worse.  It is the R arm.  Balance has not been as good but no falls.  He walks better if he is going fast.  He describes some festinating gait occasionally.  Describes inner tremor.  02/16/18 update:  Pt seen in f/u.  This patient is accompanied in the office by his spouse who supplements the history. Started on carbidopa/levodopa 25/100 tid last visit (misses lunch dosage 20% of the time but daughter put alarm on cell phone and it helps).  He gets nausea with the morning dosage.  Pt denies falls.  Pt denies lightheadedness, near syncope.  No hallucinations.  Mood has been good.  Walking for exercise or rides stationary bike for exericise.   The records that were made available to me were reviewed.  Pt has been keeping in contact with our Education officer, [employer].  ST at [facility] did call about her concern for memory change.  Pt previously declined neurocognitive testing.  Wife reports speech is much better.  Wife thinks that patients memory is pretty good but "he forgets some things."  Pt states still taking b12, but wife not sure.  He states that he is taking 5000 but then reports he hasn't taken it in few days!  07/22/18 update: Patient is seen today in follow-up for Parkinson's disease.  He is on carbidopa/levodopa 25/100, 1 tablet 3 times per day.  No falls.  No lightheadedness or near syncope.  He saw [facility] for sciatica on the left in the beginning of June and he was given a steroid Dosepak.  His pain went away.  He is under a lot of stress.  His wife had 2 strokes.  He is wanting to get back to playing tennis and asks me about that.  "I  have never fallen."  01/27/19 update: Patient is seen today in follow-up for Parkinson's disease, accompanied by his daughter who supplements the history.  Patient is on carbidopa/levodopa 25/100, 1 tablet 3 times per day.  Daughter states that they have an alarm on the phone but patient states that lately he may miss it "a couple of times per day."    He has had no lightheadedness or near syncope.  Dreams have been "wild."  He has yelled out and kicked some.  No one sleeps with the patient.  He did fall one time out of the bed.  He was seen on August 09, 2018 at the [facility] for a [facility].  He was given a Tdap.  He had only a minor abrasion to the left forearm.  Other records are reviewed.  Seen by cardiology on February 27.  He is on aspirin and Plavix.  He is on amlodipine.  He was told to follow-up in 1 year.  He did recently have the n/v on Sunday.  Daughter states that he has not quite cognitively recovered.  Has been more paranoid.  More confused.  Not taking medication right.  Learned he was driving on expired license.  No longer driving.  Wife lives at home with him, but is not strong physically, but is mentally.  PREVIOUS MEDICATIONS: none to date  ALLERGIES:   Allergies  Allergen Reactions  . Hydromorphone Hcl      Dilaudid Rxed for renal calculi caused angioedema   . Phenytoin Swelling  . Zocor [Simvastatin - High Dose]     Elevated hepatic enzymes  . Crestor [Rosuvastatin Calcium] Other (See Comments)    Leg pain. Not as bad since on a lower dose  . Hydromorphone Hcl      Dilaudid Rxed for renal calculi caused angioedema  . Statins     Leg pain    CURRENT MEDICATIONS:  Outpatient Encounter Medications as of 01/27/2019  Medication Sig  . amLODipine (NORVASC) 10 MG tablet Take 1 tablet (10 mg total) by mouth daily.  Marland Kitchen aspirin 81 MG tablet Take 81 mg by mouth daily.    . carbidopa-levodopa (SINEMET IR) 25-100 MG tablet Take 1 tablet by mouth 3 (three) times daily.  .  clopidogrel (PLAVIX) 75 MG tablet TAKE 1 TABLET BY MOUTH DAILY  . fluticasone (FLONASE) 50 MCG/ACT nasal spray Place 2 sprays into both nostrils daily. --- Office visit needed for further refills  . furosemide (LASIX) 20 MG tablet TK 2 TS PO  ONCE A DAY  . Glycerin-Hypromellose-PEG 400 0.2-0.2-1 % SOLN 1 drop into each eye daily as needed for dry eye  . nitroGLYCERIN (NITROSTAT) 0.4 MG SL tablet Place 1 tablet (0.4 mg total) under the tongue every 5 (five) minutes as needed for chest pain.  . pantoprazole (PROTONIX) 20 MG tablet Take 1 tablet by mouth daily.  . potassium chloride (K-DUR) 10 MEQ tablet TK 1 T PO ONCE A DAY WF  . rosuvastatin (CRESTOR) 5 MG tablet Take 1 tablet by mouth daily on Monday, Wednesday and Friday. Please make appt with Dr. Burt Knack for February for future refills. 1st attempt   No facility-administered encounter medications on file as of 01/27/2019.     PAST MEDICAL HISTORY:   Past Medical History:  Diagnosis Date  . Asthma    post infectious  . Bladder cancer [facility])    Dr Estill Dooms, [facility]  . CAD (coronary artery disease)    a. s/p Cypher DES x 2 to prox LAD and Cypher DES x 1 to distal LAD 08/2009;  b. cath with residual D2 50% 10/05 and normal EF;  c. Myoview 6/11: EF 60%, mild fixed INF, inferoseptal, inferoapical thinning, no ischemia, low risk, EF 60%  . Colon polyps   . GERD (gastroesophageal reflux disease)   . HLD (hyperlipidemia)    h/o elevated LFTs on Zocor  . Nephrolithiasis   . Other abnormal glucose 2012   FBS 104  . Skin cancer, basal cell    Dr Sherrye Payor    PAST SURGICAL HISTORY:   Past Surgical History:  Procedure Laterality Date  . BLADDER TUMOR EXCISION  2011, 2012, 2013   Dr Estill Dooms, [facility]  . CATARACT EXTRACTION  2003   Bilateral, [facility]  . Colonoscopy with polypectomy  2005   Dr. Verl Blalock  . Coronary artery stenting  2005   Dr. Olevia Perches, 3 vessel  . LITHOTRIPSY  2002  . UPPER GASTROINTESTINAL ENDOSCOPY  2005   Esophageal  reflux, hiatal hernia    SOCIAL HISTORY:   Social History   Socioeconomic History  . Marital status: Married    Spouse name: Not on file  . Number of children: Not on file  . Years of education: Not on file  . Highest education level: Not on file  Occupational History  . Not on file  Social Needs  . Financial resource strain: Not on file  . Food insecurity:    Worry: Not on file    Inability: Not on file  . Transportation needs:    Medical: Not on file    Non-medical: Not on file  Tobacco Use  . Smoking status: Former Smoker    Types: Cigarettes    Last attempt to quit: 11/17/1960    Years since quitting: 58.2  . Smokeless tobacco: Never Used  . Tobacco comment: smoked 1948-1962, up to 1/2 ppd  Substance and Sexual Activity  . Alcohol use: Yes    Alcohol/week: 10.0 standard drinks    Types: 10 Glasses of wine per week    Comment: wine @ dinner  . Drug use: No  . Sexual activity: Not on file  Lifestyle  . Physical activity:    Days per week: Not on file    Minutes per session: Not on file  . Stress: Not on file  Relationships  . Social connections:    Talks on phone: Not on file    Gets together: Not on file    Attends religious service: Not on  file    Active member of club or organization: Not on file    Attends meetings of clubs or organizations: Not on file    Relationship status: Not on file  . Intimate partner violence:    Fear of current or ex partner: Not on file    Emotionally abused: Not on file    Physically abused: Not on file    Forced sexual activity: Not on file  Other Topics Concern  . Not on file  Social History Narrative  . Not on file    FAMILY HISTORY:   Family Status  Relation Name Status  . Mother  Deceased at age 61       stroke  . Father  Deceased       heart disease  . PGM  Deceased  . Brother  Deceased       ETOH, depression  . Brother  Alive       healthy  . Brother  Alive       healthy  . Sister  Alive       healthy    . Child  Alive       2, alive and well  . Brother  (Not Specified)  . Brother Pilar Plate (Not Specified)  . MGF  Deceased  . MGM  Deceased  . PGF  Deceased    ROS:  Review of Systems  Unable to perform ROS: Dementia   PHYSICAL EXAMINATION:    VITALS:   Vitals:   01/27/19 1302  BP: 128/60  Pulse: 86  Temp: 98.4 F (36.9 C)  SpO2: 96%  Weight: 134 lb (60.8 kg)  Height: 5\' 6"  (1.676 m)    GEN:  The patient appears stated age and is in NAD. HEENT:  Normocephalic, atraumatic.  The mucous membranes are moist. The superficial temporal arteries are without ropiness or tenderness. CV:  RRR Lungs:  CTAB Neck/HEME:  There are no carotid bruits bilaterally.  Neurological examination:  Orientation:  Montreal Cognitive Assessment  02/16/2018  Visuospatial/ Executive (0/5) 0  Naming (0/3) 3  Attention: Read list of digits (0/2) 2  Attention: Read list of letters (0/1) 1  Attention: Serial 7 subtraction starting at 100 (0/3) 1  Language: Repeat phrase (0/2) 1  Language : Fluency (0/1) 0  Abstraction (0/2) 1  Delayed Recall (0/5) 0  Orientation (0/6) 6  Total 15  Adjusted Score (based on education) 15   Cranial nerves: There is good facial symmetry.  The speech is fluent and clear. Soft palate rises symmetrically and there is no tongue deviation. Hearing is intact to conversational tone. Sensation: Sensation is intact to light intact.   Motor: Strength is 5/5 in the bilateral upper and lower extremities.   Shoulder shrug is equal and symmetric.  There is no pronator drift.   Movement examination: Tone: There is mild increased tone in the bilateral upper extremities, right more than left. Abnormal movements: There is mild tremor in the right thumb. Coordination:  There is apraxia when asked to perform rapid alternating movements, so it was difficult to tell about decremation. Gait and Station: The patient has no difficulty arising out of a deep-seated chair without the use of the  hands. The patient's stride length was good with purposeful arm swing (daughter states that he is showing off)  Lab Results  Component Value Date   VITAMINB12 399 04/01/2016    Lab Results  Component Value Date   TSH 3.75 09/18/2015   Lab Results  Component Value Date   HGBA1C 4.7 09/18/2015     Chemistry      Component Value Date/Time   NA 142 09/18/2015 1556   K 3.5 09/18/2015 1556   CL 107 09/18/2015 1556   CO2 28 09/18/2015 1556   BUN 18 09/18/2015 1556   CREATININE 1.01 09/18/2015 1556      Component Value Date/Time   CALCIUM 9.1 09/18/2015 1556   ALKPHOS 116 07/30/2015 0737   AST 20 07/30/2015 0737   ALT 20 07/30/2015 0737   BILITOT 0.8 07/30/2015 0737     Addendum labs.  Lab work is received from primary care physician dated July 21, 2018.  Sodium was 142, potassium 3.7, chloride 105, CO2 28, BUN 17 and creatinine 0.92.  Glucose was 98.  ASSESSMENT/PLAN:  1.  Gait instability, in part due to PN  -labs for reversible causes of PN negative, except mild B12 deficiency 2.  Parkinson's disease  -continue carbidopa/levodopa 25/100, 1 po tid. may need a bigger dose, but I was leery about that right now given increasing confusion  -We will send PT to the house 3.  B12 deficiency  -Continue supplement.   4.  Word finding trouble/memory loss  -We will put on the waiting list for neurocognitive testing with Dr. Melvyn Novas  -We will do home ST for cognition  -Suspect patient has developed dementia, but he had some type of illness over the weekend with associated nausea and vomiting and cognitively has gotten much worse since that time.  Explained to the patient and daughter that this is common in patients who have Parkinson's with baseline memory impairment and it often takes time for that to resolve.  I will do lab work, including urinalysis, CBC, chemistry, ammonia.  If that is normal, and if symptoms do not resolve, we may need to add some medication, as the patient is  experiencing paranoia and what sounds like possible delusions. 5. REM behavior disorder  -This is commonly associated with PD and the patient is experiencing this.  We discussed that this can be very serious and even harmful.  We talked about medications as well as physical barriers to put in the bed (particularly soft bed rails, pillow barriers).  We talked about moving the night stand so that it is not so close to the side of the bed.  We opted not to give medication, as I worried that something like clonazepam will confuse him more.  His daughter is going to get the side rails from [employer]. 6.  I will plan on seeing him back in the next 5 to 6 weeks, sooner should new neurologic issues arise.  In the meantime, I asked him to follow-up with primary care physician to make sure that nothing else is going on.  I spent greater than 50% of the 40-minute visit in counseling, especially since his daughter is never been here before.  We talked about making sure that he gets his living will in order (apparently had a living will but lost to and the lawyer that did it is retired and the one that took over the practice does not have it).

## 2019-01-27 ENCOUNTER — Ambulatory Visit (INDEPENDENT_AMBULATORY_CARE_PROVIDER_SITE_OTHER): Payer: Medicare Other | Admitting: Neurology

## 2019-01-27 ENCOUNTER — Other Ambulatory Visit (INDEPENDENT_AMBULATORY_CARE_PROVIDER_SITE_OTHER): Payer: Medicare Other

## 2019-01-27 ENCOUNTER — Other Ambulatory Visit: Payer: Self-pay

## 2019-01-27 ENCOUNTER — Encounter: Payer: Self-pay | Admitting: Neurology

## 2019-01-27 VITALS — BP 128/60 | HR 86 | Temp 98.4°F | Ht 66.0 in | Wt 134.0 lb

## 2019-01-27 DIAGNOSIS — I2510 Atherosclerotic heart disease of native coronary artery without angina pectoris: Secondary | ICD-10-CM

## 2019-01-27 DIAGNOSIS — R6889 Other general symptoms and signs: Secondary | ICD-10-CM | POA: Diagnosis not present

## 2019-01-27 DIAGNOSIS — G9340 Encephalopathy, unspecified: Secondary | ICD-10-CM | POA: Diagnosis not present

## 2019-01-27 DIAGNOSIS — R251 Tremor, unspecified: Secondary | ICD-10-CM | POA: Diagnosis not present

## 2019-01-27 DIAGNOSIS — Z5181 Encounter for therapeutic drug level monitoring: Secondary | ICD-10-CM | POA: Diagnosis not present

## 2019-01-27 DIAGNOSIS — G20 Parkinson's disease: Secondary | ICD-10-CM | POA: Diagnosis not present

## 2019-01-27 DIAGNOSIS — F062 Psychotic disorder with delusions due to known physiological condition: Secondary | ICD-10-CM | POA: Diagnosis not present

## 2019-01-27 DIAGNOSIS — F22 Delusional disorders: Secondary | ICD-10-CM | POA: Diagnosis not present

## 2019-01-27 NOTE — Patient Instructions (Signed)
1. We will send a referral to home health for physical therapy and speech therapy for cognition. They will contact you directly for a good time to set up care.   2. You can get bed rails from [employer].   3. Your provider has requested that you have labwork completed today. Please go to [facility] ([id number]) on the second floor of this building before leaving the office today. You do not need to check in. If you are not called within 15 minutes please check with the front desk.

## 2019-01-28 ENCOUNTER — Telehealth: Payer: Self-pay

## 2019-01-28 NOTE — Telephone Encounter (Signed)
-----   Message from [place], DO sent at 01/28/2019  7:34 AM EDT ----- Luvenia Starch, call pt/daughter.  Let daughter know (not sure that patient will have capacity to understand) that looks like could have mild UTI.  cx isn't back (make sure that its ordered b/c doesn't say pending.  Have pt f/u with PCP today if able.  Fax results to pcp.

## 2019-01-28 NOTE — Telephone Encounter (Signed)
Spoke with pt's daughter, Mickel Baas, relaying results and recommendation below.

## 2019-01-28 NOTE — Telephone Encounter (Signed)
Results faxed to Dr. Lysle Rubens at [phone]. confirmation received

## 2019-01-29 DIAGNOSIS — J45909 Unspecified asthma, uncomplicated: Secondary | ICD-10-CM | POA: Diagnosis not present

## 2019-01-29 DIAGNOSIS — Z7902 Long term (current) use of antithrombotics/antiplatelets: Secondary | ICD-10-CM | POA: Diagnosis not present

## 2019-01-29 DIAGNOSIS — I2510 Atherosclerotic heart disease of native coronary artery without angina pectoris: Secondary | ICD-10-CM | POA: Diagnosis not present

## 2019-01-29 DIAGNOSIS — Z9181 History of falling: Secondary | ICD-10-CM | POA: Diagnosis not present

## 2019-01-29 DIAGNOSIS — G20 Parkinson's disease: Secondary | ICD-10-CM | POA: Diagnosis not present

## 2019-01-29 DIAGNOSIS — R41841 Cognitive communication deficit: Secondary | ICD-10-CM | POA: Diagnosis not present

## 2019-01-29 DIAGNOSIS — R4182 Altered mental status, unspecified: Secondary | ICD-10-CM | POA: Diagnosis not present

## 2019-01-29 DIAGNOSIS — I10 Essential (primary) hypertension: Secondary | ICD-10-CM | POA: Diagnosis not present

## 2019-01-29 DIAGNOSIS — G629 Polyneuropathy, unspecified: Secondary | ICD-10-CM | POA: Diagnosis not present

## 2019-01-29 DIAGNOSIS — F22 Delusional disorders: Secondary | ICD-10-CM | POA: Diagnosis not present

## 2019-01-29 LAB — COMPREHENSIVE METABOLIC PANEL
AG RATIO: 2.1 (calc) (ref 1.0–2.5)
ALT: 8 U/L — AB (ref 9–46)
AST: 20 U/L (ref 10–35)
Albumin: 4 g/dL (ref 3.6–5.1)
Alkaline phosphatase (APISO): 112 U/L (ref 35–144)
BUN: 23 mg/dL (ref 7–25)
CALCIUM: 8.8 mg/dL (ref 8.6–10.3)
CHLORIDE: 107 mmol/L (ref 98–110)
CO2: 26 mmol/L (ref 20–32)
CREATININE: 1.01 mg/dL (ref 0.70–1.11)
GLUCOSE: 127 mg/dL — AB (ref 65–99)
Globulin: 1.9 g/dL (calc) (ref 1.9–3.7)
Potassium: 3.7 mmol/L (ref 3.5–5.3)
SODIUM: 142 mmol/L (ref 135–146)
Total Bilirubin: 0.6 mg/dL (ref 0.2–1.2)
Total Protein: 5.9 g/dL — ABNORMAL LOW (ref 6.1–8.1)

## 2019-01-29 LAB — CBC WITH DIFFERENTIAL/PLATELET
Absolute Monocytes: 515 cells/uL (ref 200–950)
BASOS ABS: 10 {cells}/uL (ref 0–200)
Basophils Relative: 0.2 %
EOS PCT: 0.6 %
Eosinophils Absolute: 29 cells/uL (ref 15–500)
HCT: 37.9 % — ABNORMAL LOW (ref 38.5–50.0)
Hemoglobin: 13.4 g/dL (ref 13.2–17.1)
LYMPHS ABS: 1245 {cells}/uL (ref 850–3900)
MCH: 33.3 pg — AB (ref 27.0–33.0)
MCHC: 35.4 g/dL (ref 32.0–36.0)
MCV: 94 fL (ref 80.0–100.0)
MONOS PCT: 10.5 %
MPV: 11.5 fL (ref 7.5–12.5)
NEUTROS ABS: 3102 {cells}/uL (ref 1500–7800)
NEUTROS PCT: 63.3 %
PLATELETS: 194 10*3/uL (ref 140–400)
RBC: 4.03 10*6/uL — AB (ref 4.20–5.80)
RDW: 11.9 % (ref 11.0–15.0)
Total Lymphocyte: 25.4 %
WBC: 4.9 10*3/uL (ref 3.8–10.8)

## 2019-01-29 LAB — URINALYSIS
BILIRUBIN URINE: NEGATIVE
Glucose, UA: NEGATIVE
Hgb urine dipstick: NEGATIVE
Nitrite: NEGATIVE
Specific Gravity, Urine: 1.033 (ref 1.001–1.03)
pH: 5.5 (ref 5.0–8.0)

## 2019-01-29 LAB — URINE CULTURE
MICRO NUMBER: 311540
SPECIMEN QUALITY: ADEQUATE

## 2019-01-29 LAB — AMMONIA: Ammonia: 55 umol/L (ref <=72)

## 2019-01-29 LAB — TSH: TSH: 2.74 m[IU]/L (ref 0.40–4.50)

## 2019-01-31 ENCOUNTER — Telehealth: Payer: Self-pay | Admitting: Neurology

## 2019-01-31 NOTE — Telephone Encounter (Signed)
Lab results sent to Dr. Lysle Rubens.

## 2019-01-31 NOTE — Telephone Encounter (Signed)
-----   Message from [place], DO sent at 01/31/2019  7:52 AM EDT ----- I know that last week we asked pt to f/u with PCP based on UA.  Cx is back.  Fax to PCP

## 2019-02-01 DIAGNOSIS — R41841 Cognitive communication deficit: Secondary | ICD-10-CM | POA: Diagnosis not present

## 2019-02-01 DIAGNOSIS — G20 Parkinson's disease: Secondary | ICD-10-CM | POA: Diagnosis not present

## 2019-02-01 DIAGNOSIS — R4182 Altered mental status, unspecified: Secondary | ICD-10-CM | POA: Diagnosis not present

## 2019-02-01 DIAGNOSIS — F22 Delusional disorders: Secondary | ICD-10-CM | POA: Diagnosis not present

## 2019-02-01 DIAGNOSIS — I2510 Atherosclerotic heart disease of native coronary artery without angina pectoris: Secondary | ICD-10-CM | POA: Diagnosis not present

## 2019-02-01 DIAGNOSIS — G629 Polyneuropathy, unspecified: Secondary | ICD-10-CM | POA: Diagnosis not present

## 2019-02-02 DIAGNOSIS — R4182 Altered mental status, unspecified: Secondary | ICD-10-CM | POA: Diagnosis not present

## 2019-02-02 DIAGNOSIS — G20 Parkinson's disease: Secondary | ICD-10-CM | POA: Diagnosis not present

## 2019-02-02 DIAGNOSIS — R41841 Cognitive communication deficit: Secondary | ICD-10-CM | POA: Diagnosis not present

## 2019-02-02 DIAGNOSIS — I2510 Atherosclerotic heart disease of native coronary artery without angina pectoris: Secondary | ICD-10-CM | POA: Diagnosis not present

## 2019-02-02 DIAGNOSIS — F22 Delusional disorders: Secondary | ICD-10-CM | POA: Diagnosis not present

## 2019-02-02 DIAGNOSIS — G629 Polyneuropathy, unspecified: Secondary | ICD-10-CM | POA: Diagnosis not present

## 2019-02-04 DIAGNOSIS — G629 Polyneuropathy, unspecified: Secondary | ICD-10-CM | POA: Diagnosis not present

## 2019-02-04 DIAGNOSIS — G20 Parkinson's disease: Secondary | ICD-10-CM | POA: Diagnosis not present

## 2019-02-04 DIAGNOSIS — R41841 Cognitive communication deficit: Secondary | ICD-10-CM | POA: Diagnosis not present

## 2019-02-04 DIAGNOSIS — F22 Delusional disorders: Secondary | ICD-10-CM | POA: Diagnosis not present

## 2019-02-04 DIAGNOSIS — R4182 Altered mental status, unspecified: Secondary | ICD-10-CM | POA: Diagnosis not present

## 2019-02-04 DIAGNOSIS — I2510 Atherosclerotic heart disease of native coronary artery without angina pectoris: Secondary | ICD-10-CM | POA: Diagnosis not present

## 2019-02-07 DIAGNOSIS — G20 Parkinson's disease: Secondary | ICD-10-CM | POA: Diagnosis not present

## 2019-02-07 DIAGNOSIS — I2510 Atherosclerotic heart disease of native coronary artery without angina pectoris: Secondary | ICD-10-CM | POA: Diagnosis not present

## 2019-02-07 DIAGNOSIS — F22 Delusional disorders: Secondary | ICD-10-CM | POA: Diagnosis not present

## 2019-02-07 DIAGNOSIS — R4182 Altered mental status, unspecified: Secondary | ICD-10-CM | POA: Diagnosis not present

## 2019-02-07 DIAGNOSIS — G629 Polyneuropathy, unspecified: Secondary | ICD-10-CM | POA: Diagnosis not present

## 2019-02-07 DIAGNOSIS — R41841 Cognitive communication deficit: Secondary | ICD-10-CM | POA: Diagnosis not present

## 2019-02-08 DIAGNOSIS — G629 Polyneuropathy, unspecified: Secondary | ICD-10-CM | POA: Diagnosis not present

## 2019-02-08 DIAGNOSIS — I2510 Atherosclerotic heart disease of native coronary artery without angina pectoris: Secondary | ICD-10-CM | POA: Diagnosis not present

## 2019-02-08 DIAGNOSIS — R41841 Cognitive communication deficit: Secondary | ICD-10-CM | POA: Diagnosis not present

## 2019-02-08 DIAGNOSIS — R4182 Altered mental status, unspecified: Secondary | ICD-10-CM | POA: Diagnosis not present

## 2019-02-08 DIAGNOSIS — F22 Delusional disorders: Secondary | ICD-10-CM | POA: Diagnosis not present

## 2019-02-08 DIAGNOSIS — G20 Parkinson's disease: Secondary | ICD-10-CM | POA: Diagnosis not present

## 2019-02-09 DIAGNOSIS — G20 Parkinson's disease: Secondary | ICD-10-CM | POA: Diagnosis not present

## 2019-02-09 DIAGNOSIS — F22 Delusional disorders: Secondary | ICD-10-CM | POA: Diagnosis not present

## 2019-02-09 DIAGNOSIS — G629 Polyneuropathy, unspecified: Secondary | ICD-10-CM | POA: Diagnosis not present

## 2019-02-09 DIAGNOSIS — R4182 Altered mental status, unspecified: Secondary | ICD-10-CM | POA: Diagnosis not present

## 2019-02-09 DIAGNOSIS — I2510 Atherosclerotic heart disease of native coronary artery without angina pectoris: Secondary | ICD-10-CM | POA: Diagnosis not present

## 2019-02-09 DIAGNOSIS — R41841 Cognitive communication deficit: Secondary | ICD-10-CM | POA: Diagnosis not present

## 2019-02-10 ENCOUNTER — Telehealth: Payer: Self-pay | Admitting: Neurology

## 2019-02-10 NOTE — Telephone Encounter (Signed)
Spoke with Ria Comment, RN at [facility], and gave verbal orders to decrease visits to once weekly from twice weekly. Patient is getting overwhelmed with number of visits as he is also having therapies.

## 2019-02-11 ENCOUNTER — Telehealth: Payer: Self-pay | Admitting: Neurology

## 2019-02-11 DIAGNOSIS — R41841 Cognitive communication deficit: Secondary | ICD-10-CM | POA: Diagnosis not present

## 2019-02-11 DIAGNOSIS — R4182 Altered mental status, unspecified: Secondary | ICD-10-CM | POA: Diagnosis not present

## 2019-02-11 DIAGNOSIS — G629 Polyneuropathy, unspecified: Secondary | ICD-10-CM | POA: Diagnosis not present

## 2019-02-11 DIAGNOSIS — F22 Delusional disorders: Secondary | ICD-10-CM | POA: Diagnosis not present

## 2019-02-11 DIAGNOSIS — G20 Parkinson's disease: Secondary | ICD-10-CM | POA: Diagnosis not present

## 2019-02-11 DIAGNOSIS — I2510 Atherosclerotic heart disease of native coronary artery without angina pectoris: Secondary | ICD-10-CM | POA: Diagnosis not present

## 2019-02-11 NOTE — Telephone Encounter (Signed)
Left message on machine for patient's daughter to call back.   

## 2019-02-11 NOTE — Telephone Encounter (Signed)
This is not new per daughter last visit and is why we had done labs.  We referred him to PCP for possible UTI (not sure what happened with that as his PCP is not on our med record system).  Pt has appt here but not until May.  Find out from daughter (if on [place]) if something new or same as last visit.  Pt likely with dementia.  Make sure someone with him to caregive.

## 2019-02-11 NOTE — Telephone Encounter (Signed)
[name] [name] with [facility] called [phone]) to let us know patient confided in her that at night he has had a lot more confusion. Last night he felt like someone came in and rearranged his house and it made him more confused. He felt himself get more aggressive and it scared him. Wanted to know if there is something we could offer him to help with his night time confusion?  Dr. Carles Collet please advise.

## 2019-02-14 DIAGNOSIS — G20 Parkinson's disease: Secondary | ICD-10-CM | POA: Diagnosis not present

## 2019-02-14 DIAGNOSIS — G629 Polyneuropathy, unspecified: Secondary | ICD-10-CM | POA: Diagnosis not present

## 2019-02-14 DIAGNOSIS — R41841 Cognitive communication deficit: Secondary | ICD-10-CM | POA: Diagnosis not present

## 2019-02-14 DIAGNOSIS — I2510 Atherosclerotic heart disease of native coronary artery without angina pectoris: Secondary | ICD-10-CM | POA: Diagnosis not present

## 2019-02-14 DIAGNOSIS — R4182 Altered mental status, unspecified: Secondary | ICD-10-CM | POA: Diagnosis not present

## 2019-02-14 DIAGNOSIS — F22 Delusional disorders: Secondary | ICD-10-CM | POA: Diagnosis not present

## 2019-02-15 DIAGNOSIS — R41841 Cognitive communication deficit: Secondary | ICD-10-CM | POA: Diagnosis not present

## 2019-02-15 DIAGNOSIS — I2510 Atherosclerotic heart disease of native coronary artery without angina pectoris: Secondary | ICD-10-CM | POA: Diagnosis not present

## 2019-02-15 DIAGNOSIS — R4182 Altered mental status, unspecified: Secondary | ICD-10-CM | POA: Diagnosis not present

## 2019-02-15 DIAGNOSIS — F22 Delusional disorders: Secondary | ICD-10-CM | POA: Diagnosis not present

## 2019-02-15 DIAGNOSIS — G629 Polyneuropathy, unspecified: Secondary | ICD-10-CM | POA: Diagnosis not present

## 2019-02-15 DIAGNOSIS — G20 Parkinson's disease: Secondary | ICD-10-CM | POA: Diagnosis not present

## 2019-02-16 DIAGNOSIS — G20 Parkinson's disease: Secondary | ICD-10-CM | POA: Diagnosis not present

## 2019-02-16 DIAGNOSIS — I2510 Atherosclerotic heart disease of native coronary artery without angina pectoris: Secondary | ICD-10-CM | POA: Diagnosis not present

## 2019-02-16 DIAGNOSIS — G629 Polyneuropathy, unspecified: Secondary | ICD-10-CM | POA: Diagnosis not present

## 2019-02-16 DIAGNOSIS — R41841 Cognitive communication deficit: Secondary | ICD-10-CM | POA: Diagnosis not present

## 2019-02-16 DIAGNOSIS — F22 Delusional disorders: Secondary | ICD-10-CM | POA: Diagnosis not present

## 2019-02-16 DIAGNOSIS — R4182 Altered mental status, unspecified: Secondary | ICD-10-CM | POA: Diagnosis not present

## 2019-02-17 DIAGNOSIS — R41841 Cognitive communication deficit: Secondary | ICD-10-CM | POA: Diagnosis not present

## 2019-02-17 DIAGNOSIS — G20 Parkinson's disease: Secondary | ICD-10-CM | POA: Diagnosis not present

## 2019-02-17 DIAGNOSIS — R4182 Altered mental status, unspecified: Secondary | ICD-10-CM | POA: Diagnosis not present

## 2019-02-17 DIAGNOSIS — F22 Delusional disorders: Secondary | ICD-10-CM | POA: Diagnosis not present

## 2019-02-17 DIAGNOSIS — I2510 Atherosclerotic heart disease of native coronary artery without angina pectoris: Secondary | ICD-10-CM | POA: Diagnosis not present

## 2019-02-17 DIAGNOSIS — G629 Polyneuropathy, unspecified: Secondary | ICD-10-CM | POA: Diagnosis not present

## 2019-02-21 DIAGNOSIS — F22 Delusional disorders: Secondary | ICD-10-CM | POA: Diagnosis not present

## 2019-02-21 DIAGNOSIS — G629 Polyneuropathy, unspecified: Secondary | ICD-10-CM | POA: Diagnosis not present

## 2019-02-21 DIAGNOSIS — R41841 Cognitive communication deficit: Secondary | ICD-10-CM | POA: Diagnosis not present

## 2019-02-21 DIAGNOSIS — G20 Parkinson's disease: Secondary | ICD-10-CM | POA: Diagnosis not present

## 2019-02-21 DIAGNOSIS — R4182 Altered mental status, unspecified: Secondary | ICD-10-CM | POA: Diagnosis not present

## 2019-02-21 DIAGNOSIS — I2510 Atherosclerotic heart disease of native coronary artery without angina pectoris: Secondary | ICD-10-CM | POA: Diagnosis not present

## 2019-02-22 DIAGNOSIS — G629 Polyneuropathy, unspecified: Secondary | ICD-10-CM | POA: Diagnosis not present

## 2019-02-22 DIAGNOSIS — I2510 Atherosclerotic heart disease of native coronary artery without angina pectoris: Secondary | ICD-10-CM | POA: Diagnosis not present

## 2019-02-22 DIAGNOSIS — F22 Delusional disorders: Secondary | ICD-10-CM | POA: Diagnosis not present

## 2019-02-22 DIAGNOSIS — R41841 Cognitive communication deficit: Secondary | ICD-10-CM | POA: Diagnosis not present

## 2019-02-22 DIAGNOSIS — G20 Parkinson's disease: Secondary | ICD-10-CM | POA: Diagnosis not present

## 2019-02-22 DIAGNOSIS — R4182 Altered mental status, unspecified: Secondary | ICD-10-CM | POA: Diagnosis not present

## 2019-02-23 DIAGNOSIS — R41841 Cognitive communication deficit: Secondary | ICD-10-CM | POA: Diagnosis not present

## 2019-02-23 DIAGNOSIS — G20 Parkinson's disease: Secondary | ICD-10-CM | POA: Diagnosis not present

## 2019-02-23 DIAGNOSIS — I2510 Atherosclerotic heart disease of native coronary artery without angina pectoris: Secondary | ICD-10-CM | POA: Diagnosis not present

## 2019-02-23 DIAGNOSIS — R4182 Altered mental status, unspecified: Secondary | ICD-10-CM | POA: Diagnosis not present

## 2019-02-23 DIAGNOSIS — F22 Delusional disorders: Secondary | ICD-10-CM | POA: Diagnosis not present

## 2019-02-23 DIAGNOSIS — G629 Polyneuropathy, unspecified: Secondary | ICD-10-CM | POA: Diagnosis not present

## 2019-02-25 DIAGNOSIS — G629 Polyneuropathy, unspecified: Secondary | ICD-10-CM | POA: Diagnosis not present

## 2019-02-25 DIAGNOSIS — R41841 Cognitive communication deficit: Secondary | ICD-10-CM | POA: Diagnosis not present

## 2019-02-25 DIAGNOSIS — I2510 Atherosclerotic heart disease of native coronary artery without angina pectoris: Secondary | ICD-10-CM | POA: Diagnosis not present

## 2019-02-25 DIAGNOSIS — F22 Delusional disorders: Secondary | ICD-10-CM | POA: Diagnosis not present

## 2019-02-25 DIAGNOSIS — R4182 Altered mental status, unspecified: Secondary | ICD-10-CM | POA: Diagnosis not present

## 2019-02-25 DIAGNOSIS — G20 Parkinson's disease: Secondary | ICD-10-CM | POA: Diagnosis not present

## 2019-02-28 DIAGNOSIS — G629 Polyneuropathy, unspecified: Secondary | ICD-10-CM | POA: Diagnosis not present

## 2019-02-28 DIAGNOSIS — R4182 Altered mental status, unspecified: Secondary | ICD-10-CM | POA: Diagnosis not present

## 2019-02-28 DIAGNOSIS — F22 Delusional disorders: Secondary | ICD-10-CM | POA: Diagnosis not present

## 2019-02-28 DIAGNOSIS — G20 Parkinson's disease: Secondary | ICD-10-CM | POA: Diagnosis not present

## 2019-02-28 DIAGNOSIS — R41841 Cognitive communication deficit: Secondary | ICD-10-CM | POA: Diagnosis not present

## 2019-02-28 DIAGNOSIS — J45909 Unspecified asthma, uncomplicated: Secondary | ICD-10-CM | POA: Diagnosis not present

## 2019-02-28 DIAGNOSIS — Z7902 Long term (current) use of antithrombotics/antiplatelets: Secondary | ICD-10-CM | POA: Diagnosis not present

## 2019-02-28 DIAGNOSIS — I10 Essential (primary) hypertension: Secondary | ICD-10-CM | POA: Diagnosis not present

## 2019-02-28 DIAGNOSIS — I2510 Atherosclerotic heart disease of native coronary artery without angina pectoris: Secondary | ICD-10-CM | POA: Diagnosis not present

## 2019-02-28 DIAGNOSIS — Z9181 History of falling: Secondary | ICD-10-CM | POA: Diagnosis not present

## 2019-03-01 ENCOUNTER — Telehealth: Payer: Self-pay | Admitting: Neurology

## 2019-03-01 NOTE — Telephone Encounter (Signed)
Verbal orders given to change appointments to next week.

## 2019-03-01 NOTE — Telephone Encounter (Signed)
[facility] called needing to get Verbal Orders for this patient. Please Call. Thanks

## 2019-03-04 ENCOUNTER — Telehealth: Payer: Self-pay | Admitting: Neurology

## 2019-03-04 DIAGNOSIS — G20 Parkinson's disease: Secondary | ICD-10-CM | POA: Diagnosis not present

## 2019-03-04 DIAGNOSIS — R4182 Altered mental status, unspecified: Secondary | ICD-10-CM | POA: Diagnosis not present

## 2019-03-04 DIAGNOSIS — R41841 Cognitive communication deficit: Secondary | ICD-10-CM | POA: Diagnosis not present

## 2019-03-04 DIAGNOSIS — F22 Delusional disorders: Secondary | ICD-10-CM | POA: Diagnosis not present

## 2019-03-04 DIAGNOSIS — I2510 Atherosclerotic heart disease of native coronary artery without angina pectoris: Secondary | ICD-10-CM | POA: Diagnosis not present

## 2019-03-04 DIAGNOSIS — G629 Polyneuropathy, unspecified: Secondary | ICD-10-CM | POA: Diagnosis not present

## 2019-03-04 NOTE — Telephone Encounter (Signed)
Just FYI: the patient is all set for discharge from [facility]. Elizabeth at [facility] just wanted Dr. Carles Collet to be aware. Thanks!

## 2019-03-04 NOTE — Telephone Encounter (Signed)
FYI

## 2019-03-11 DIAGNOSIS — G20 Parkinson's disease: Secondary | ICD-10-CM | POA: Diagnosis not present

## 2019-03-11 DIAGNOSIS — G629 Polyneuropathy, unspecified: Secondary | ICD-10-CM | POA: Diagnosis not present

## 2019-03-11 DIAGNOSIS — R4182 Altered mental status, unspecified: Secondary | ICD-10-CM | POA: Diagnosis not present

## 2019-03-11 DIAGNOSIS — I2510 Atherosclerotic heart disease of native coronary artery without angina pectoris: Secondary | ICD-10-CM | POA: Diagnosis not present

## 2019-03-11 DIAGNOSIS — F22 Delusional disorders: Secondary | ICD-10-CM | POA: Diagnosis not present

## 2019-03-11 DIAGNOSIS — R41841 Cognitive communication deficit: Secondary | ICD-10-CM | POA: Diagnosis not present

## 2019-03-27 ENCOUNTER — Other Ambulatory Visit (HOSPITAL_COMMUNITY): Payer: Self-pay | Admitting: Cardiovascular Disease

## 2019-04-06 ENCOUNTER — Ambulatory Visit: Payer: Medicare Other | Admitting: Neurology

## 2019-04-14 NOTE — Progress Notes (Signed)
[name] [name] was seen today in the movement disorders clinic for neurologic consultation at the request of Wenda Low, MD.  The consultation is for the evaluation of gait instability for approximately 2-3 years and tremor in the right hand.  Pt states that he didn't even notice the tremor but Dr. [name] pointed it out to him.  States that balance is off especially when on uneven ground.  He feels that he is "wobbling."  He has not had falls but has had near falls.  Some paresthesias in the feet at night when wakes up but otherwise no.  No DM.  Did stop his crestor because thought that it was causing myalgia.  They just restarted but he is only taking 2 tablets a week.  He had increased LFT's on zocor The records that were made available to me were reviewed.  04/01/16 update:  The patient is following up today.  I last saw him in November.  At that point in time, I did think that some of his gait changes were from peripheral neuropathy.  I did lab work to rule out reversible causes and his B12 was low at 259.  I asked him to start taking an oral supplement and he states that he does that but forgets about 1/2 the time.  He also has a history of a parkinsonian tremor, without meeting the [place] brain bank criteria for Parkinsons disease.  That is primary reason I am following him up in 6 month intervals.   He states that he never has noted it on the L really and it doesn't bother him.  He has noted some tremor in the right leg.  He states that he did much better physically after going through PT.   I did review records since our last visit.  He recently saw cardiology on 03/27/2016.  He had atypical angina.  He is scheduled for an exercise stress Myoview.  04/14/17 update:  Pt seen today in follow up.  I haven't seen him in a year.  The records that were made available to me were reviewed.    He went through PT in the fall of last year.  States that he slipped on a wet concrete block.  He did injure his  ankle opened up the skin.  Has seen several physicians/practioners about it.  No fx to it.  He states that it has healed it.    Does c/o some word finding and memory retrieval.   States that he used to be able to do complex math and cannot do anymore.  If he sleeps better he does better.    10/15/17 update:  Pt seen in f/u for tremor.  Reports that tremor slightly worse.  It is the R arm.  Balance has not been as good but no falls.  He walks better if he is going fast.  He describes some festinating gait occasionally.  Describes inner tremor.  02/16/18 update:  Pt seen in f/u.  This patient is accompanied in the office by his spouse who supplements the history. Started on carbidopa/levodopa 25/100 tid last visit (misses lunch dosage 20% of the time but daughter put alarm on cell phone and it helps).  He gets nausea with the morning dosage.  Pt denies falls.  Pt denies lightheadedness, near syncope.  No hallucinations.  Mood has been good.  Walking for exercise or rides stationary bike for exericise.   The records that were made available to me were reviewed.  Pt has been keeping in contact with our Education officer, [employer].  ST at [email or web address] did call about her concern for memory change.  Pt previously declined neurocognitive testing.  Wife reports speech is much better.  Wife thinks that patients memory is pretty good but "he forgets some things."  Pt states still taking b12, but wife not sure.  He states that he is taking 5000 but then reports he hasn't taken it in few days!  07/22/18 update: Patient is seen today in follow-up for Parkinson's disease.  He is on carbidopa/levodopa 25/100, 1 tablet 3 times per day.  No falls.  No lightheadedness or near syncope.  He saw [facility] for sciatica on the left in the beginning of June and he was given a steroid Dosepak.  His pain went away.  He is under a lot of stress.  His wife had 2 strokes.  He is wanting to get back to playing tennis and asks me about that.  "I  have never fallen."  01/27/19 update: Patient is seen today in follow-up for Parkinson's disease, accompanied by his daughter who supplements the history.  Patient is on carbidopa/levodopa 25/100, 1 tablet 3 times per day.  Daughter states that they have an alarm on the phone but patient states that lately he may miss it "a couple of times per day."    He has had no lightheadedness or near syncope.  Dreams have been "wild."  He has yelled out and kicked some.  No one sleeps with the patient.  He did fall one time out of the bed.  He was seen on August 09, 2018 at [facility] for a [facility].  He was given a Tdap.  He had only a minor abrasion to the left forearm.  Other records are reviewed.  Seen by cardiology on February 27.  He is on aspirin and Plavix.  He is on amlodipine.  He was told to follow-up in 1 year.  He did recently have the n/v on Sunday.  Daughter states that he has not quite cognitively recovered.  Has been more paranoid.  More confused.  Not taking medication right.  Learned he was driving on expired license.  No longer driving.  Wife lives at home with him, but is not strong physically, but is mentally.  04/18/2019 update: Patient seen today in follow-up for Parkinson's disease with Parkinson's dementia.  This patient is accompanied in the office by his daughter who supplements the history.He is on carbidopa/levodopa 25/100, 1 tablet 3 times per day.  We did labs last visit because of increasing confusion.  There was 10-50,000 E. coli in the urine.  I faxed those to his primary care and asked his daughter to follow-up with the primary care.  Daughter states that he was tx for UTI.    He also underwent physical therapy, but the patient got overwhelmed with the number of visits and dropped them.   Daughter states that he "settled down" and the paranoia went completely away.  Some confusion and trouble communicating.  He recognizes when he has trouble.  Daughter states she fills the pill  box but the patient has trouble managing to take them even though alarms are set.  He does c/o "dreams being intense."  He will get out of bed because he is so scared of whatever happened in the dream.  He is not falling out of the bed.  PREVIOUS MEDICATIONS: none to date  ALLERGIES:   Allergies  Allergen Reactions  Hydromorphone Hcl      Dilaudid Rxed for renal calculi caused angioedema    Phenytoin Swelling   Zocor [Simvastatin - High Dose]     Elevated hepatic enzymes   Crestor [Rosuvastatin Calcium] Other (See Comments)    Leg pain. Not as bad since on a lower dose   Hydromorphone Hcl      Dilaudid Rxed for renal calculi caused angioedema   Statins     Leg pain    CURRENT MEDICATIONS:  Outpatient Encounter Medications as of 04/18/2019  Medication Sig   amLODipine (NORVASC) 10 MG tablet Take 1 tablet (10 mg total) by mouth daily.   aspirin 81 MG tablet Take 81 mg by mouth daily.     carbidopa-levodopa (SINEMET IR) 25-100 MG tablet Take 1 tablet by mouth 3 (three) times daily.   clopidogrel (PLAVIX) 75 MG tablet TAKE 1 TABLET BY MOUTH DAILY   fluticasone (FLONASE) 50 MCG/ACT nasal spray Place 2 sprays into both nostrils daily. --- Office visit needed for further refills   furosemide (LASIX) 20 MG tablet TK 2 TS PO ONCE A DAY   nitroGLYCERIN (NITROSTAT) 0.4 MG SL tablet Place 1 tablet (0.4 mg total) under the tongue every 5 (five) minutes as needed for chest pain.   pantoprazole (PROTONIX) 20 MG tablet Take 1 tablet by mouth daily.   clonazePAM (KLONOPIN) 0.5 MG tablet Take 0.5 tablets (0.25 mg total) by mouth at bedtime.   [DISCONTINUED] Glycerin-Hypromellose-PEG 400 0.2-0.2-1 % SOLN 1 drop into each eye daily as needed for dry eye   [DISCONTINUED] potassium chloride (K-DUR) 10 MEQ tablet TK 1 T PO ONCE A DAY WF   [DISCONTINUED] rosuvastatin (CRESTOR) 5 MG tablet Take 1 tablet by mouth daily on Monday, Wednesday and Friday. Please make appt with Dr. Burt Knack for  February for future refills. 1st attempt (Patient not taking: Reported on 04/18/2019)   No facility-administered encounter medications on file as of 04/18/2019.     PAST MEDICAL HISTORY:   Past Medical History:  Diagnosis Date   Asthma    post infectious   Bladder cancer ([place])    Dr Estill Dooms, [place]   CAD (coronary artery disease)    a. s/p Cypher DES x 2 to prox LAD and Cypher DES x 1 to distal LAD 08/2009;  b. cath with residual D2 50% 10/05 and normal EF;  c. Myoview 6/11: EF 60%, mild fixed INF, inferoseptal, inferoapical thinning, no ischemia, low risk, EF 60%   Colon polyps    GERD (gastroesophageal reflux disease)    HLD (hyperlipidemia)    h/o elevated LFTs on Zocor   Nephrolithiasis    Other abnormal glucose 2012   FBS 104   Skin cancer, basal cell    Dr Sherrye Payor    PAST SURGICAL HISTORY:   Past Surgical History:  Procedure Laterality Date   BLADDER TUMOR EXCISION  2011, 2012, 2013   Dr Estill Dooms, [place] EXTRACTION  2003   Bilateral, [facility] Ophth   Colonoscopy with polypectomy  2005   Dr. Verl Blalock   Coronary artery stenting  2005   Dr. Olevia Perches, 3 vessel   LITHOTRIPSY  2002   UPPER GASTROINTESTINAL ENDOSCOPY  2005   Esophageal reflux, hiatal hernia    SOCIAL HISTORY:   Social History   Socioeconomic History   Marital status: Married    Spouse name: Not on file   Number of children: Not on file   Years of education: Not on file  Highest education level: Not on file  Occupational History   Not on file  Social Needs   Financial resource strain: Not on file   Food insecurity:    Worry: Not on file    Inability: Not on file   Transportation needs:    Medical: Not on file    Non-medical: Not on file  Tobacco Use   Smoking status: Former Smoker    Types: Cigarettes    Last attempt to quit: 11/17/1960    Years since quitting: 58.4   Smokeless tobacco: Never Used   Tobacco comment: smoked 1948-1962, up to 1/2 ppd    Substance and Sexual Activity   Alcohol use: Yes    Alcohol/week: 10.0 standard drinks    Types: 10 Glasses of wine per week    Comment: wine @ dinner   Drug use: No   Sexual activity: Not on file  Lifestyle   Physical activity:    Days per week: Not on file    Minutes per session: Not on file   Stress: Not on file  Relationships   Social connections:    Talks on phone: Not on file    Gets together: Not on file    Attends religious service: Not on file    Active member of club or organization: Not on file    Attends meetings of clubs or organizations: Not on file    Relationship status: Not on file   Intimate partner violence:    Fear of current or ex partner: Not on file    Emotionally abused: Not on file    Physically abused: Not on file    Forced sexual activity: Not on file  Other Topics Concern   Not on file  Social History Narrative   Not on file    FAMILY HISTORY:   Family Status  Relation Name Status   Mother  Deceased at age 74       stroke   Father  Deceased       heart disease   PGM  Deceased   Brother  Deceased       ETOH, depression   Brother  Alive       healthy   Brother  Alive       healthy   Sister  Alive       healthy   Child  Alive       2, alive and well   Brother  (Not Specified)   Brother Engineer, agricultural (Not Specified)   MGF  Deceased   MGM  Deceased   PGF  Deceased    ROS:  Review of Systems  Unable to perform ROS: Dementia   PHYSICAL EXAMINATION:    VITALS:   Vitals:   04/18/19 1410  BP: (!) 159/69  Pulse: 67  Temp: 98.2 F (36.8 C)  SpO2: 96%  Weight: 140 lb 9.6 oz (63.8 kg)  Height: 5\' 6"  (1.676 m)    GEN:  The patient appears stated age and is in NAD. HEENT:  Normocephalic, atraumatic.  The mucous membranes are moist. The superficial temporal arteries are without ropiness or tenderness. CV:  RRR Lungs:  CTAB Neck/HEME:  There are no carotid bruits bilaterally.  Neurological  examination:  Orientation:  Montreal Cognitive Assessment  04/18/2019 02/16/2018  Visuospatial/ Executive (0/5) 1 0  Naming (0/3) 3 3  Attention: Read list of digits (0/2) 2 2  Attention: Read list of letters (0/1) 0 1  Attention: Serial 7 subtraction starting at  100 (0/3) 3 1  Language: Repeat phrase (0/2) 0 1  Language : Fluency (0/1) 0 0  Abstraction (0/2) 1 1  Delayed Recall (0/5) 1 0  Orientation (0/6) 6 6  Total 17 15  Adjusted Score (based on education) 17 15   Cranial nerves: There is good facial symmetry.  The speech is clear but he has some dysphasia. Soft palate rises symmetrically and there is no tongue deviation. Hearing is intact to conversational tone. Sensation: Sensation is intact to light intact.   Motor: Strength is 5/5 in the bilateral upper and lower extremities.   Shoulder shrug is equal and symmetric.  There is no pronator drift.   Movement examination: Tone: There is normal tone in the upper and lower extremities today Abnormal movements: There is no tremor noted Coordination:  There is no trouble with hand opening and closing, finger taps, toe taps bilaterally. Gait and Station: The patient has no difficulty arising out of a deep-seated chair without the use of the hands. The patient's stride length is good with good arm swing bilaterally.  He has no trouble in the turns.  Lab Results  Component Value Date   VITAMINB12 399 04/01/2016    Lab Results  Component Value Date   TSH 2.74 01/27/2019   Lab Results  Component Value Date   HGBA1C 4.7 09/18/2015     Chemistry      Component Value Date/Time   NA 142 01/27/2019 1405   K 3.7 01/27/2019 1405   CL 107 01/27/2019 1405   CO2 26 01/27/2019 1405   BUN 23 01/27/2019 1405   CREATININE 1.01 01/27/2019 1405      Component Value Date/Time   CALCIUM 8.8 01/27/2019 1405   ALKPHOS 116 07/30/2015 0737   AST 20 01/27/2019 1405   ALT 8 (L) 01/27/2019 1405   BILITOT 0.6 01/27/2019 1405     Addendum  labs.  Lab work is received from primary care physician dated July 21, 2018.  Sodium was 142, potassium 3.7, chloride 105, CO2 28, BUN 17 and creatinine 0.92.  Glucose was 98.  ASSESSMENT/PLAN:  1.  Gait instability, in part due to PN  -labs for reversible causes of PN negative, except mild B12 deficiency 2.  Parkinson's disease  -continue carbidopa/levodopa 25/100, 1 po tid. seems to be doing well with this dosage.  3.  B12 deficiency  -Continue supplement.   4.   PDD and wonder if combination of some AD  -We will put on the waiting list for neurocognitive testing with Dr. Melvyn Novas  -discussed need for direct medication management of meds.  There should absolutely be no self administration of medication.  Discussed with patient/daughter.  They both expressed understanding and both agreed with me. 5. REM behavior disorder  -This is becoming more bothersome to the patient.  Discussed with patient/daughter my worries about clonazepam and potentially making him more confused and if he gets up in the middle of the night, the risk for falls.  They would like to try it.  I was agreeable, but told him if it did not work after 1 night, they are to stop it.  We will try clonazepam, 0.5 mg, half a tablet at bedtime.  Discussed that this absolutely needs to be monitored.  That should not be within the patient's control.  Discussed that this is a controlled substance.  Understanding was expressed. 6.  Follow-up 6 to 8 months, sooner should new neurologic issues arise.  Much greater than 50% of  this visit was spent in counseling and coordinating care.  Total face to face time:  25 min, which did not include the extensive amount of time taken for the [place].

## 2019-04-18 ENCOUNTER — Encounter: Payer: Self-pay | Admitting: Neurology

## 2019-04-18 ENCOUNTER — Ambulatory Visit (INDEPENDENT_AMBULATORY_CARE_PROVIDER_SITE_OTHER): Payer: Medicare Other | Admitting: Neurology

## 2019-04-18 ENCOUNTER — Other Ambulatory Visit: Payer: Self-pay

## 2019-04-18 VITALS — BP 159/69 | HR 67 | Temp 98.2°F | Ht 66.0 in | Wt 140.6 lb

## 2019-04-18 DIAGNOSIS — G20 Parkinson's disease: Secondary | ICD-10-CM

## 2019-04-18 DIAGNOSIS — I2510 Atherosclerotic heart disease of native coronary artery without angina pectoris: Secondary | ICD-10-CM | POA: Diagnosis not present

## 2019-04-18 DIAGNOSIS — F0280 Dementia in other diseases classified elsewhere without behavioral disturbance: Secondary | ICD-10-CM | POA: Diagnosis not present

## 2019-04-18 DIAGNOSIS — G4752 REM sleep behavior disorder: Secondary | ICD-10-CM

## 2019-04-18 MED ORDER — CLONAZEPAM 0.5 MG PO TABS: 0.2500 mg | ORAL_TABLET | Freq: Every day | ORAL | 2 refills | Status: DC

## 2019-04-18 NOTE — Patient Instructions (Signed)
Start klonopin - 0.5 mg - cut the pill in HALF - and take at bedtime.  Monitor closely the next day for excessive confusion/sleepiness.  Call me with problems.  All medications should be directly monitored and given.  The physicians and staff at [facility] are committed to providing excellent care. You may receive a survey requesting feedback about your experience at our office. We strive to receive "very good" responses to the survey questions. If you feel that your experience would prevent you from giving the office a "very good " response, please contact our office to try to remedy the situation. We may be reached at [phone]. Thank you for taking the time out of your busy day to complete the survey.

## 2019-04-18 NOTE — Progress Notes (Signed)
MOCA test performed in clinic today. Results charted Total score 17

## 2019-05-03 ENCOUNTER — Other Ambulatory Visit: Payer: Self-pay

## 2019-05-03 MED ORDER — CARBIDOPA-LEVODOPA 25-100 MG PO TABS: 1.0000 | ORAL_TABLET | Freq: Three times a day (TID) | ORAL | 1 refills | Status: DC

## 2019-05-03 NOTE — Telephone Encounter (Signed)
Requested Prescriptions   Pending Prescriptions Disp Refills  . carbidopa-levodopa (SINEMET IR) 25-100 MG tablet 270 tablet 1    Sig: Take 1 tablet by mouth 3 (three) times daily.   Rx last filled:07/22/18 #270 1 refills Pt last seen:04/18/19  Follow up appt scheduled:10/18/19

## 2019-06-02 ENCOUNTER — Ambulatory Visit: Payer: Medicare Other | Admitting: Neurology

## 2019-06-10 ENCOUNTER — Telehealth: Payer: Self-pay | Admitting: Neurology

## 2019-06-10 NOTE — Telephone Encounter (Signed)
  Pt called with c/o:  confusion/hallucinations New medications?  No. When did they start?  For at least 6 months steady down hill If hallucinations are new, has patient been checked for infection, including UTI?  No. Pt daughter will try to have U/A test done.  Current medications prescribed by Dr. Carles Collet and TIMES taking the medications: carbidopa-levodopa 25/100 TID 9AM/1PM/5PM   Pt lives at  [place]

## 2019-06-10 NOTE — Telephone Encounter (Signed)
Notes reviewed, last saw Dr. Carles Collet last month. He had confusion and was found to have UTI, agree with having another UA with current mental status changes. Did he take the clonazepam prescribed on last visit, maybe stop if he is more confused and new in the past month.

## 2019-06-10 NOTE — Telephone Encounter (Signed)
Called spoke with daughter Mickel Baas she states that he has not had clonazepam in a while. Still working on getting urinalysis to check for UTI. She will contact the office once done.

## 2019-06-10 NOTE — Telephone Encounter (Signed)
Daughter has a question about his medication and thinks there needs to be an adjustment. Please call her back at [phone].

## 2019-06-21 DIAGNOSIS — H468 Other optic neuritis: Secondary | ICD-10-CM | POA: Diagnosis not present

## 2019-06-21 DIAGNOSIS — D2339 Other benign neoplasm of skin of other parts of face: Secondary | ICD-10-CM | POA: Diagnosis not present

## 2019-06-21 DIAGNOSIS — H35363 Drusen (degenerative) of macula, bilateral: Secondary | ICD-10-CM | POA: Diagnosis not present

## 2019-06-21 DIAGNOSIS — H35033 Hypertensive retinopathy, bilateral: Secondary | ICD-10-CM | POA: Diagnosis not present

## 2019-06-21 DIAGNOSIS — H353131 Nonexudative age-related macular degeneration, bilateral, early dry stage: Secondary | ICD-10-CM | POA: Diagnosis not present

## 2019-06-21 DIAGNOSIS — Z961 Presence of intraocular lens: Secondary | ICD-10-CM | POA: Diagnosis not present

## 2019-07-20 DIAGNOSIS — C679 Malignant neoplasm of bladder, unspecified: Secondary | ICD-10-CM | POA: Diagnosis not present

## 2019-08-05 DIAGNOSIS — H469 Unspecified optic neuritis: Secondary | ICD-10-CM | POA: Diagnosis not present

## 2019-08-05 DIAGNOSIS — Z961 Presence of intraocular lens: Secondary | ICD-10-CM | POA: Diagnosis not present

## 2019-08-05 DIAGNOSIS — H35033 Hypertensive retinopathy, bilateral: Secondary | ICD-10-CM | POA: Diagnosis not present

## 2019-08-05 DIAGNOSIS — H353131 Nonexudative age-related macular degeneration, bilateral, early dry stage: Secondary | ICD-10-CM | POA: Diagnosis not present

## 2019-08-14 ENCOUNTER — Emergency Department (HOSPITAL_COMMUNITY): Payer: Medicare Other

## 2019-08-14 ENCOUNTER — Emergency Department (HOSPITAL_COMMUNITY)
Admission: EM | Admit: 2019-08-14 | Discharge: 2019-08-14 | Disposition: A | Discharge status: Home / Self Care | Payer: Medicare Other | Attending: Emergency Medicine | Admitting: Emergency Medicine

## 2019-08-14 DIAGNOSIS — R4020 Unspecified coma: Secondary | ICD-10-CM | POA: Diagnosis not present

## 2019-08-14 DIAGNOSIS — F0281 Dementia in other diseases classified elsewhere with behavioral disturbance: Secondary | ICD-10-CM | POA: Diagnosis not present

## 2019-08-14 DIAGNOSIS — G3183 Dementia with Lewy bodies: Secondary | ICD-10-CM | POA: Diagnosis not present

## 2019-08-14 DIAGNOSIS — R55 Syncope and collapse: Secondary | ICD-10-CM | POA: Diagnosis not present

## 2019-08-14 DIAGNOSIS — G20 Parkinson's disease: Secondary | ICD-10-CM | POA: Insufficient documentation

## 2019-08-14 DIAGNOSIS — I10 Essential (primary) hypertension: Secondary | ICD-10-CM | POA: Diagnosis not present

## 2019-08-14 DIAGNOSIS — R29810 Facial weakness: Secondary | ICD-10-CM | POA: Diagnosis not present

## 2019-08-14 DIAGNOSIS — R404 Transient alteration of awareness: Secondary | ICD-10-CM | POA: Diagnosis not present

## 2019-08-14 DIAGNOSIS — R29818 Other symptoms and signs involving the nervous system: Secondary | ICD-10-CM | POA: Diagnosis not present

## 2019-08-14 DIAGNOSIS — R4182 Altered mental status, unspecified: Secondary | ICD-10-CM | POA: Diagnosis not present

## 2019-08-14 DIAGNOSIS — R479 Unspecified speech disturbances: Secondary | ICD-10-CM | POA: Insufficient documentation

## 2019-08-14 DIAGNOSIS — R4781 Slurred speech: Secondary | ICD-10-CM | POA: Diagnosis not present

## 2019-08-14 LAB — COMPREHENSIVE METABOLIC PANEL
ALT: 15 U/L (ref 0–44)
AST: 22 U/L (ref 15–41)
Albumin: 4.1 g/dL (ref 3.5–5.0)
Alkaline Phosphatase: 139 U/L — ABNORMAL HIGH (ref 38–126)
Anion gap: 11 (ref 5–15)
BUN: 21 mg/dL (ref 8–23)
CO2: 23 mmol/L (ref 22–32)
Calcium: 9.5 mg/dL (ref 8.9–10.3)
Chloride: 107 mmol/L (ref 98–111)
Creatinine, Ser: 1.05 mg/dL (ref 0.61–1.24)
GFR calc Af Amer: >60 mL/min (ref >60)
GFR calc non Af Amer: >60 mL/min (ref >60)
Glucose, Bld: 101 mg/dL — ABNORMAL HIGH (ref 70–99)
Potassium: 4.1 mmol/L (ref 3.5–5.1)
Sodium: 141 mmol/L (ref 135–145)
Total Bilirubin: 0.6 mg/dL (ref 0.3–1.2)
Total Protein: 6.7 g/dL (ref 6.5–8.1)

## 2019-08-14 LAB — DIFFERENTIAL
Abs Immature Granulocytes: 0.03 10*3/uL (ref 0.00–0.07)
Basophils Absolute: 0 10*3/uL (ref 0.0–0.1)
Basophils Relative: 0 %
Eosinophils Absolute: 0 10*3/uL (ref 0.0–0.5)
Eosinophils Relative: 0 %
Immature Granulocytes: 0 %
Lymphocytes Relative: 11 %
Lymphs Abs: 1 10*3/uL (ref 0.7–4.0)
Monocytes Absolute: 0.8 10*3/uL (ref 0.1–1.0)
Monocytes Relative: 9 %
Neutro Abs: 6.9 10*3/uL (ref 1.7–7.7)
Neutrophils Relative %: 80 %

## 2019-08-14 LAB — PROTIME-INR
INR: 1 (ref 0.8–1.2)
Prothrombin Time: 12.7 seconds (ref 11.4–15.2)

## 2019-08-14 LAB — I-STAT CHEM 8, ED
BUN: 22 mg/dL (ref 8–23)
Calcium, Ion: 1.08 mmol/L — ABNORMAL LOW (ref 1.15–1.40)
Chloride: 108 mmol/L (ref 98–111)
Creatinine, Ser: 0.9 mg/dL (ref 0.61–1.24)
Glucose, Bld: 93 mg/dL (ref 70–99)
HCT: 39 % (ref 39.0–52.0)
Hemoglobin: 13.3 g/dL (ref 13.0–17.0)
Potassium: 3.9 mmol/L (ref 3.5–5.1)
Sodium: 142 mmol/L (ref 135–145)
TCO2: 25 mmol/L (ref 22–32)

## 2019-08-14 LAB — CBC
HCT: 39.7 % (ref 39.0–52.0)
Hemoglobin: 13.8 g/dL (ref 13.0–17.0)
MCH: 34.2 pg — ABNORMAL HIGH (ref 26.0–34.0)
MCHC: 34.8 g/dL (ref 30.0–36.0)
MCV: 98.5 fL (ref 80.0–100.0)
Platelets: 198 10*3/uL (ref 150–400)
RBC: 4.03 MIL/uL — ABNORMAL LOW (ref 4.22–5.81)
RDW: 12.1 % (ref 11.5–15.5)
WBC: 8.7 10*3/uL (ref 4.0–10.5)
nRBC: 0 % (ref 0.0–0.2)

## 2019-08-14 LAB — CBG MONITORING, ED: Glucose-Capillary: 102 mg/dL — ABNORMAL HIGH (ref 70–99)

## 2019-08-14 LAB — APTT: aPTT: 26 seconds (ref 24–36)

## 2019-08-14 MED ORDER — SODIUM CHLORIDE 0.9% FLUSH: 3.0000 mL | Freq: Once | INTRAVENOUS | Status: DC

## 2019-08-14 NOTE — ED Notes (Signed)
Pt leff with daughter

## 2019-08-14 NOTE — Discharge Instructions (Signed)
1.  Follow-up with your neurologist and family doctor as soon as possible to continue evaluating the extent of Parkinson's disease and dementia influencing today's episode.

## 2019-08-14 NOTE — Consult Note (Signed)
NEURO HOSPITALIST CONSULT NOTE   Requesting physician: Dr. Johnney Killian  Reason for Consult: Unarousable during nap  History obtained from:  EMS and Chart     HPI:                                                                                                                                          [name] [name] is an 85 y.o. male with Parkinson's disease presenting via EMS as a Code Stroke for unresponsiveness. He was taking a nap at home when wife could not wake him up. She called EMS. On EMS arrival, the patient would not follow any commands and was nonverbal. There may have been some right facial droop. On arrival to the ED the patient was just starting to talk and had no facial droop. He was able to elevate BUE and BLE antigravity with no drift and without asymmetry.    PMHx Parkinson's disease per EMS Epic without listed medical history  No family history on file.            Social History:  has no history on file for tobacco, alcohol, and drug.  Not on File  HOME MEDICATIONS:                                                                                                                     May be on a Parkinson's medication beginning with "S" No home meds listed in Epic   ROS:                                                                                                                                       Unable to obtain due to  AMS.   Blood pressure (!) 158/73, pulse 65, temperature 98 F (36.7 C), temperature source Oral, resp. rate 16, weight 64.2 kg, SpO2 97 %.   General Examination:                                                                                                       Physical Exam  HEENT-  [place]/AT. Mild seborrheic dermatitis noted.  Lungs- Respirations unlabored Extremities- No edema  Neurological Examination Mental Status: Awake and initially nonverbal with flattened affect. Initially staring directly forward, he will gaze  at examiners and make eye contact after they attempt to speak with him.  Limited speech output is non-dysarthric. Answers to questions are short 1-3 word utterances, some of which are nonsensical. He is oriented to his first name but cannot recall his last name. Knows he is in [place], [place]. Does not know day or year, but does know the month. Able to follow simple motor commands but had difficulty following more complex commands such as FNF.  Cranial Nerves: II: Visual fields grossly intact with temporal peripheral fields tested as normal and with intact central vision. Exam limited by cognitive deficit. Pupils are equal.  III,IV, VI: No ptosis. EOM are full without nystagmus. Saccadic pursuits noted.  V,VII: Hypomimia noted. Can grimace but states he cannot "get it going" when asked to smile. Temp sensation is "I guess so" per patient.  VIII: Hearing intact to voice IX,X: Hypophonic speech.  XI: Head is at midline.  XII: Midline tongue extension Motor: Bradykinetic x 4 Increased tone x 4 with cogwheel rigidity BUE.  No pronator drift.  Resting tremor intermittently to bilateral hands.  5/5 strength BUE and BLE.  Sensory: Temp and light touch intact x 4 with equivocal findings when testing for extinction.  Deep Tendon Reflexes: 3+ bilateral brachioradialis and biceps. 3+ patellae bilaterally. 2+ right achilles. 0 left achilles.  Plantars: Tonically upgoing Cerebellar: No ataxia with FNF bilaterally. Bradykinesia noted.  Gait: Deferred   Lab Results: Basic Metabolic Panel: Recent Labs  Lab 08/14/19 1425  NA 142  K 3.9  CL 108  GLUCOSE 93  BUN 22  CREATININE 0.90    CBC: Recent Labs  Lab 08/14/19 1417 08/14/19 1425  WBC 8.7  --   NEUTROABS 6.9  --   HGB 13.8 13.3  HCT 39.7 39.0  MCV 98.5  --   PLT 198  --     Cardiac Enzymes: No results for input(s): CKTOTAL, CKMB, CKMBINDEX, TROPONINI in the last 168 hours.  Lipid Panel: No results for input(s): CHOL, TRIG, HDL,  CHOLHDL, VLDL, LDLCALC in the last 168 hours.  Imaging: Ct Head Code Stroke Wo Contrast  Result Date: 08/14/2019 CLINICAL DATA:  Code stroke.  Right facial droop.  Rule out stroke EXAM: CT HEAD WITHOUT CONTRAST TECHNIQUE: Contiguous axial images were obtained from the base of the skull through the vertex without intravenous contrast. COMPARISON:  None. FINDINGS: Brain: Moderate cerebral atrophy. Ventricular enlargement consistent with atrophy. Diffuse bilateral white matter disease is symmetric and likely chronic. No acute cortical infarct. Negative for  acute hemorrhage or mass. Vascular: Negative for hyperdense vessel. Atherosclerotic calcification in the cavernous carotid bilaterally. Skull: Negative Sinuses/Orbits: Negative Other: None ASPECTS ([place] Stroke Program Early CT Score) - Ganglionic level infarction (caudate, lentiform nuclei, internal capsule, insula, M1-M3 cortex): 7 - Supraganglionic infarction (M4-M6 cortex): 3 Total score (0-10 with 10 being normal): 10 IMPRESSION: 1. No acute intracranial abnormality. Moderate atrophy and moderate chronic microvascular ischemic change. 2. ASPECTS is 10 3. Preliminary results texted to Dr. Cheral Marker Electronically Signed   By: Franchot Gallo M.D.   On: 08/14/2019 14:33    Assessment: 85 year old male with Parkinson's disease, presenting with acute mental status change 1. Presentation most consistent with cognitive fluctuation secondary to Parkinson's with dementia. Abrupt cognitive fluctuations are not uncommon in the degenerative synucleopathies (e.g. Parkinson's disease, Lewy body dementia, some Parkinson's plus syndromes). No lateralized deficit or facial droop to suggest a stroke. His speech deficit is more consistent with confusion in the setting of dementia than an aphasia due to a focal lesion.  2. CT head: No acute intracranial abnormality. Moderate atrophy and moderate chronic microvascular ischemic change. ASPECTS is 10  3. Currently  improving.   Recommendations: 1. Monitor for continued improvement.  2. IVF 3. Assessment for possible infectious, toxic or metabolic contributing factors.  4. Discussed with Dr. Johnney Killian  Electronically signed: Dr. Kerney Elbe 08/14/2019, 2:55 PM

## 2019-08-14 NOTE — ED Triage Notes (Signed)
Pt here as a code stroke a, pt was taking a nap at home when wife could not wake him up , called ems , EMS called a code stroke due to not talking and slight right side facial drop , on arrival to ED no facial drop and pt started to talk

## 2019-08-14 NOTE — ED Provider Notes (Signed)
[facility] EMERGENCY DEPARTMENT Provider Note   CSN: [id number] Arrival date & time: 08/14/19  1412  An emergency department physician performed an initial assessment on this suspected stroke patient at 1415.  History   Chief Complaint Chief Complaint  Patient presents with  . Code Stroke    HPI [name] [name] is a 85 y.o. male.     HPI The patient has Parkinson's dementia.  He is more recently been having behavioral problems and there is been discussion about potentially moving to memory care unit.  Patient lives with his wife in assisted living.  She has intact mental capacity but is physically debilitated.  The report from EMS is that he was taking a nap and his wife was in the living room with him.  She tried to wake him up and he would not respond.  There was a caregiver present as well.  They were unable to get him to awaken.  On EMS arrival the patient was still fairly unresponsive but had normal blood pressure and pulses.  On route he started to respond to slightly more.  There was some report of a right sided facial droop at one point.  Patient was called for code stroke.  On arrival, I evaluated the patient and he was having difficulty answering any questions.  He was repeating the things I might say.  He could not reliably identify familiar objects.  He was also at that time evaluated by Dr. Cheral Marker for code stroke.  Dr. Cheral Marker felt this was atypical for code stroke and after reviewing the patient's history determined this is likely a facet of his parkinsonian dementia.  Patient's daughter by phone discussion was later able to add to the history.  She reports  that earlier in the day, there had been an incident where the patient had tried to go to another building and some of the attendants had to redirect him.  Apparently that escalated and he became borderline combative.  No injuries were reported from the episode.  He was escorted back to his room. No past  medical history on file.  There are no active problems to display for this patient.   The histories are not reviewed yet. Please review them in the "History" navigator section and refresh this [place].      Home Medications    Prior to Admission medications   Not on File    Family History No family history on file.  Social History Social History   Tobacco Use  . Smoking status: Not on file  Substance Use Topics  . Alcohol use: Not on file  . Drug use: Not on file     Allergies   Patient has no allergy information on record.   Review of Systems Review of Systems Level 5 caveat cannot obtain review of systems due to patient dementia.  Physical Exam Updated Vital Signs BP (!) 158/70   Pulse 65   Temp 98 F (36.7 C) (Oral)   Resp 15   Wt 64.2 kg   SpO2 97%   Physical Exam Constitutional:      Comments: Patient is awake and alert.  No respiratory distress.  HENT:     Head: Normocephalic and atraumatic.     Mouth/Throat:     Mouth: Mucous membranes are moist.     Pharynx: Oropharynx is clear.  Eyes:     Extraocular Movements: Extraocular movements intact.     Pupils: Pupils are equal, round, and reactive to  light.  Neck:     Musculoskeletal: Neck supple.  Cardiovascular:     Rate and Rhythm: Normal rate and regular rhythm.     Pulses: Normal pulses.  Pulmonary:     Effort: Pulmonary effort is normal.     Breath sounds: Normal breath sounds.  Abdominal:     General: There is no distension.     Palpations: Abdomen is soft.     Tenderness: There is no abdominal tenderness. There is no guarding.  Musculoskeletal: Normal range of motion.        General: No swelling or tenderness.     Right lower leg: No edema.     Left lower leg: No edema.  Skin:    General: Skin is warm and dry.  Neurological:     Comments: On first exam on arrival, the patient is awake and has eye contact.  He is slow to follow commands.  He is repeating certain words.  He is not  identifying simple objects.  No focal motor deficit is identified.  He will perform grip strength.  After reassessment after CT scan the patient is much more alert and talkative.  At this time he is speaking almost in a pressured voice.  He is getting out many thoughts and words.  He can follow commands for grip strength, elevation of extremities with no motor deficits.      ED Treatments / Results  Labs (all labs ordered are listed, but only abnormal results are displayed) Labs Reviewed  CBC - Abnormal; Notable for the following components:      Result Value   RBC 4.03 (*)    MCH 34.2 (*)    All other components within normal limits  COMPREHENSIVE METABOLIC PANEL - Abnormal; Notable for the following components:   Glucose, Bld 101 (*)    Alkaline Phosphatase 139 (*)    All other components within normal limits  CBG MONITORING, ED - Abnormal; Notable for the following components:   Glucose-Capillary 102 (*)    All other components within normal limits  I-STAT CHEM 8, ED - Abnormal; Notable for the following components:   Calcium, Ion 1.08 (*)    All other components within normal limits  PROTIME-INR  APTT  DIFFERENTIAL  CBG MONITORING, ED    EKG EKG Interpretation  Date/Time:  Sunday August 14 2019 14:34:54 EDT Ventricular Rate:  70 PR Interval:    QRS Duration: 97 QT Interval:  404 QTC Calculation: 436 R Axis:   45 Text Interpretation:  Sinus rhythm Borderline prolonged PR interval agree. no old comparison Confirmed by Charlesetta Shanks [phone]) on 08/14/2019 2:37:45 PM   Radiology Ct Head Code Stroke Wo Contrast  Result Date: 08/14/2019 CLINICAL DATA:  Code stroke.  Right facial droop.  Rule out stroke EXAM: CT HEAD WITHOUT CONTRAST TECHNIQUE: Contiguous axial images were obtained from the base of the skull through the vertex without intravenous contrast. COMPARISON:  None. FINDINGS: Brain: Moderate cerebral atrophy. Ventricular enlargement consistent with atrophy.  Diffuse bilateral white matter disease is symmetric and likely chronic. No acute cortical infarct. Negative for acute hemorrhage or mass. Vascular: Negative for hyperdense vessel. Atherosclerotic calcification in the cavernous carotid bilaterally. Skull: Negative Sinuses/Orbits: Negative Other: None ASPECTS ([place] Stroke Program Early CT Score) - Ganglionic level infarction (caudate, lentiform nuclei, internal capsule, insula, M1-M3 cortex): 7 - Supraganglionic infarction (M4-M6 cortex): 3 Total score (0-10 with 10 being normal): 10 IMPRESSION: 1. No acute intracranial abnormality. Moderate atrophy and moderate chronic microvascular ischemic change.  2. ASPECTS is 10 3. Preliminary results texted to Dr. Cheral Marker Electronically Signed   By: Franchot Gallo M.D.   On: 08/14/2019 14:33    Procedures Procedures (including critical care time)  Medications Ordered in ED Medications  sodium chloride flush (NS) 0.9 % injection 3 mL (has no administration in time range)     Initial Impression / Assessment and Plan / ED Course  I have reviewed the triage vital signs and the nursing notes.  Pertinent labs & imaging results that were available during my care of the patient were reviewed by me and considered in my medical decision making (see chart for details).       Consult: Dr. Cheral Marker has seen the patient arrives as a code stroke.  Patient's presentation was atypical for stroke with a global episode of poor responsiveness.  This gradually resolved.  After getting additional history from the patient's daughter regarding this episode of confrontation this morning and the patient's significant history of dementia with some behavioral issues, it appears more likely this is a geriatric psychiatric presentation related to his Parkinson's dementia.  After the patient had become much more alert and talkative, he began focusing on issues of persecution.  He reported that there were 4 people trying to kill him.   He did not seem to recall that his wife had been at his side in his apartment.  He reported that she "must have been the ones who had to leave early".  Patient's daughter advises that this is not atypical.  They are dealing with incrementally more pronounced behavioral issues.  They are discussing the need to move to memory care unit.  At this time, it appears the patient is back to what is his baseline.  Patient's daughter will be coming to the emergency department to see if he is at his baseline and most likely take him home for continued care at assisted living with close supervision and possible increased level of care.  Final Clinical Impressions(s) / ED Diagnoses   Final diagnoses:  Altered mental status, unspecified altered mental status type  Dementia due to Parkinson's disease with behavioral disturbance [facility])    ED Discharge Orders    None       Charlesetta Shanks, MD 08/14/19 1547

## 2019-08-14 NOTE — ED Notes (Signed)
Speech Is very hard to understand I cannot see a pattern in what the pt is trying to tell me  Alert moves all 4 extremities

## 2019-08-15 ENCOUNTER — Telehealth: Payer: Self-pay | Admitting: Neurology

## 2019-08-15 DIAGNOSIS — F0280 Dementia in other diseases classified elsewhere without behavioral disturbance: Secondary | ICD-10-CM

## 2019-08-15 DIAGNOSIS — R413 Other amnesia: Secondary | ICD-10-CM

## 2019-08-15 NOTE — Telephone Encounter (Signed)
Referral place Please schedule  Thanks,Padraic Marinos

## 2019-08-15 NOTE — Telephone Encounter (Signed)
Patient's daughter called regarding her dad and needing to see if his medication can be changed. She said his delusions are getting worse and that he ended up in the hospital yesterday. She is wondering if his Dopamine medication can be switched to an Antipsychotic medication? Please Call [phone]. Thank you

## 2019-08-15 NOTE — Telephone Encounter (Signed)
There is no referral in system for the Neuro testing- please send [place] that and we will schedule.   Also, I spoke with daughter she said she is more comfortable with him coming into the office and wanting him scheduled that way. He is scheduled to come in on 08/25/19 at 2:30. Let me know if I need to change anything.   Thanks!

## 2019-08-15 NOTE — Telephone Encounter (Signed)
1.  Schedule video visit with daughter present 2.  Find out why neurocog testing not done yet

## 2019-08-15 NOTE — Telephone Encounter (Signed)
ALL [facility] DPR SIGNED 01/27/19-OK TO TALK TO DAUGHTER [name] AND WIFE [name]. OK TO LEAVE DETAILED MSG AT HOME [phone].  09-25-15 DPR SIGNED FOR [facility] OK TO DISCLOSE PHI BY PHONE TO [name] [phone] OK TO HOME AND WORK [employer]

## 2019-08-15 NOTE — Telephone Encounter (Signed)
Pt called with c/o:  confusion/hallucinations New medications?  No. When did they start?  N/A If hallucinations are new, has patient been checked for infection, including UTI?  Yes.  Results negative at Urology Current medications prescribed by Dr. Carles Collet and TIMES taking the medications: CarbIdidopa-Levodopa 25/100 8am/1pm/5pm  Pt was very agatied tried to fight the facility staff yesterday. Once they got him calm he took a nap and staff was unable to wake him, EMS was called and they was also unable to wait him. Patient was then taken to [facility]) Pt daughter wondering if patient should be on some antipsychotic medication.  He is delusional and still having dreams   Pt is not taken clonazepam

## 2019-08-22 DIAGNOSIS — D494 Neoplasm of unspecified behavior of bladder: Secondary | ICD-10-CM | POA: Diagnosis not present

## 2019-08-22 DIAGNOSIS — C679 Malignant neoplasm of bladder, unspecified: Secondary | ICD-10-CM | POA: Diagnosis not present
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
## 2019-08-24 ENCOUNTER — Other Ambulatory Visit: Payer: Self-pay

## 2019-08-24 ENCOUNTER — Encounter: Payer: Self-pay | Admitting: Psychology

## 2019-08-24 ENCOUNTER — Ambulatory Visit: Payer: Medicare Other | Admitting: Psychology

## 2019-08-24 ENCOUNTER — Ambulatory Visit (INDEPENDENT_AMBULATORY_CARE_PROVIDER_SITE_OTHER): Payer: Medicare Other | Admitting: Psychology

## 2019-08-24 DIAGNOSIS — F0280 Dementia in other diseases classified elsewhere without behavioral disturbance: Secondary | ICD-10-CM | POA: Diagnosis not present

## 2019-08-24 DIAGNOSIS — G20 Parkinson's disease: Secondary | ICD-10-CM

## 2019-08-24 DIAGNOSIS — R413 Other amnesia: Secondary | ICD-10-CM

## 2019-08-24 NOTE — Progress Notes (Signed)
   Neuropsychology Note   [name] [name] completed 60 minutes of neuropsychological testing with technician, Milana Kidney, B.S., under the supervision of Dr. Christia Reading, Ph.D., licensed neuropsychologist. The patient did not appear overtly distressed by the testing session, per behavioral observation or via self-report to the technician. Rest breaks were offered.    In considering the patient's current level of functioning, level of presumed impairment, nature of symptoms, emotional and behavioral responses during the interview, level of literacy, and observed level of motivation/effort, a battery of tests was selected and communicated to the psychometrician.   Communication between the psychologist and technician was ongoing throughout the testing session and changes were made as deemed necessary based on patient performance on testing, technician observations and additional pertinent factors such as those listed above.   [name] will return within approximately two weeks for an interactive feedback session with Dr. Melvyn Novas at which time his test performances, clinical impressions, and treatment recommendations will be reviewed in detail. The patient understands he can contact our office should he require our assistance before this time.   Full report to follow.  60 minutes were spent face-to-face with patient administering standardized tests and 15 minutes were spent scoring (technician). [CPT [id number], [id number]

## 2019-08-24 NOTE — Progress Notes (Addendum)
NEUROPSYCHOLOGICAL EVALUATION [place]. [facility] Department of Neurology  Reason for Referral:   [name] [name] is a 85 y.o. Caucasian male referred by Alonza Bogus, D.O., to characterize his current cognitive functioning and assist with diagnostic clarity regarding Parkinson's disease dementia vs. Alzheimer's disease.   Assessment and Plan:   Clinical Impression(s): Overall, [name] [name] pattern of performance is suggestive of diffuse cognitive impairment in the severely impaired normative ranges. A relative strength was exhibited across a single numerical-based task assessing basic attention. However, performance across domains of processing speed, complex attention, executive functioning, receptive and expressive language, visuospatial abilities, and new learning and memory were notably impaired. Overall notable cognitive dysfunction, coupled with report of difficulties completing instrumental ADLs, [name] [name] meets criteria for a Major Neurocognitive Disorder (formerly referred to as dementia) at the present time.  Given his history of Parkinson's disease, the most apparent etiology for his cognitive deficits is a Parkinson's disease dementia presentation. However, given his relatively mild Parkinson's disease symptoms, there is a good likelihood that he exhibits a mixed dementia presentation with comorbid Alzheimer's disease. Unfortunately, on learning trials across memory tests, [name] [name] was unable to demonstrate the ability to learn this information, even with repeated exposure. As such, while he exhibited an amnestic memory profile with poor performance across recognition tasks, it is likely that this information was never learned in the first place. As such, given the diffuse and severe nature of his cognitive impairment, I cannot firmly rule in the presence of comorbid Alzheimer's disease based on the results of today's evaluation.   Recommendations: While  [name] [name] already resides in an assisted living facility, his family and caregivers should explore the option of moving him to a memory care unit based on his degree of cognitive impairment. [name] [name] likely requires 24 hour supervision to ensure his continued safety and proper medication adherence.   It will be important for [name] [name] to have another person with him when in situations where he may need to process information, weigh the pros and cons of different options, and make decisions, in order to ensure that he fully understands and recalls all information to be considered. Important information should be provided in a written format so that [name] [name] can refer to it later and not rely on memory abilities.   Across mood questionnaires, [name] [name] endorsed severe symptoms of both anxiety and depression. As such, his medical team should consider medication adjustments to improve these symptoms as much as possible. Improvements in psychiatric distress could yield very mild improvements in overall cognitive functioning.   He additionally reported moderate sleep dysfunction and his medical team is also encouraged to consider additional ways to improve the overall duration and quality of his sleep.  Review of Records:   [name] [name] was seen by [facility] [name] [name] Guiles Tat, D.O.) on 04/18/2019 for an evaluation of gait instability and right hand tremor. Regarding the former, [name] [name] reported poor balance, especially on uneven ground, feeling like he is "wobbling." This was said to be ongoing for the last 2-3 years. A history of falls was denied. On 02/16/2018, [name] [name] wife stated her belief that his memory was "pretty good." He declined neurocognitive testing at that time despite a speech therapist from their assisted living facility expressing concern regarding memory decline. On 01/27/2019, Dr. Carles Collet noted that [name] [name] described his dreams as "wild," including yelling and kicking  behaviors, and falling out of bed one time while asleep. Increased confusion  and paranoia was also noted. Finally, on 04/18/2019, Dr. Carles Collet noted ongoing confusion and trouble communicating. [name] [name] also was noted to have trouble with medication management despite using a pillbox and alarms being set. Dreams continued to be described as "intense" and [name] [name] reported sometimes getting out of bed because he is so scared of whatever happened in the dream. Performance on a brief cognitive screening instrument [facility]) was in the abnormal range (17/30). Points were lost across the following domains: visuospatial/executive (1/5), letter reading (0/1), sentence repetition (0/2), verbal fluency (0/1), verbal abstraction (1/2), and delayed recall (1/5). Ultimately, [name] [name] was referred for a comprehensive neuropsychological evaluation to characterize his cognitive abilities and to assist with diagnostic clarity.   Head CT on 08/14/2019 revealed moderate atrophy and moderate chronic microvascular ischemic changes.   Past Medical History:  Diagnosis Date   Asthma    Bladder cancer ([place])    CAD (coronary artery disease)    a. s/p Cypher DES x 2 to prox LAD and Cypher DES x 1 to distal LAD 08/2009;  b. cath with residual D2 50% 10/05 and normal EF;  c. Myoview 6/11: EF 60%, mild fixed INF, inferoseptal, inferoapical thinning, no ischemia, low risk, EF 60%   Colon polyps    Essential hypertension, benign 06/16/2012   GERD (gastroesophageal reflux disease)    HLD (hyperlipidemia)    h/o elevated LFTs on Zocor   Nephrolithiasis    Other abnormal glucose 2012   FBS 104   Parkinson's disease ([place]) 02/16/2018   Skin cancer, basal cell    Dr Sherrye Payor    Past Surgical History:  Procedure Laterality Date   BLADDER TUMOR EXCISION  2011, 2012, 2013   Dr Estill Dooms, [place]  2003   Bilateral, [facility]   Colonoscopy with polypectomy  2005   Dr. Verl Blalock   Coronary  artery stenting  2005   Dr. Olevia Perches, 3 vessel   LITHOTRIPSY  2002   UPPER GASTROINTESTINAL ENDOSCOPY  2005   Esophageal reflux, hiatal hernia    Family History  Problem Relation Age of Onset   Hypertension Mother    Stroke Mother        in 61s   Heart attack Mother        75   Coronary artery disease Father        44s   Heart disease Paternal Grandmother        in 28s   Asthma Brother    Diabetes Brother    Alcohol abuse Brother    Prostate cancer Maternal Grandfather      Current Outpatient Medications:    amLODipine (NORVASC) 10 MG tablet, Take 1 tablet (10 mg total) by mouth daily., Disp: 90 tablet, Rfl: 3   aspirin 81 MG tablet, Take 81 mg by mouth daily.  , Disp: , Rfl:    carbidopa-levodopa (SINEMET IR) 25-100 MG tablet, Take 1 tablet by mouth 3 (three) times daily., Disp: 270 tablet, Rfl: 1   clonazePAM (KLONOPIN) 0.5 MG tablet, Take 0.5 tablets (0.25 mg total) by mouth at bedtime., Disp: 15 tablet, Rfl: 2   clopidogrel (PLAVIX) 75 MG tablet, TAKE 1 TABLET BY MOUTH DAILY, Disp: 90 tablet, Rfl: 2   fluticasone (FLONASE) 50 MCG/ACT nasal spray, Place 2 sprays into both nostrils daily. --- Office visit needed for further refills, Disp: 16 g, Rfl: 0   furosemide (LASIX) 20 MG tablet, TK 2 TS PO ONCE A DAY, Disp: , Rfl: 3  nitroGLYCERIN (NITROSTAT) 0.4 MG SL tablet, Place 1 tablet (0.4 mg total) under the tongue every 5 (five) minutes as needed for chest pain., Disp: 25 tablet, Rfl: 3   pantoprazole (PROTONIX) 20 MG tablet, Take 1 tablet by mouth daily., Disp: , Rfl: 11  Clinical Interview:   Cognitive Symptoms: Decreased short-term memory: Endorsed. Expressed memory concerns centered around trouble recalling words to use during conversation. Additional examples included trouble remembering the details of previous events, as well as names of individuals.  Decreased [name]-term memory: Denied. Decreased attention/concentration: Largely denied. Mr. Macnaughton  described these abilities as "fair" overall, stating that if he views them as important, he can maintain his attention appropriately. 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  Increased ease of distractibility: Denied. Reduced processing speed: Endorsed. More specifically, he noted that his mind feels more "jumbled" lately.  Difficulties with executive functions: Endorsed. Difficulties with organization were described as longstanding in nature. Mr. Bicksler stated that decision making seemed stable. Instances of impulsivity were endorsed; however, these were largely said to appear during periods of confusion.  Difficulties with emotion regulation: Denied. Difficulties with receptive language: Endorsed. Difficulties were attributed to Mr. Sgro being unsure of the vocabulary used during conversation. He later acknowledged some difficulties with comprehension above and beyond the words utilized during conversation. Difficulties with word finding: Endorsed. He also attempted to describe specific difficulties with letters rather than the words themselves. However, his thought processes when describing this phenomenon were difficult to follow and fully comprehend.  Decreased visuoperceptual ability: Endorsed. His daughter noted trouble ambulating up steps or curbs due to depth perception issues; he largely agreed with this assessment.  Trajectory of deficits: Cognitive deficits were described to have exhibited a gradual decline over the past several years.   Difficulties completing ADLs: Endorsed. Mr. Luisi lives in an assisted living facility where several ADLs (e.g., medication management) are performed for him. He also no longer drives, partially due to cognitive concerns.   Additional Medical History: History of traumatic brain injury/concussion: Denied. History of stroke: Denied. History of seizure  activity: Denied. History of known exposure to toxins: Denied. Symptoms of chronic pain: Denied outside of mild symptoms of arthritis in his hands. Experience of frequent headaches/migraines: Denied. Frequent instances of dizziness/vertigo: Denied.  Sensory changes: Endorsed. Mr. Rudisill endorsed needing glasses and has trouble with visual acuity. He also noted diminished hearing. He had been previously prescribed hearing aids, but was unsure of their location currently.  Balance/coordination difficulties: Endorsed. See previous sections. Other motor difficulties: Endorsed. See previous sections.  Other medical conditions: Mr. Lyden daughter noted that he has a history of 3 cardiac stents, as well as recently treated bladder cancer.   Sleep History: Estimated hours obtained each night: Mr. Jarosz was unable to provide a numerical estimation. His daughter noted that he frequently makes comments that his sleep is poor.  Difficulties falling asleep: Denied. Difficulties staying asleep: Endorsed. In addition to waking to use the restroom, he reported the presence of very vivid and often distressing dreams, often causing him to awake during the night and get out of bed due to being concerned over dream content.  Feels rested and refreshed upon awakening: Variably, depending on the presence of sleep disruptions the night before.  History of snoring: Denied. History of waking up gasping for air: Denied. Witnessed breath cessation while asleep: Denied.  History of vivid dreaming: Endorsed. Excessive  movement while asleep: Endorsed. Instances of acting out his dreams: Endorsed. Medical records with Dr. Carles Collet suggest the presence of a REM sleep disorder which has been increasing in severity.   Psychiatric/Behavioral Health History: Depression: Unclear. Mr. Stranz was unable to provide a specific response surrounding his mood outside of stating that "it depends" on other factors in his life. His  daughter expressed her belief that he might experience some difficulties with depression. Current or remote suicidal ideation, intent, or plan was denied.  Anxiety: Denied. Mania: Denied. Trauma History: Denied. Visual/auditory hallucinations: Endorsed. Visual hallucinations were said to occur, both in and out of the context of sleep. Outside of sleep, Mr. Arquilla daughter noted that he has often commented seeing people who were not there, as well as feeling as though there are several more people in the room than are physically present. Delusional thoughts: Endorsed. Recently, Mr. Hickox daughter described a situation where Mr. Shong experienced confusion, and attempted to enter a building in his assisted living facility without a face mask (COVID-19 pandemic). When stopped by a facility employee, he became concerned that this individual was attempting to harm him and became mildly combative. Instances of confusion and paranoia were said to have increased over the recent months.   Tobacco: Denied. Alcohol: Mr. Reum denied current alcohol consumption, as well as a history of problematic alcohol use, abuse, or dependence.  Recreational drugs: Denied.  Academic/Vocational History: Highest level of educational attainment: 16 years. Mr. Chessman completed high school and earned a Bachelor's degree. He described himself as a very strong student in academic settings.  History of developmental delay: Denied. History of grade repetition: Denied. History of class failures: Denied. Enrollment in special education courses: Denied. History of diagnosed specific learning disability: Denied. History of ADHD: Denied.  Employment: Retired. Mr. 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   Evaluation Results:   Behavioral Observations: Mr. Drews was accompanied by his daughter, arrived to his appointment on time, and was appropriately dressed and groomed. Observed  gait was shuffling, but he ambulated at an appropriate speed and without noted balance instability. Gross motor functioning appeared intact upon informal observation and no abnormal movements (e.g., tremors) were noted. His affect was generally relaxed and positive, but did range appropriately given the subject being discussed during the clinical interview or the task at hand during testing procedures. Speech was generally fluent. However, word finding difficulties were quite pronounced. Additionally, speech appeared to get jumbled at times, making it difficult to understand. Thought processes were notably tangential at times and Mr. Busby occasionally answered questions with unrelated responses. He also appeared to perseverate on certain topics (e.g., physical exercise). During testing, sustained attention was appropriate throughout. Task engagement was adequate. However, he became easily confused when being rad task instructions and several tasks had to be discontinued for this reason. Overall, Mr. Zanfardino was cooperative with the clinical interview and subsequent testing procedures.   Adequacy of Effort: The validity of neuropsychological testing is limited by the extent to which the individual being tested may be assumed to have exerted adequate effort during testing. Mr. Rhyan expressed his intention to perform to the best of his abilities and exhibited adequate task engagement and persistence. However, scores across stand-alone and embedded performance validity measures were consistently below expectation. This is believed to represent true cognitive decline rather than poor engagement or validity concerns. As such, the results of the current evaluation are believed to be a valid  representation of Mr. Tutwiler current cognitive functioning.  Test Results: Mr. Healan was notably disoriented at the time of the current evaluation. Points were lost for him stating his incorrect age (he reported that he  was 85 years old; 0/2), being unable to state his address (0/2), being unable to state his phone number (0/3), stating the incorrect year (he stated "24;" 0/2), incorrect date (he stated "50;" 0/2), and incorrect time (1/2), being unaware of his current location (0/2) or current city (0/1), and being unaware of the reason for his appointment (0/1).   Intellectual abilities based upon educational and vocational attainment were estimated to be in the average range. Premorbid abilities were estimated to be within the average range based upon a single-word reading test.   Processing speed was exceptionally low. Basic attention was average and represented a relative strength. More complex attention (e.g., working memory) was exceptionally low. Performance across tasks assessing executive functions (e.g., cognitive flexibility) were exceptionally low.  While not directly assessed, receptive language abilities are believed to be impaired as Mr. Reynaga exhibited notable difficulties comprehending task instructions and answered questions in a tangential and often unrelated manner during testing. Assessed expressive language (e.g., verbal fluency and confrontation naming) was exceptionally low.     Assessed visuospatial/visuoconstructional abilities were impaired. Points were lost on his drawing of a clock due to poor numerical representation (he included numbers 1 through 9 in largely appropriate places within the clock face, with 10, 11, and 12 occurring outside of the clock) and was unable to estimate hand placement. His copy of a complex figure was notable for significant spatial disorganization and impaired spatial relationships.    Learning (i.e., encoding) of novel verbal and visual information was exceptionally low. Spontaneous delayed recall (i.e., retrieval) of previously learned information was exceptionally low. Retention rates were poor across memory measures. Performance across recognition tasks was  likewise poor, suggesting very limited evidence for appropriate information consolidation.   Results of emotional screening instruments suggested that recent symptoms of generalized anxiety were in the severe range, while symptoms of depression were also within the severe range. A screening instrument assessing recent sleep quality suggested the presence of moderate sleep dysfunction.  Tables of Scores:   Note: This summary of test scores accompanies the interpretive report and should not be considered in isolation without reference to the appropriate sections in the text. Descriptors are based on appropriate normative data and may be adjusted based on clinical judgment. The terms impaired and within normal limits (WNL) are used when a more specific level of functioning cannot be determined.       Effort Testing:   DESCRIPTOR       RBANS Effort Index: --- --- Below Expectation       Orientation:      Raw Score Percentile   NAB Orientation, Form 1 13/29 <1 Exceptionally Low       Cognitive Screening:          RBANS, Form A Standard Score/ Scaled Score Percentile   Total Score 46 <1 Exceptionally Low  Immediate Memory 40 <1 Exceptionally Low    List Learning 1 <1 Exceptionally Low    Story Memory 1 <1 Exceptionally Low  Visuospatial/Constructional 50 <1 Exceptionally Low    Figure Copy 1 <1 Exceptionally Low    Line Orientation 0/20 <2 Exceptionally Low  Language 51 <1 Exceptionally Low    Picture Naming 6/10 <2 Exceptionally Low    Semantic Fluency 1 <1 Exceptionally Low  Attention 68  2 Exceptionally Low    Digit Span 9 37 Average    Coding 1 <1 Exceptionally Low  Delayed Memory 40 <1 Exceptionally Low    List Recall 0/10 <2 Exceptionally Low      List Recognition 9/20 <2 Exceptionally Low    Story Recall 3 1 Exceptionally Low      Story Recognition 4/12 <1 Exceptionally Low    Figure Recall 1 <1 Exceptionally Low       Intellectual Functioning:           Standard Score  Percentile   Test of Premorbid Functioning: 103 58 Average       Attention/Executive Function:          Trail Making Test (TMT): Raw Score (T Score) Percentile     Part A Discontinued --- Impaired    Part B Discontinued --- Impaired       Language:          Verbal Fluency Test: Raw Score (Z-Score) Percentile     Phonemic Fluency (FAS) 6 (-2.77) <1 Exceptionally Low    Animal Fluency 3 (-3.09) <1 Exceptionally Low  *Based on [employer] Older Normative Studies (MOANS)          NAB Language Module, Form 1: T Score Percentile     Naming 19 <1 Exceptionally Low       Visuospatial/Visuoconstruction:      Raw Score Percentile   Clock Drawing: 2/10 --- Impaired       Mood and Personality:      Raw Score Percentile   Geriatric Depression Scale: 22 --- Severe  Geriatric Anxiety Scale: 46 --- Severe    Somatic 17 --- Severe    Cognitive 14 --- Severe    Affective 15 --- Severe       Additional Questionnaires:      Raw Score Percentile   PROMIS Sleep Disturbance Questionnaire: 34 --- Moderate   Informed Consent and Coding/Compliance:   Mr. Sellen was provided with a verbal description of the nature and purpose of the present neuropsychological evaluation. Also reviewed were the foreseeable risks and/or discomforts and benefits of the procedure, limits of confidentiality, and mandatory reporting requirements of this provider. The patient was given the opportunity to ask questions and receive answers about the evaluation. Oral consent to participate was provided by the patient.   This evaluation was conducted by Christia Reading, Ph.D., licensed clinical neuropsychologist. Mr. Bagent completed a 30-minute clinical interview, billed as one unit [phone], and 75 minutes of cognitive testing, billed as one unit [phone] and two additional units [phone]. Psychometrist Milana Kidney, B.S., assisted Dr. Melvyn Novas with test administration and scoring procedures. As a separate and discrete service, Dr. Melvyn Novas  spent a total of 180 minutes in interpretation and report writing, billed as one unit [id number] and two units [id number].

## 2019-08-25 ENCOUNTER — Encounter: Payer: Self-pay | Admitting: Psychology

## 2019-08-25 ENCOUNTER — Ambulatory Visit (INDEPENDENT_AMBULATORY_CARE_PROVIDER_SITE_OTHER): Payer: Medicare Other | Admitting: Neurology

## 2019-08-25 ENCOUNTER — Encounter: Payer: Self-pay | Admitting: Neurology

## 2019-08-25 VITALS — BP 155/74 | HR 73 | Ht 65.0 in | Wt 139.0 lb

## 2019-08-25 DIAGNOSIS — F0281 Dementia in other diseases classified elsewhere with behavioral disturbance: Secondary | ICD-10-CM

## 2019-08-25 DIAGNOSIS — G20 Parkinson's disease: Secondary | ICD-10-CM | POA: Insufficient documentation

## 2019-08-25 DIAGNOSIS — I2510 Atherosclerotic heart disease of native coronary artery without angina pectoris: Secondary | ICD-10-CM | POA: Diagnosis not present

## 2019-08-25 DIAGNOSIS — F0280 Dementia in other diseases classified elsewhere without behavioral disturbance: Secondary | ICD-10-CM | POA: Insufficient documentation

## 2019-08-25 MED ORDER — DONEPEZIL HCL 5 MG PO TABS: 5.0000 mg | ORAL_TABLET | Freq: Every day | ORAL | 0 refills | Status: DC

## 2019-08-25 MED ORDER — DONEPEZIL HCL 10 MG PO TABS: 10.0000 mg | ORAL_TABLET | Freq: Every day | ORAL | 1 refills | Status: DC

## 2019-08-25 NOTE — Patient Instructions (Signed)
1.  Start donepezil - 5 mg  - 1 daily for a month and then increase to donepezil - 10 mg daily

## 2019-09-08 ENCOUNTER — Encounter: Payer: Medicare Other | Admitting: Psychology

## 2019-09-13 ENCOUNTER — Encounter: Payer: Self-pay | Admitting: Psychology

## 2019-09-13 ENCOUNTER — Ambulatory Visit (INDEPENDENT_AMBULATORY_CARE_PROVIDER_SITE_OTHER): Payer: Medicare Other | Admitting: Psychology

## 2019-09-13 ENCOUNTER — Other Ambulatory Visit: Payer: Self-pay

## 2019-09-13 DIAGNOSIS — G20 Parkinson's disease: Secondary | ICD-10-CM

## 2019-09-13 DIAGNOSIS — F0280 Dementia in other diseases classified elsewhere without behavioral disturbance: Secondary | ICD-10-CM

## 2019-09-13 NOTE — Progress Notes (Signed)
   Neuropsychology Feedback Session [name]. [name] Department of Neurology  Reason for Referral:   [name] [name] a 85 y.o. Caucasian male referred by Alonza Bogus, D.O.,to characterize hiscurrent cognitive functioning and assist with diagnostic clarity regarding Parkinson's disease dementia vs. Alzheimer's disease.   Feedback:   [name] [name] completed a comprehensive neuropsychological evaluation on 08/24/2019. Please refer to that encounter for the full report. Briefly, results suggested diffuse cognitive impairment in the severely impaired normative ranges. A relative strength was exhibited across a single numerical-based task assessing basic attention. notable cognitive dysfunction, coupled with report of difficulties completing instrumental ADLs, [name] [name] meets criteria for a major neurocognitive disorder (formerly "dementia") at the present time. Given his history of Parkinson's disease, the most apparent etiology for his cognitive deficits is a Parkinson's disease dementia presentation. However, given his relatively mild Parkinson's disease symptoms, there is a good likelihood that he exhibits a mixed dementia presentation with comorbid Alzheimer's disease. Unfortunately, on learning trials across memory tests, [name] [name] was unable to demonstrate the ability to learn this information, even with repeated exposure. As such, while he exhibited an amnestic memory profile with poor performance across recognition tasks, it is likely that this information was never learned in the first place. Recommendations included consideration of moving to a memory care unit within his assisted living facility, speaking with Dr. Carles Collet surrounding medication adjustments, and discussion surrounding mood-related concerns.  [name] [name] was accompanied by his daughter. Content of the current session focused on the results of the current evaluation and next steps moving forward. Mr.  [name] and his daughter were given the opportunity to ask questions and their questions were answered. They was also encouraged to reach out should additional questions arise. A copy of his report was provided at the conclusion of the visit.      A total of 20 minutes were spent with [name] [name] during the current feedback session.

## 2019-09-19 DIAGNOSIS — R41841 Cognitive communication deficit: Secondary | ICD-10-CM | POA: Diagnosis not present

## 2019-09-28 ENCOUNTER — Other Ambulatory Visit: Payer: Self-pay

## 2019-09-28 ENCOUNTER — Telehealth: Payer: Self-pay | Admitting: Neurology

## 2019-09-28 DIAGNOSIS — R41841 Cognitive communication deficit: Secondary | ICD-10-CM | POA: Diagnosis not present

## 2019-09-28 MED ORDER — DONEPEZIL HCL 5 MG PO TABS: 5.0000 mg | ORAL_TABLET | Freq: Every day | ORAL | 0 refills | Status: DC

## 2019-09-28 NOTE — Telephone Encounter (Signed)
Daughter is calling in about some medication concerns- just started it a month ago she can't remember the name of it. She said it was increased yesterday and he was having violent dreams last night. Wanting to see if there is anything to do with each other . Thanks!

## 2019-09-28 NOTE — Telephone Encounter (Signed)
He was started on aricept.  No, it has nothing to do with the dreaming.  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  He was given klonopin in the past for that and didn't take it.  Should have a bed rail at the very least.

## 2019-09-28 NOTE — Telephone Encounter (Signed)
Left message to call office back

## 2019-09-28 NOTE — Telephone Encounter (Signed)
Spoke with daughter which per DPR in chart is allowed by patient explained what Dr Tat stated and she agreed. No other questions or concerns

## 2019-10-05 DIAGNOSIS — R41841 Cognitive communication deficit: Secondary | ICD-10-CM | POA: Diagnosis not present

## 2019-10-06 DIAGNOSIS — R41841 Cognitive communication deficit: Secondary | ICD-10-CM | POA: Diagnosis not present

## 2019-10-07 DIAGNOSIS — R41841 Cognitive communication deficit: Secondary | ICD-10-CM | POA: Diagnosis not present

## 2019-10-10 DIAGNOSIS — R41841 Cognitive communication deficit: Secondary | ICD-10-CM | POA: Diagnosis not present

## 2019-10-11 ENCOUNTER — Encounter: Payer: Self-pay | Admitting: Neurology

## 2019-10-11 DIAGNOSIS — R41841 Cognitive communication deficit: Secondary | ICD-10-CM | POA: Diagnosis not present

## 2019-10-12 DIAGNOSIS — R41841 Cognitive communication deficit: Secondary | ICD-10-CM | POA: Diagnosis not present

## 2019-10-18 ENCOUNTER — Ambulatory Visit: Payer: Medicare Other | Admitting: Neurology

## 2019-10-18 ENCOUNTER — Other Ambulatory Visit (HOSPITAL_COMMUNITY): Payer: Self-pay | Admitting: Cardiovascular Disease

## 2019-10-18 ENCOUNTER — Other Ambulatory Visit: Payer: Self-pay | Admitting: Neurology

## 2019-11-04 NOTE — Progress Notes (Signed)
Pt scheduled for appt via video.  Daughter forgot about appt so pt r/s and not seen today.

## 2019-11-07 ENCOUNTER — Encounter: Payer: Self-pay | Admitting: Neurology

## 2019-11-07 ENCOUNTER — Other Ambulatory Visit: Payer: Self-pay

## 2019-11-07 ENCOUNTER — Telehealth (INDEPENDENT_AMBULATORY_CARE_PROVIDER_SITE_OTHER): Payer: Medicare Other | Admitting: Neurology

## 2019-11-21 DIAGNOSIS — R41841 Cognitive communication deficit: Secondary | ICD-10-CM | POA: Diagnosis not present

## 2019-11-21 DIAGNOSIS — G20 Parkinson's disease: Secondary | ICD-10-CM | POA: Diagnosis not present

## 2019-11-21 DIAGNOSIS — M6281 Muscle weakness (generalized): Secondary | ICD-10-CM | POA: Diagnosis not present

## 2019-11-21 DIAGNOSIS — R2689 Other abnormalities of gait and mobility: Secondary | ICD-10-CM | POA: Diagnosis not present

## 2019-11-21 DIAGNOSIS — R278 Other lack of coordination: Secondary | ICD-10-CM | POA: Diagnosis not present

## 2019-11-21 DIAGNOSIS — R2681 Unsteadiness on feet: Secondary | ICD-10-CM | POA: Diagnosis not present

## 2019-11-22 DIAGNOSIS — G20 Parkinson's disease: Secondary | ICD-10-CM | POA: Diagnosis not present

## 2019-11-22 DIAGNOSIS — R278 Other lack of coordination: Secondary | ICD-10-CM | POA: Diagnosis not present

## 2019-11-22 DIAGNOSIS — M6281 Muscle weakness (generalized): Secondary | ICD-10-CM | POA: Diagnosis not present

## 2019-11-22 DIAGNOSIS — R2689 Other abnormalities of gait and mobility: Secondary | ICD-10-CM | POA: Diagnosis not present

## 2019-11-22 DIAGNOSIS — R41841 Cognitive communication deficit: Secondary | ICD-10-CM | POA: Diagnosis not present

## 2019-11-22 DIAGNOSIS — R2681 Unsteadiness on feet: Secondary | ICD-10-CM | POA: Diagnosis not present

## 2019-11-23 DIAGNOSIS — R2689 Other abnormalities of gait and mobility: Secondary | ICD-10-CM | POA: Diagnosis not present

## 2019-11-23 DIAGNOSIS — R2681 Unsteadiness on feet: Secondary | ICD-10-CM | POA: Diagnosis not present

## 2019-11-23 DIAGNOSIS — G20 Parkinson's disease: Secondary | ICD-10-CM | POA: Diagnosis not present

## 2019-11-23 DIAGNOSIS — R41841 Cognitive communication deficit: Secondary | ICD-10-CM | POA: Diagnosis not present

## 2019-11-23 DIAGNOSIS — M6281 Muscle weakness (generalized): Secondary | ICD-10-CM | POA: Diagnosis not present

## 2019-11-23 DIAGNOSIS — R278 Other lack of coordination: Secondary | ICD-10-CM | POA: Diagnosis not present

## 2019-11-24 DIAGNOSIS — R2689 Other abnormalities of gait and mobility: Secondary | ICD-10-CM | POA: Diagnosis not present

## 2019-11-24 DIAGNOSIS — G20 Parkinson's disease: Secondary | ICD-10-CM | POA: Diagnosis not present

## 2019-11-24 DIAGNOSIS — R278 Other lack of coordination: Secondary | ICD-10-CM | POA: Diagnosis not present

## 2019-11-24 DIAGNOSIS — R2681 Unsteadiness on feet: Secondary | ICD-10-CM | POA: Diagnosis not present

## 2019-11-24 DIAGNOSIS — M6281 Muscle weakness (generalized): Secondary | ICD-10-CM | POA: Diagnosis not present

## 2019-11-24 DIAGNOSIS — R41841 Cognitive communication deficit: Secondary | ICD-10-CM | POA: Diagnosis not present

## 2019-11-25 DIAGNOSIS — R2681 Unsteadiness on feet: Secondary | ICD-10-CM | POA: Diagnosis not present

## 2019-11-25 DIAGNOSIS — R278 Other lack of coordination: Secondary | ICD-10-CM | POA: Diagnosis not present

## 2019-11-25 DIAGNOSIS — R41841 Cognitive communication deficit: Secondary | ICD-10-CM | POA: Diagnosis not present

## 2019-11-25 DIAGNOSIS — M6281 Muscle weakness (generalized): Secondary | ICD-10-CM | POA: Diagnosis not present

## 2019-11-25 DIAGNOSIS — R2689 Other abnormalities of gait and mobility: Secondary | ICD-10-CM | POA: Diagnosis not present

## 2019-11-25 DIAGNOSIS — G20 Parkinson's disease: Secondary | ICD-10-CM | POA: Diagnosis not present

## 2019-11-28 DIAGNOSIS — R41841 Cognitive communication deficit: Secondary | ICD-10-CM | POA: Diagnosis not present

## 2019-11-28 DIAGNOSIS — R2681 Unsteadiness on feet: Secondary | ICD-10-CM | POA: Diagnosis not present

## 2019-11-28 DIAGNOSIS — R278 Other lack of coordination: Secondary | ICD-10-CM | POA: Diagnosis not present

## 2019-11-28 DIAGNOSIS — M6281 Muscle weakness (generalized): Secondary | ICD-10-CM | POA: Diagnosis not present

## 2019-11-28 DIAGNOSIS — R2689 Other abnormalities of gait and mobility: Secondary | ICD-10-CM | POA: Diagnosis not present

## 2019-11-28 DIAGNOSIS — G20 Parkinson's disease: Secondary | ICD-10-CM | POA: Diagnosis not present

## 2019-11-29 DIAGNOSIS — R278 Other lack of coordination: Secondary | ICD-10-CM | POA: Diagnosis not present

## 2019-11-29 DIAGNOSIS — G20 Parkinson's disease: Secondary | ICD-10-CM | POA: Diagnosis not present

## 2019-11-29 DIAGNOSIS — M6281 Muscle weakness (generalized): Secondary | ICD-10-CM | POA: Diagnosis not present

## 2019-11-29 DIAGNOSIS — R41841 Cognitive communication deficit: Secondary | ICD-10-CM | POA: Diagnosis not present

## 2019-11-29 DIAGNOSIS — R2689 Other abnormalities of gait and mobility: Secondary | ICD-10-CM | POA: Diagnosis not present

## 2019-11-29 DIAGNOSIS — R2681 Unsteadiness on feet: Secondary | ICD-10-CM | POA: Diagnosis not present

## 2019-11-30 DIAGNOSIS — M6281 Muscle weakness (generalized): Secondary | ICD-10-CM | POA: Diagnosis not present

## 2019-11-30 DIAGNOSIS — R41841 Cognitive communication deficit: Secondary | ICD-10-CM | POA: Diagnosis not present

## 2019-11-30 DIAGNOSIS — R2681 Unsteadiness on feet: Secondary | ICD-10-CM | POA: Diagnosis not present

## 2019-11-30 DIAGNOSIS — R278 Other lack of coordination: Secondary | ICD-10-CM | POA: Diagnosis not present

## 2019-11-30 DIAGNOSIS — R2689 Other abnormalities of gait and mobility: Secondary | ICD-10-CM | POA: Diagnosis not present

## 2019-11-30 DIAGNOSIS — G20 Parkinson's disease: Secondary | ICD-10-CM | POA: Diagnosis not present

## 2019-12-01 DIAGNOSIS — R2681 Unsteadiness on feet: Secondary | ICD-10-CM | POA: Diagnosis not present

## 2019-12-01 DIAGNOSIS — M6281 Muscle weakness (generalized): Secondary | ICD-10-CM | POA: Diagnosis not present

## 2019-12-01 DIAGNOSIS — R2689 Other abnormalities of gait and mobility: Secondary | ICD-10-CM | POA: Diagnosis not present

## 2019-12-01 DIAGNOSIS — R41841 Cognitive communication deficit: Secondary | ICD-10-CM | POA: Diagnosis not present

## 2019-12-01 DIAGNOSIS — G20 Parkinson's disease: Secondary | ICD-10-CM | POA: Diagnosis not present

## 2019-12-01 DIAGNOSIS — R278 Other lack of coordination: Secondary | ICD-10-CM | POA: Diagnosis not present

## 2019-12-05 NOTE — Progress Notes (Signed)
Virtual Visit via Video Note The purpose of this virtual visit is to provide medical care while limiting exposure to the novel coronavirus.    Consent was obtained for video visit:  Yes.   Answered questions that patient had about telehealth interaction:  Yes.   I discussed the limitations, risks, security and privacy concerns of performing an evaluation and management service by telemedicine. I also discussed with the patient that there may be a patient responsible charge related to this service. The patient expressed understanding and agreed to proceed.  Pt location: Home Physician Location: office Name of referring provider:  Wenda Low, MD I connected with [name] [name] at patients initiation/request on 12/06/2019 at  1:00 PM EST by video enabled telemedicine application and verified that I am speaking with the correct person using two identifiers. Pt MRN:  [id number] Pt DOB:  June 12, 1929 Video Participants:  [name] [name];  Daughter and wife supplement history   History of Present Illness:  Patient seen today in follow-up for Parkinson's disease and memory change.  He is on carbidopa/levodopa 25/100, 1 tablet 3 times per day.  We started donepezil last visit and the patient is on now 10 mg daily.  No side effects with the medication.  Daughter thinks that it has helped concentration and focus, as does wife and pt.   In regards to hallucinations, patient denies hallucinations but he talks about vivid dreams.   patient also has a history of REM behavior disorder.  Last visit, they stated that he was doing well.  He had a prescription for clonazepam but never took it.  His daughter called me in early November to state that he had a violent dream and wondered if it was from the donepezil, but I reminded her that he had previously had these and it was part of Parkinson's disease.  We discussed making sure that the bedroom was safe, including with possible bed rails.  No falls.  Doing PT  and ST and that has helped a lot. Caregiver comes 5 hours per day, 7 days a week.  Current movement d/o meds:  carbidopa/levodopa 25/100 tid Donepezil, 10 mg daily   Current Outpatient Medications on File Prior to Visit  Medication Sig Dispense Refill  . amLODipine (NORVASC) 10 MG tablet Take 1 tablet (10 mg total) by mouth daily. 90 tablet 3  . aspirin 81 MG tablet Take 81 mg by mouth daily.      . carbidopa-levodopa (SINEMET IR) 25-100 MG tablet Take 1 tablet by mouth three times daily. 270 tablet 1  . clopidogrel (PLAVIX) 75 MG tablet Take 1 tablet by mouth daily. 90 tablet 0  . donepezil (ARICEPT) 10 MG tablet Take 1 tablet (10 mg total) by mouth at bedtime. 90 tablet 1  . donepezil (ARICEPT) 5 MG tablet Take 1 tablet (5 mg total) by mouth at bedtime. 30 tablet 0  . fluticasone (FLONASE) 50 MCG/ACT nasal spray Place 2 sprays into both nostrils daily. --- Office visit needed for further refills 16 g 0  . nitroGLYCERIN (NITROSTAT) 0.4 MG SL tablet Place 1 tablet (0.4 mg total) under the tongue every 5 (five) minutes as needed for chest pain. 25 tablet 3  . pantoprazole (PROTONIX) 20 MG tablet Take 1 tablet by mouth daily.  11   No current facility-administered medications on file prior to visit.     Observations/Objective:   There were no vitals filed for this visit. GEN:  The patient appears stated age and is in  NAD.  Neurological examination:  Orientation: The patient is alert and oriented x3.  Trouble staying focused/on topic Cranial nerves: There is good facial symmetry. There is minimal facial hypomimia.  The speech is fluent and clear. Soft palate rises symmetrically and there is no tongue deviation. Hearing is intact to conversational tone. Motor: Strength is at least antigravity x 4.   Shoulder shrug is equal and symmetric.  There is no pronator drift.  Movement examination: Tone: unable Abnormal movements: none seen Coordination:  There is mild decremation with RAM's,   With hand opening/closing bilaterally Gait and Station: The patient has no difficulty arising out of a deep-seated chair without the use of the hands. The patient's stride length is good.      Assessment and Plan:   1.  Gait instability, in part due to PN             -labs for reversible causes of PN negative, except mild B12 deficiency 2.  Parkinson's disease             -continue carbidopa/levodopa 25/100, 1 po tid. seems to be doing well with this dosage.           3.  B12 deficiency             -Continue supplement.   4.     Dementia, mixed PDD and AD             -Had neurocognitive testing with Dr. Melvyn Novas in October, 2020.  Symptoms were severe.             -Patient on donepezil, 10 mg daily.  Seems to be doing well and has really helped             -no need for additional antipsychotic.  Hallucinations resolved with aricept.    -doing well in the home with caregiving and therapy  -discussed exercises, physical and mental 5. REM behavior disorder             -Patient with vivid dreams but not falling out of the bed.  Safety in BR discussed  Follow Up Instructions:  9 months  -I discussed the assessment and treatment plan with the patient. The patient was provided an opportunity to ask questions and all were answered. The patient agreed with the plan and demonstrated an understanding of the instructions.   The patient was advised to call back or seek an in-person evaluation if the symptoms worsen or if the condition fails to improve as anticipated.    Total time spent on today's visit was 30 minutes, including both face-to-face time and nonface-to-face time.  Time included that spent on review of records (prior notes available to me/labs/imaging if pertinent), discussing treatment and goals, answering patient's questions and coordinating care.   Alonza Bogus, DO

## 2019-12-06 ENCOUNTER — Telehealth (INDEPENDENT_AMBULATORY_CARE_PROVIDER_SITE_OTHER): Payer: Medicare Other | Admitting: Neurology

## 2019-12-06 ENCOUNTER — Encounter: Payer: Self-pay | Admitting: Neurology

## 2019-12-06 ENCOUNTER — Other Ambulatory Visit: Payer: Self-pay

## 2019-12-06 DIAGNOSIS — G20 Parkinson's disease: Secondary | ICD-10-CM

## 2019-12-06 DIAGNOSIS — F0281 Dementia in other diseases classified elsewhere with behavioral disturbance: Secondary | ICD-10-CM | POA: Diagnosis not present

## 2019-12-06 DIAGNOSIS — F02818 Dementia in other diseases classified elsewhere, unspecified severity, with other behavioral disturbance: Secondary | ICD-10-CM

## 2019-12-06 DIAGNOSIS — G4752 REM sleep behavior disorder: Secondary | ICD-10-CM

## 2019-12-07 DIAGNOSIS — R2681 Unsteadiness on feet: Secondary | ICD-10-CM | POA: Diagnosis not present

## 2019-12-07 DIAGNOSIS — G20 Parkinson's disease: Secondary | ICD-10-CM | POA: Diagnosis not present

## 2019-12-07 DIAGNOSIS — R2689 Other abnormalities of gait and mobility: Secondary | ICD-10-CM | POA: Diagnosis not present

## 2019-12-07 DIAGNOSIS — R278 Other lack of coordination: Secondary | ICD-10-CM | POA: Diagnosis not present

## 2019-12-07 DIAGNOSIS — M6281 Muscle weakness (generalized): Secondary | ICD-10-CM | POA: Diagnosis not present

## 2019-12-07 DIAGNOSIS — R41841 Cognitive communication deficit: Secondary | ICD-10-CM | POA: Diagnosis not present

## 2019-12-08 DIAGNOSIS — R2689 Other abnormalities of gait and mobility: Secondary | ICD-10-CM | POA: Diagnosis not present

## 2019-12-08 DIAGNOSIS — M6281 Muscle weakness (generalized): Secondary | ICD-10-CM | POA: Diagnosis not present

## 2019-12-08 DIAGNOSIS — R2681 Unsteadiness on feet: Secondary | ICD-10-CM | POA: Diagnosis not present

## 2019-12-08 DIAGNOSIS — R41841 Cognitive communication deficit: Secondary | ICD-10-CM | POA: Diagnosis not present

## 2019-12-08 DIAGNOSIS — Z23 Encounter for immunization: Secondary | ICD-10-CM | POA: Diagnosis not present

## 2019-12-08 DIAGNOSIS — R278 Other lack of coordination: Secondary | ICD-10-CM | POA: Diagnosis not present

## 2019-12-08 DIAGNOSIS — G20 Parkinson's disease: Secondary | ICD-10-CM | POA: Diagnosis not present

## 2019-12-09 DIAGNOSIS — G20 Parkinson's disease: Secondary | ICD-10-CM | POA: Diagnosis not present

## 2019-12-09 DIAGNOSIS — R278 Other lack of coordination: Secondary | ICD-10-CM | POA: Diagnosis not present

## 2019-12-09 DIAGNOSIS — R2681 Unsteadiness on feet: Secondary | ICD-10-CM | POA: Diagnosis not present

## 2019-12-09 DIAGNOSIS — M6281 Muscle weakness (generalized): Secondary | ICD-10-CM | POA: Diagnosis not present

## 2019-12-09 DIAGNOSIS — R2689 Other abnormalities of gait and mobility: Secondary | ICD-10-CM | POA: Diagnosis not present

## 2019-12-09 DIAGNOSIS — R41841 Cognitive communication deficit: Secondary | ICD-10-CM | POA: Diagnosis not present

## 2019-12-13 ENCOUNTER — Telehealth: Payer: Self-pay | Admitting: Neurology

## 2019-12-13 DIAGNOSIS — G20 Parkinson's disease: Secondary | ICD-10-CM | POA: Diagnosis not present

## 2019-12-13 DIAGNOSIS — R278 Other lack of coordination: Secondary | ICD-10-CM | POA: Diagnosis not present

## 2019-12-13 DIAGNOSIS — M6281 Muscle weakness (generalized): Secondary | ICD-10-CM | POA: Diagnosis not present

## 2019-12-13 DIAGNOSIS — R2689 Other abnormalities of gait and mobility: Secondary | ICD-10-CM | POA: Diagnosis not present

## 2019-12-13 DIAGNOSIS — R2681 Unsteadiness on feet: Secondary | ICD-10-CM | POA: Diagnosis not present

## 2019-12-13 DIAGNOSIS — R41841 Cognitive communication deficit: Secondary | ICD-10-CM | POA: Diagnosis not present

## 2019-12-13 NOTE — Telephone Encounter (Signed)
Merleen Nicely from [facility] is calling in on patient. She is the PT for him. She said he has been having Hypertension -very elevated BP over last few weeks- and patient wasn't aware of who his PCP was- gave her info we have of it being Dr. Lysle Rubens. She was also going to call that office. Wanted to give you an update since PT orders were done through Dr. Carles Collet. Thanks!

## 2019-12-13 NOTE — Telephone Encounter (Signed)
FYI below.

## 2019-12-13 NOTE — Telephone Encounter (Signed)
Thanks.  Agree that they need to call the PCP.  Please make sure that daughter is aware

## 2019-12-16 DIAGNOSIS — G20 Parkinson's disease: Secondary | ICD-10-CM | POA: Diagnosis not present

## 2019-12-16 DIAGNOSIS — M6281 Muscle weakness (generalized): Secondary | ICD-10-CM | POA: Diagnosis not present

## 2019-12-16 DIAGNOSIS — R41841 Cognitive communication deficit: Secondary | ICD-10-CM | POA: Diagnosis not present

## 2019-12-16 DIAGNOSIS — R2689 Other abnormalities of gait and mobility: Secondary | ICD-10-CM | POA: Diagnosis not present

## 2019-12-16 DIAGNOSIS — R278 Other lack of coordination: Secondary | ICD-10-CM | POA: Diagnosis not present

## 2019-12-16 DIAGNOSIS — R2681 Unsteadiness on feet: Secondary | ICD-10-CM | POA: Diagnosis not present

## 2019-12-20 DIAGNOSIS — R2689 Other abnormalities of gait and mobility: Secondary | ICD-10-CM | POA: Diagnosis not present

## 2019-12-20 DIAGNOSIS — R278 Other lack of coordination: Secondary | ICD-10-CM | POA: Diagnosis not present

## 2019-12-20 DIAGNOSIS — G20 Parkinson's disease: Secondary | ICD-10-CM | POA: Diagnosis not present

## 2019-12-20 DIAGNOSIS — M6281 Muscle weakness (generalized): Secondary | ICD-10-CM | POA: Diagnosis not present

## 2019-12-20 DIAGNOSIS — R2681 Unsteadiness on feet: Secondary | ICD-10-CM | POA: Diagnosis not present

## 2019-12-21 DIAGNOSIS — R278 Other lack of coordination: Secondary | ICD-10-CM | POA: Diagnosis not present

## 2019-12-21 DIAGNOSIS — R2681 Unsteadiness on feet: Secondary | ICD-10-CM | POA: Diagnosis not present

## 2019-12-21 DIAGNOSIS — R2689 Other abnormalities of gait and mobility: Secondary | ICD-10-CM | POA: Diagnosis not present

## 2019-12-21 DIAGNOSIS — G20 Parkinson's disease: Secondary | ICD-10-CM | POA: Diagnosis not present

## 2019-12-21 DIAGNOSIS — M6281 Muscle weakness (generalized): Secondary | ICD-10-CM | POA: Diagnosis not present

## 2019-12-22 DIAGNOSIS — R278 Other lack of coordination: Secondary | ICD-10-CM | POA: Diagnosis not present

## 2019-12-22 DIAGNOSIS — G20 Parkinson's disease: Secondary | ICD-10-CM | POA: Diagnosis not present

## 2019-12-22 DIAGNOSIS — R2681 Unsteadiness on feet: Secondary | ICD-10-CM | POA: Diagnosis not present

## 2019-12-22 DIAGNOSIS — R2689 Other abnormalities of gait and mobility: Secondary | ICD-10-CM | POA: Diagnosis not present

## 2019-12-22 DIAGNOSIS — M6281 Muscle weakness (generalized): Secondary | ICD-10-CM | POA: Diagnosis not present

## 2019-12-23 DIAGNOSIS — G20 Parkinson's disease: Secondary | ICD-10-CM | POA: Diagnosis not present

## 2019-12-23 DIAGNOSIS — R278 Other lack of coordination: Secondary | ICD-10-CM | POA: Diagnosis not present

## 2019-12-23 DIAGNOSIS — R2689 Other abnormalities of gait and mobility: Secondary | ICD-10-CM | POA: Diagnosis not present

## 2019-12-23 DIAGNOSIS — M6281 Muscle weakness (generalized): Secondary | ICD-10-CM | POA: Diagnosis not present

## 2019-12-23 DIAGNOSIS — R2681 Unsteadiness on feet: Secondary | ICD-10-CM | POA: Diagnosis not present

## 2019-12-26 DIAGNOSIS — R2689 Other abnormalities of gait and mobility: Secondary | ICD-10-CM | POA: Diagnosis not present

## 2019-12-26 DIAGNOSIS — G20 Parkinson's disease: Secondary | ICD-10-CM | POA: Diagnosis not present

## 2019-12-26 DIAGNOSIS — M6281 Muscle weakness (generalized): Secondary | ICD-10-CM | POA: Diagnosis not present

## 2019-12-26 DIAGNOSIS — R278 Other lack of coordination: Secondary | ICD-10-CM | POA: Diagnosis not present

## 2019-12-26 DIAGNOSIS — R2681 Unsteadiness on feet: Secondary | ICD-10-CM | POA: Diagnosis not present

## 2019-12-27 DIAGNOSIS — G20 Parkinson's disease: Secondary | ICD-10-CM | POA: Diagnosis not present

## 2019-12-27 DIAGNOSIS — M6281 Muscle weakness (generalized): Secondary | ICD-10-CM | POA: Diagnosis not present

## 2019-12-27 DIAGNOSIS — R2681 Unsteadiness on feet: Secondary | ICD-10-CM | POA: Diagnosis not present

## 2019-12-27 DIAGNOSIS — R278 Other lack of coordination: Secondary | ICD-10-CM | POA: Diagnosis not present

## 2019-12-27 DIAGNOSIS — R2689 Other abnormalities of gait and mobility: Secondary | ICD-10-CM | POA: Diagnosis not present

## 2019-12-28 DIAGNOSIS — G20 Parkinson's disease: Secondary | ICD-10-CM | POA: Diagnosis not present

## 2019-12-28 DIAGNOSIS — R2681 Unsteadiness on feet: Secondary | ICD-10-CM | POA: Diagnosis not present

## 2019-12-28 DIAGNOSIS — R2689 Other abnormalities of gait and mobility: Secondary | ICD-10-CM | POA: Diagnosis not present

## 2019-12-28 DIAGNOSIS — M6281 Muscle weakness (generalized): Secondary | ICD-10-CM | POA: Diagnosis not present

## 2019-12-28 DIAGNOSIS — R278 Other lack of coordination: Secondary | ICD-10-CM | POA: Diagnosis not present

## 2020-01-02 DIAGNOSIS — G20 Parkinson's disease: Secondary | ICD-10-CM | POA: Diagnosis not present

## 2020-01-02 DIAGNOSIS — M6281 Muscle weakness (generalized): Secondary | ICD-10-CM | POA: Diagnosis not present

## 2020-01-02 DIAGNOSIS — R2689 Other abnormalities of gait and mobility: Secondary | ICD-10-CM | POA: Diagnosis not present

## 2020-01-02 DIAGNOSIS — R2681 Unsteadiness on feet: Secondary | ICD-10-CM | POA: Diagnosis not present

## 2020-01-02 DIAGNOSIS — R278 Other lack of coordination: Secondary | ICD-10-CM | POA: Diagnosis not present

## 2020-01-05 DIAGNOSIS — Z23 Encounter for immunization: Secondary | ICD-10-CM | POA: Diagnosis not present

## 2020-01-11 DIAGNOSIS — M6281 Muscle weakness (generalized): Secondary | ICD-10-CM | POA: Diagnosis not present

## 2020-01-11 DIAGNOSIS — G20 Parkinson's disease: Secondary | ICD-10-CM | POA: Diagnosis not present

## 2020-01-11 DIAGNOSIS — R2681 Unsteadiness on feet: Secondary | ICD-10-CM | POA: Diagnosis not present

## 2020-01-11 DIAGNOSIS — R278 Other lack of coordination: Secondary | ICD-10-CM | POA: Diagnosis not present

## 2020-01-11 DIAGNOSIS — R2689 Other abnormalities of gait and mobility: Secondary | ICD-10-CM | POA: Diagnosis not present

## 2020-01-16 ENCOUNTER — Other Ambulatory Visit (HOSPITAL_COMMUNITY): Payer: Self-pay | Admitting: Cardiovascular Disease

## 2020-01-17 DIAGNOSIS — R278 Other lack of coordination: Secondary | ICD-10-CM | POA: Diagnosis not present

## 2020-01-17 DIAGNOSIS — M6281 Muscle weakness (generalized): Secondary | ICD-10-CM | POA: Diagnosis not present

## 2020-01-17 DIAGNOSIS — G20 Parkinson's disease: Secondary | ICD-10-CM | POA: Diagnosis not present

## 2020-01-26 DIAGNOSIS — G20 Parkinson's disease: Secondary | ICD-10-CM | POA: Diagnosis not present

## 2020-01-26 DIAGNOSIS — M6281 Muscle weakness (generalized): Secondary | ICD-10-CM | POA: Diagnosis not present

## 2020-01-26 DIAGNOSIS — R278 Other lack of coordination: Secondary | ICD-10-CM | POA: Diagnosis not present

## 2020-02-07 DIAGNOSIS — D239 Other benign neoplasm of skin, unspecified: Secondary | ICD-10-CM | POA: Diagnosis not present

## 2020-02-07 DIAGNOSIS — Z961 Presence of intraocular lens: Secondary | ICD-10-CM | POA: Diagnosis not present

## 2020-02-07 DIAGNOSIS — H469 Unspecified optic neuritis: Secondary | ICD-10-CM | POA: Diagnosis not present

## 2020-02-07 DIAGNOSIS — H35033 Hypertensive retinopathy, bilateral: Secondary | ICD-10-CM | POA: Diagnosis not present

## 2020-02-07 DIAGNOSIS — H353131 Nonexudative age-related macular degeneration, bilateral, early dry stage: Secondary | ICD-10-CM | POA: Diagnosis not present

## 2020-02-21 ENCOUNTER — Other Ambulatory Visit: Payer: Self-pay

## 2020-02-21 MED ORDER — CLOPIDOGREL BISULFATE 75 MG PO TABS: 75.0000 mg | ORAL_TABLET | Freq: Every day | ORAL | 0 refills | Status: DC

## 2020-03-08 ENCOUNTER — Other Ambulatory Visit: Payer: Self-pay | Admitting: Neurology

## 2020-03-08 MED ORDER — DONEPEZIL HCL 10 MG PO TABS: 10.0000 mg | ORAL_TABLET | Freq: Every day | ORAL | 1 refills | Status: DC

## 2020-03-08 NOTE — Telephone Encounter (Signed)
Per Dr Tat patient can have a refill and he can take two of the 5mg  tabs to equal 10 mg. Mickel Baas notified and voiced understanding.  Rx(s) sent to pharmacy electronically.

## 2020-03-08 NOTE — Telephone Encounter (Signed)
Patient's contact called and requested a call back from the nurse. She said the patient is needing refills on donepezil 10 MG.   However, they have another prescription for the same medication at 5 MG. She'd like to know if he can take 2 of them to equal 10 MG of donepezil in the meantime.  [facility] on [address]

## 2020-03-15 ENCOUNTER — Other Ambulatory Visit: Payer: Self-pay

## 2020-04-06 ENCOUNTER — Other Ambulatory Visit: Payer: Self-pay

## 2020-04-06 MED ORDER — DONEPEZIL HCL 10 MG PO TABS: 10.0000 mg | ORAL_TABLET | Freq: Every day | ORAL | 1 refills | Status: DC

## 2020-04-06 NOTE — Telephone Encounter (Signed)
Rx(s) sent to pharmacy electronically.  

## 2020-04-15 ENCOUNTER — Other Ambulatory Visit: Payer: Self-pay | Admitting: Neurology

## 2020-04-17 NOTE — Telephone Encounter (Signed)
Rx(s) sent to pharmacy electronically.  

## 2020-06-05 DIAGNOSIS — R278 Other lack of coordination: Secondary | ICD-10-CM | POA: Diagnosis not present

## 2020-06-05 DIAGNOSIS — G20 Parkinson's disease: Secondary | ICD-10-CM | POA: Diagnosis not present

## 2020-06-05 DIAGNOSIS — R2681 Unsteadiness on feet: Secondary | ICD-10-CM | POA: Diagnosis not present

## 2020-06-05 DIAGNOSIS — R262 Difficulty in walking, not elsewhere classified: Secondary | ICD-10-CM | POA: Diagnosis not present

## 2020-06-05 DIAGNOSIS — R41841 Cognitive communication deficit: Secondary | ICD-10-CM | POA: Diagnosis not present

## 2020-06-06 DIAGNOSIS — R2681 Unsteadiness on feet: Secondary | ICD-10-CM | POA: Diagnosis not present

## 2020-06-06 DIAGNOSIS — R278 Other lack of coordination: Secondary | ICD-10-CM | POA: Diagnosis not present

## 2020-06-06 DIAGNOSIS — G20 Parkinson's disease: Secondary | ICD-10-CM | POA: Diagnosis not present

## 2020-06-06 DIAGNOSIS — R262 Difficulty in walking, not elsewhere classified: Secondary | ICD-10-CM | POA: Diagnosis not present

## 2020-06-06 DIAGNOSIS — R41841 Cognitive communication deficit: Secondary | ICD-10-CM | POA: Diagnosis not present

## 2020-06-07 DIAGNOSIS — G20 Parkinson's disease: Secondary | ICD-10-CM | POA: Diagnosis not present

## 2020-06-07 DIAGNOSIS — R2681 Unsteadiness on feet: Secondary | ICD-10-CM | POA: Diagnosis not present

## 2020-06-07 DIAGNOSIS — R262 Difficulty in walking, not elsewhere classified: Secondary | ICD-10-CM | POA: Diagnosis not present

## 2020-06-07 DIAGNOSIS — R41841 Cognitive communication deficit: Secondary | ICD-10-CM | POA: Diagnosis not present

## 2020-06-07 DIAGNOSIS — R278 Other lack of coordination: Secondary | ICD-10-CM | POA: Diagnosis not present

## 2020-06-08 DIAGNOSIS — G20 Parkinson's disease: Secondary | ICD-10-CM | POA: Diagnosis not present

## 2020-06-08 DIAGNOSIS — R278 Other lack of coordination: Secondary | ICD-10-CM | POA: Diagnosis not present

## 2020-06-08 DIAGNOSIS — R262 Difficulty in walking, not elsewhere classified: Secondary | ICD-10-CM | POA: Diagnosis not present

## 2020-06-08 DIAGNOSIS — R2681 Unsteadiness on feet: Secondary | ICD-10-CM | POA: Diagnosis not present

## 2020-06-08 DIAGNOSIS — R41841 Cognitive communication deficit: Secondary | ICD-10-CM | POA: Diagnosis not present

## 2020-06-11 DIAGNOSIS — R2681 Unsteadiness on feet: Secondary | ICD-10-CM | POA: Diagnosis not present

## 2020-06-11 DIAGNOSIS — R262 Difficulty in walking, not elsewhere classified: Secondary | ICD-10-CM | POA: Diagnosis not present

## 2020-06-11 DIAGNOSIS — R278 Other lack of coordination: Secondary | ICD-10-CM | POA: Diagnosis not present

## 2020-06-11 DIAGNOSIS — G20 Parkinson's disease: Secondary | ICD-10-CM | POA: Diagnosis not present

## 2020-06-11 DIAGNOSIS — R41841 Cognitive communication deficit: Secondary | ICD-10-CM | POA: Diagnosis not present

## 2020-06-13 DIAGNOSIS — R2681 Unsteadiness on feet: Secondary | ICD-10-CM | POA: Diagnosis not present

## 2020-06-13 DIAGNOSIS — R41841 Cognitive communication deficit: Secondary | ICD-10-CM | POA: Diagnosis not present

## 2020-06-13 DIAGNOSIS — R262 Difficulty in walking, not elsewhere classified: Secondary | ICD-10-CM | POA: Diagnosis not present

## 2020-06-13 DIAGNOSIS — R278 Other lack of coordination: Secondary | ICD-10-CM | POA: Diagnosis not present

## 2020-06-13 DIAGNOSIS — G20 Parkinson's disease: Secondary | ICD-10-CM | POA: Diagnosis not present

## 2020-06-14 DIAGNOSIS — R262 Difficulty in walking, not elsewhere classified: Secondary | ICD-10-CM | POA: Diagnosis not present

## 2020-06-14 DIAGNOSIS — G20 Parkinson's disease: Secondary | ICD-10-CM | POA: Diagnosis not present

## 2020-06-14 DIAGNOSIS — R41841 Cognitive communication deficit: Secondary | ICD-10-CM | POA: Diagnosis not present

## 2020-06-14 DIAGNOSIS — R2681 Unsteadiness on feet: Secondary | ICD-10-CM | POA: Diagnosis not present

## 2020-06-14 DIAGNOSIS — R278 Other lack of coordination: Secondary | ICD-10-CM | POA: Diagnosis not present

## 2020-06-15 DIAGNOSIS — R262 Difficulty in walking, not elsewhere classified: Secondary | ICD-10-CM | POA: Diagnosis not present

## 2020-06-15 DIAGNOSIS — R41841 Cognitive communication deficit: Secondary | ICD-10-CM | POA: Diagnosis not present

## 2020-06-15 DIAGNOSIS — G20 Parkinson's disease: Secondary | ICD-10-CM | POA: Diagnosis not present

## 2020-06-15 DIAGNOSIS — R2681 Unsteadiness on feet: Secondary | ICD-10-CM | POA: Diagnosis not present

## 2020-06-15 DIAGNOSIS — R278 Other lack of coordination: Secondary | ICD-10-CM | POA: Diagnosis not present

## 2020-06-18 ENCOUNTER — Other Ambulatory Visit: Payer: Self-pay | Admitting: Internal Medicine

## 2020-06-18 DIAGNOSIS — I2510 Atherosclerotic heart disease of native coronary artery without angina pectoris: Secondary | ICD-10-CM | POA: Diagnosis not present

## 2020-06-18 DIAGNOSIS — L0390 Cellulitis, unspecified: Secondary | ICD-10-CM | POA: Diagnosis not present

## 2020-06-18 DIAGNOSIS — L409 Psoriasis, unspecified: Secondary | ICD-10-CM | POA: Diagnosis not present

## 2020-06-18 DIAGNOSIS — M7989 Other specified soft tissue disorders: Secondary | ICD-10-CM | POA: Diagnosis not present

## 2020-06-19 ENCOUNTER — Ambulatory Visit
Admission: RE | Admit: 2020-06-19 | Discharge: 2020-06-19 | Disposition: A | Discharge status: Home / Self Care | Payer: Medicare Other | Source: Ambulatory Visit | Attending: Internal Medicine | Admitting: Internal Medicine

## 2020-06-19 DIAGNOSIS — G20 Parkinson's disease: Secondary | ICD-10-CM | POA: Diagnosis not present

## 2020-06-19 DIAGNOSIS — R488 Other symbolic dysfunctions: Secondary | ICD-10-CM | POA: Diagnosis not present

## 2020-06-19 DIAGNOSIS — M7989 Other specified soft tissue disorders: Secondary | ICD-10-CM

## 2020-06-19 DIAGNOSIS — R2681 Unsteadiness on feet: Secondary | ICD-10-CM | POA: Diagnosis not present

## 2020-06-19 DIAGNOSIS — R278 Other lack of coordination: Secondary | ICD-10-CM | POA: Diagnosis not present

## 2020-06-19 DIAGNOSIS — R41841 Cognitive communication deficit: Secondary | ICD-10-CM | POA: Diagnosis not present

## 2020-06-19 DIAGNOSIS — R262 Difficulty in walking, not elsewhere classified: Secondary | ICD-10-CM | POA: Diagnosis not present

## 2020-06-20 DIAGNOSIS — R488 Other symbolic dysfunctions: Secondary | ICD-10-CM | POA: Diagnosis not present

## 2020-06-20 DIAGNOSIS — R41841 Cognitive communication deficit: Secondary | ICD-10-CM | POA: Diagnosis not present

## 2020-06-20 DIAGNOSIS — R278 Other lack of coordination: Secondary | ICD-10-CM | POA: Diagnosis not present

## 2020-06-20 DIAGNOSIS — G20 Parkinson's disease: Secondary | ICD-10-CM | POA: Diagnosis not present

## 2020-06-20 DIAGNOSIS — R2681 Unsteadiness on feet: Secondary | ICD-10-CM | POA: Diagnosis not present

## 2020-06-20 DIAGNOSIS — R262 Difficulty in walking, not elsewhere classified: Secondary | ICD-10-CM | POA: Diagnosis not present

## 2020-06-21 DIAGNOSIS — R488 Other symbolic dysfunctions: Secondary | ICD-10-CM | POA: Diagnosis not present

## 2020-06-21 DIAGNOSIS — G20 Parkinson's disease: Secondary | ICD-10-CM | POA: Diagnosis not present

## 2020-06-21 DIAGNOSIS — R2681 Unsteadiness on feet: Secondary | ICD-10-CM | POA: Diagnosis not present

## 2020-06-21 DIAGNOSIS — R262 Difficulty in walking, not elsewhere classified: Secondary | ICD-10-CM | POA: Diagnosis not present

## 2020-06-21 DIAGNOSIS — R278 Other lack of coordination: Secondary | ICD-10-CM | POA: Diagnosis not present

## 2020-06-21 DIAGNOSIS — R41841 Cognitive communication deficit: Secondary | ICD-10-CM | POA: Diagnosis not present

## 2020-06-22 DIAGNOSIS — R21 Rash and other nonspecific skin eruption: Secondary | ICD-10-CM | POA: Diagnosis not present

## 2020-06-22 DIAGNOSIS — R488 Other symbolic dysfunctions: Secondary | ICD-10-CM | POA: Diagnosis not present

## 2020-06-22 DIAGNOSIS — R2681 Unsteadiness on feet: Secondary | ICD-10-CM | POA: Diagnosis not present

## 2020-06-22 DIAGNOSIS — G20 Parkinson's disease: Secondary | ICD-10-CM | POA: Diagnosis not present

## 2020-06-22 DIAGNOSIS — I872 Venous insufficiency (chronic) (peripheral): Secondary | ICD-10-CM | POA: Diagnosis not present

## 2020-06-22 DIAGNOSIS — I2510 Atherosclerotic heart disease of native coronary artery without angina pectoris: Secondary | ICD-10-CM | POA: Diagnosis not present

## 2020-06-22 DIAGNOSIS — R600 Localized edema: Secondary | ICD-10-CM | POA: Diagnosis not present

## 2020-06-22 DIAGNOSIS — R278 Other lack of coordination: Secondary | ICD-10-CM | POA: Diagnosis not present

## 2020-06-22 DIAGNOSIS — R262 Difficulty in walking, not elsewhere classified: Secondary | ICD-10-CM | POA: Diagnosis not present

## 2020-06-22 DIAGNOSIS — R41841 Cognitive communication deficit: Secondary | ICD-10-CM | POA: Diagnosis not present

## 2020-06-26 DIAGNOSIS — R262 Difficulty in walking, not elsewhere classified: Secondary | ICD-10-CM | POA: Diagnosis not present

## 2020-06-26 DIAGNOSIS — R41841 Cognitive communication deficit: Secondary | ICD-10-CM | POA: Diagnosis not present

## 2020-06-26 DIAGNOSIS — R2681 Unsteadiness on feet: Secondary | ICD-10-CM | POA: Diagnosis not present

## 2020-06-26 DIAGNOSIS — R488 Other symbolic dysfunctions: Secondary | ICD-10-CM | POA: Diagnosis not present

## 2020-06-26 DIAGNOSIS — R278 Other lack of coordination: Secondary | ICD-10-CM | POA: Diagnosis not present

## 2020-06-26 DIAGNOSIS — G20 Parkinson's disease: Secondary | ICD-10-CM | POA: Diagnosis not present

## 2020-06-28 DIAGNOSIS — G20 Parkinson's disease: Secondary | ICD-10-CM | POA: Diagnosis not present

## 2020-06-28 DIAGNOSIS — R488 Other symbolic dysfunctions: Secondary | ICD-10-CM | POA: Diagnosis not present

## 2020-06-28 DIAGNOSIS — R2681 Unsteadiness on feet: Secondary | ICD-10-CM | POA: Diagnosis not present

## 2020-06-28 DIAGNOSIS — R41841 Cognitive communication deficit: Secondary | ICD-10-CM | POA: Diagnosis not present

## 2020-06-28 DIAGNOSIS — R278 Other lack of coordination: Secondary | ICD-10-CM | POA: Diagnosis not present

## 2020-06-28 DIAGNOSIS — R262 Difficulty in walking, not elsewhere classified: Secondary | ICD-10-CM | POA: Diagnosis not present

## 2020-06-29 DIAGNOSIS — R2681 Unsteadiness on feet: Secondary | ICD-10-CM | POA: Diagnosis not present

## 2020-06-29 DIAGNOSIS — R278 Other lack of coordination: Secondary | ICD-10-CM | POA: Diagnosis not present

## 2020-06-29 DIAGNOSIS — R41841 Cognitive communication deficit: Secondary | ICD-10-CM | POA: Diagnosis not present

## 2020-06-29 DIAGNOSIS — G20 Parkinson's disease: Secondary | ICD-10-CM | POA: Diagnosis not present

## 2020-06-29 DIAGNOSIS — R488 Other symbolic dysfunctions: Secondary | ICD-10-CM | POA: Diagnosis not present

## 2020-06-29 DIAGNOSIS — R262 Difficulty in walking, not elsewhere classified: Secondary | ICD-10-CM | POA: Diagnosis not present

## 2020-07-03 DIAGNOSIS — R278 Other lack of coordination: Secondary | ICD-10-CM | POA: Diagnosis not present

## 2020-07-03 DIAGNOSIS — R262 Difficulty in walking, not elsewhere classified: Secondary | ICD-10-CM | POA: Diagnosis not present

## 2020-07-03 DIAGNOSIS — R41841 Cognitive communication deficit: Secondary | ICD-10-CM | POA: Diagnosis not present

## 2020-07-03 DIAGNOSIS — R2681 Unsteadiness on feet: Secondary | ICD-10-CM | POA: Diagnosis not present

## 2020-07-03 DIAGNOSIS — G20 Parkinson's disease: Secondary | ICD-10-CM | POA: Diagnosis not present

## 2020-07-03 DIAGNOSIS — R488 Other symbolic dysfunctions: Secondary | ICD-10-CM | POA: Diagnosis not present

## 2020-07-05 DIAGNOSIS — R41841 Cognitive communication deficit: Secondary | ICD-10-CM | POA: Diagnosis not present

## 2020-07-05 DIAGNOSIS — R278 Other lack of coordination: Secondary | ICD-10-CM | POA: Diagnosis not present

## 2020-07-05 DIAGNOSIS — R262 Difficulty in walking, not elsewhere classified: Secondary | ICD-10-CM | POA: Diagnosis not present

## 2020-07-05 DIAGNOSIS — G20 Parkinson's disease: Secondary | ICD-10-CM | POA: Diagnosis not present

## 2020-07-05 DIAGNOSIS — R2681 Unsteadiness on feet: Secondary | ICD-10-CM | POA: Diagnosis not present

## 2020-07-05 DIAGNOSIS — R488 Other symbolic dysfunctions: Secondary | ICD-10-CM | POA: Diagnosis not present

## 2020-07-06 DIAGNOSIS — G20 Parkinson's disease: Secondary | ICD-10-CM | POA: Diagnosis not present

## 2020-07-06 DIAGNOSIS — R488 Other symbolic dysfunctions: Secondary | ICD-10-CM | POA: Diagnosis not present

## 2020-07-06 DIAGNOSIS — R2681 Unsteadiness on feet: Secondary | ICD-10-CM | POA: Diagnosis not present

## 2020-07-06 DIAGNOSIS — R262 Difficulty in walking, not elsewhere classified: Secondary | ICD-10-CM | POA: Diagnosis not present

## 2020-07-06 DIAGNOSIS — R41841 Cognitive communication deficit: Secondary | ICD-10-CM | POA: Diagnosis not present

## 2020-07-06 DIAGNOSIS — R278 Other lack of coordination: Secondary | ICD-10-CM | POA: Diagnosis not present

## 2020-07-09 DIAGNOSIS — R262 Difficulty in walking, not elsewhere classified: Secondary | ICD-10-CM | POA: Diagnosis not present

## 2020-07-09 DIAGNOSIS — G20 Parkinson's disease: Secondary | ICD-10-CM | POA: Diagnosis not present

## 2020-07-09 DIAGNOSIS — R488 Other symbolic dysfunctions: Secondary | ICD-10-CM | POA: Diagnosis not present

## 2020-07-09 DIAGNOSIS — R278 Other lack of coordination: Secondary | ICD-10-CM | POA: Diagnosis not present

## 2020-07-09 DIAGNOSIS — R41841 Cognitive communication deficit: Secondary | ICD-10-CM | POA: Diagnosis not present

## 2020-07-09 DIAGNOSIS — R2681 Unsteadiness on feet: Secondary | ICD-10-CM | POA: Diagnosis not present

## 2020-07-10 DIAGNOSIS — R2681 Unsteadiness on feet: Secondary | ICD-10-CM | POA: Diagnosis not present

## 2020-07-10 DIAGNOSIS — R488 Other symbolic dysfunctions: Secondary | ICD-10-CM | POA: Diagnosis not present

## 2020-07-10 DIAGNOSIS — R262 Difficulty in walking, not elsewhere classified: Secondary | ICD-10-CM | POA: Diagnosis not present

## 2020-07-10 DIAGNOSIS — R41841 Cognitive communication deficit: Secondary | ICD-10-CM | POA: Diagnosis not present

## 2020-07-10 DIAGNOSIS — R278 Other lack of coordination: Secondary | ICD-10-CM | POA: Diagnosis not present

## 2020-07-10 DIAGNOSIS — G20 Parkinson's disease: Secondary | ICD-10-CM | POA: Diagnosis not present

## 2020-07-11 DIAGNOSIS — G20 Parkinson's disease: Secondary | ICD-10-CM | POA: Diagnosis not present

## 2020-07-11 DIAGNOSIS — R488 Other symbolic dysfunctions: Secondary | ICD-10-CM | POA: Diagnosis not present

## 2020-07-11 DIAGNOSIS — R262 Difficulty in walking, not elsewhere classified: Secondary | ICD-10-CM | POA: Diagnosis not present

## 2020-07-11 DIAGNOSIS — R2681 Unsteadiness on feet: Secondary | ICD-10-CM | POA: Diagnosis not present

## 2020-07-11 DIAGNOSIS — R278 Other lack of coordination: Secondary | ICD-10-CM | POA: Diagnosis not present

## 2020-07-11 DIAGNOSIS — R41841 Cognitive communication deficit: Secondary | ICD-10-CM | POA: Diagnosis not present

## 2020-07-12 DIAGNOSIS — R2681 Unsteadiness on feet: Secondary | ICD-10-CM | POA: Diagnosis not present

## 2020-07-12 DIAGNOSIS — G20 Parkinson's disease: Secondary | ICD-10-CM | POA: Diagnosis not present

## 2020-07-12 DIAGNOSIS — R262 Difficulty in walking, not elsewhere classified: Secondary | ICD-10-CM | POA: Diagnosis not present

## 2020-07-12 DIAGNOSIS — R41841 Cognitive communication deficit: Secondary | ICD-10-CM | POA: Diagnosis not present

## 2020-07-12 DIAGNOSIS — R278 Other lack of coordination: Secondary | ICD-10-CM | POA: Diagnosis not present

## 2020-07-12 DIAGNOSIS — R488 Other symbolic dysfunctions: Secondary | ICD-10-CM | POA: Diagnosis not present

## 2020-07-13 DIAGNOSIS — R278 Other lack of coordination: Secondary | ICD-10-CM | POA: Diagnosis not present

## 2020-07-13 DIAGNOSIS — R41841 Cognitive communication deficit: Secondary | ICD-10-CM | POA: Diagnosis not present

## 2020-07-13 DIAGNOSIS — G20 Parkinson's disease: Secondary | ICD-10-CM | POA: Diagnosis not present

## 2020-07-13 DIAGNOSIS — R262 Difficulty in walking, not elsewhere classified: Secondary | ICD-10-CM | POA: Diagnosis not present

## 2020-07-13 DIAGNOSIS — R488 Other symbolic dysfunctions: Secondary | ICD-10-CM | POA: Diagnosis not present

## 2020-07-13 DIAGNOSIS — R2681 Unsteadiness on feet: Secondary | ICD-10-CM | POA: Diagnosis not present

## 2020-07-16 DIAGNOSIS — R278 Other lack of coordination: Secondary | ICD-10-CM | POA: Diagnosis not present

## 2020-07-16 DIAGNOSIS — R488 Other symbolic dysfunctions: Secondary | ICD-10-CM | POA: Diagnosis not present

## 2020-07-16 DIAGNOSIS — R41841 Cognitive communication deficit: Secondary | ICD-10-CM | POA: Diagnosis not present

## 2020-07-16 DIAGNOSIS — G20 Parkinson's disease: Secondary | ICD-10-CM | POA: Diagnosis not present

## 2020-07-16 DIAGNOSIS — R2681 Unsteadiness on feet: Secondary | ICD-10-CM | POA: Diagnosis not present

## 2020-07-16 DIAGNOSIS — R262 Difficulty in walking, not elsewhere classified: Secondary | ICD-10-CM | POA: Diagnosis not present

## 2020-07-17 DIAGNOSIS — R41841 Cognitive communication deficit: Secondary | ICD-10-CM | POA: Diagnosis not present

## 2020-07-17 DIAGNOSIS — G20 Parkinson's disease: Secondary | ICD-10-CM | POA: Diagnosis not present

## 2020-07-17 DIAGNOSIS — R2681 Unsteadiness on feet: Secondary | ICD-10-CM | POA: Diagnosis not present

## 2020-07-17 DIAGNOSIS — R278 Other lack of coordination: Secondary | ICD-10-CM | POA: Diagnosis not present

## 2020-07-17 DIAGNOSIS — R262 Difficulty in walking, not elsewhere classified: Secondary | ICD-10-CM | POA: Diagnosis not present

## 2020-07-17 DIAGNOSIS — R488 Other symbolic dysfunctions: Secondary | ICD-10-CM | POA: Diagnosis not present

## 2020-07-18 DIAGNOSIS — G20 Parkinson's disease: Secondary | ICD-10-CM | POA: Diagnosis not present

## 2020-07-18 DIAGNOSIS — R278 Other lack of coordination: Secondary | ICD-10-CM | POA: Diagnosis not present

## 2020-07-18 DIAGNOSIS — R41841 Cognitive communication deficit: Secondary | ICD-10-CM | POA: Diagnosis not present

## 2020-07-18 DIAGNOSIS — R262 Difficulty in walking, not elsewhere classified: Secondary | ICD-10-CM | POA: Diagnosis not present

## 2020-07-18 DIAGNOSIS — R488 Other symbolic dysfunctions: Secondary | ICD-10-CM | POA: Diagnosis not present

## 2020-07-18 DIAGNOSIS — R2681 Unsteadiness on feet: Secondary | ICD-10-CM | POA: Diagnosis not present

## 2020-07-20 DIAGNOSIS — R262 Difficulty in walking, not elsewhere classified: Secondary | ICD-10-CM | POA: Diagnosis not present

## 2020-07-20 DIAGNOSIS — R488 Other symbolic dysfunctions: Secondary | ICD-10-CM | POA: Diagnosis not present

## 2020-07-20 DIAGNOSIS — G20 Parkinson's disease: Secondary | ICD-10-CM | POA: Diagnosis not present

## 2020-07-20 DIAGNOSIS — R2681 Unsteadiness on feet: Secondary | ICD-10-CM | POA: Diagnosis not present

## 2020-07-20 DIAGNOSIS — R41841 Cognitive communication deficit: Secondary | ICD-10-CM | POA: Diagnosis not present

## 2020-07-20 DIAGNOSIS — R278 Other lack of coordination: Secondary | ICD-10-CM | POA: Diagnosis not present

## 2020-07-24 DIAGNOSIS — R278 Other lack of coordination: Secondary | ICD-10-CM | POA: Diagnosis not present

## 2020-07-24 DIAGNOSIS — R262 Difficulty in walking, not elsewhere classified: Secondary | ICD-10-CM | POA: Diagnosis not present

## 2020-07-24 DIAGNOSIS — R488 Other symbolic dysfunctions: Secondary | ICD-10-CM | POA: Diagnosis not present

## 2020-07-24 DIAGNOSIS — R2681 Unsteadiness on feet: Secondary | ICD-10-CM | POA: Diagnosis not present

## 2020-07-24 DIAGNOSIS — I872 Venous insufficiency (chronic) (peripheral): Secondary | ICD-10-CM | POA: Diagnosis not present

## 2020-07-24 DIAGNOSIS — M79671 Pain in right foot: Secondary | ICD-10-CM | POA: Diagnosis not present

## 2020-07-24 DIAGNOSIS — G20 Parkinson's disease: Secondary | ICD-10-CM | POA: Diagnosis not present

## 2020-07-24 DIAGNOSIS — R41841 Cognitive communication deficit: Secondary | ICD-10-CM | POA: Diagnosis not present

## 2020-07-25 DIAGNOSIS — R2681 Unsteadiness on feet: Secondary | ICD-10-CM | POA: Diagnosis not present

## 2020-07-25 DIAGNOSIS — G20 Parkinson's disease: Secondary | ICD-10-CM | POA: Diagnosis not present

## 2020-07-25 DIAGNOSIS — R41841 Cognitive communication deficit: Secondary | ICD-10-CM | POA: Diagnosis not present

## 2020-07-25 DIAGNOSIS — R262 Difficulty in walking, not elsewhere classified: Secondary | ICD-10-CM | POA: Diagnosis not present

## 2020-07-25 DIAGNOSIS — R278 Other lack of coordination: Secondary | ICD-10-CM | POA: Diagnosis not present

## 2020-07-25 DIAGNOSIS — R488 Other symbolic dysfunctions: Secondary | ICD-10-CM | POA: Diagnosis not present

## 2020-07-26 DIAGNOSIS — R278 Other lack of coordination: Secondary | ICD-10-CM | POA: Diagnosis not present

## 2020-07-26 DIAGNOSIS — G20 Parkinson's disease: Secondary | ICD-10-CM | POA: Diagnosis not present

## 2020-07-26 DIAGNOSIS — R2681 Unsteadiness on feet: Secondary | ICD-10-CM | POA: Diagnosis not present

## 2020-07-26 DIAGNOSIS — R262 Difficulty in walking, not elsewhere classified: Secondary | ICD-10-CM | POA: Diagnosis not present

## 2020-07-26 DIAGNOSIS — R488 Other symbolic dysfunctions: Secondary | ICD-10-CM | POA: Diagnosis not present

## 2020-07-26 DIAGNOSIS — R41841 Cognitive communication deficit: Secondary | ICD-10-CM | POA: Diagnosis not present

## 2020-07-27 DIAGNOSIS — R2681 Unsteadiness on feet: Secondary | ICD-10-CM | POA: Diagnosis not present

## 2020-07-27 DIAGNOSIS — R278 Other lack of coordination: Secondary | ICD-10-CM | POA: Diagnosis not present

## 2020-07-27 DIAGNOSIS — R41841 Cognitive communication deficit: Secondary | ICD-10-CM | POA: Diagnosis not present

## 2020-07-27 DIAGNOSIS — R488 Other symbolic dysfunctions: Secondary | ICD-10-CM | POA: Diagnosis not present

## 2020-07-27 DIAGNOSIS — R262 Difficulty in walking, not elsewhere classified: Secondary | ICD-10-CM | POA: Diagnosis not present

## 2020-07-27 DIAGNOSIS — G20 Parkinson's disease: Secondary | ICD-10-CM | POA: Diagnosis not present

## 2020-07-30 DIAGNOSIS — R278 Other lack of coordination: Secondary | ICD-10-CM | POA: Diagnosis not present

## 2020-07-30 DIAGNOSIS — G20 Parkinson's disease: Secondary | ICD-10-CM | POA: Diagnosis not present

## 2020-07-30 DIAGNOSIS — R41841 Cognitive communication deficit: Secondary | ICD-10-CM | POA: Diagnosis not present

## 2020-07-30 DIAGNOSIS — R488 Other symbolic dysfunctions: Secondary | ICD-10-CM | POA: Diagnosis not present

## 2020-07-30 DIAGNOSIS — R2681 Unsteadiness on feet: Secondary | ICD-10-CM | POA: Diagnosis not present

## 2020-07-30 DIAGNOSIS — R262 Difficulty in walking, not elsewhere classified: Secondary | ICD-10-CM | POA: Diagnosis not present

## 2020-07-31 DIAGNOSIS — H6123 Impacted cerumen, bilateral: Secondary | ICD-10-CM | POA: Diagnosis not present

## 2020-07-31 DIAGNOSIS — R2681 Unsteadiness on feet: Secondary | ICD-10-CM | POA: Diagnosis not present

## 2020-07-31 DIAGNOSIS — R41841 Cognitive communication deficit: Secondary | ICD-10-CM | POA: Diagnosis not present

## 2020-07-31 DIAGNOSIS — R488 Other symbolic dysfunctions: Secondary | ICD-10-CM | POA: Diagnosis not present

## 2020-07-31 DIAGNOSIS — G20 Parkinson's disease: Secondary | ICD-10-CM | POA: Diagnosis not present

## 2020-07-31 DIAGNOSIS — R278 Other lack of coordination: Secondary | ICD-10-CM | POA: Diagnosis not present

## 2020-07-31 DIAGNOSIS — R262 Difficulty in walking, not elsewhere classified: Secondary | ICD-10-CM | POA: Diagnosis not present

## 2020-08-01 DIAGNOSIS — G20 Parkinson's disease: Secondary | ICD-10-CM | POA: Diagnosis not present

## 2020-08-01 DIAGNOSIS — R488 Other symbolic dysfunctions: Secondary | ICD-10-CM | POA: Diagnosis not present

## 2020-08-01 DIAGNOSIS — R2681 Unsteadiness on feet: Secondary | ICD-10-CM | POA: Diagnosis not present

## 2020-08-01 DIAGNOSIS — R278 Other lack of coordination: Secondary | ICD-10-CM | POA: Diagnosis not present

## 2020-08-01 DIAGNOSIS — R41841 Cognitive communication deficit: Secondary | ICD-10-CM | POA: Diagnosis not present

## 2020-08-01 DIAGNOSIS — R262 Difficulty in walking, not elsewhere classified: Secondary | ICD-10-CM | POA: Diagnosis not present

## 2020-08-02 DIAGNOSIS — R488 Other symbolic dysfunctions: Secondary | ICD-10-CM | POA: Diagnosis not present

## 2020-08-02 DIAGNOSIS — R278 Other lack of coordination: Secondary | ICD-10-CM | POA: Diagnosis not present

## 2020-08-02 DIAGNOSIS — G20 Parkinson's disease: Secondary | ICD-10-CM | POA: Diagnosis not present

## 2020-08-02 DIAGNOSIS — R2681 Unsteadiness on feet: Secondary | ICD-10-CM | POA: Diagnosis not present

## 2020-08-02 DIAGNOSIS — R262 Difficulty in walking, not elsewhere classified: Secondary | ICD-10-CM | POA: Diagnosis not present

## 2020-08-02 DIAGNOSIS — R41841 Cognitive communication deficit: Secondary | ICD-10-CM | POA: Diagnosis not present

## 2020-08-03 DIAGNOSIS — R262 Difficulty in walking, not elsewhere classified: Secondary | ICD-10-CM | POA: Diagnosis not present

## 2020-08-03 DIAGNOSIS — R278 Other lack of coordination: Secondary | ICD-10-CM | POA: Diagnosis not present

## 2020-08-03 DIAGNOSIS — R41841 Cognitive communication deficit: Secondary | ICD-10-CM | POA: Diagnosis not present

## 2020-08-03 DIAGNOSIS — R2681 Unsteadiness on feet: Secondary | ICD-10-CM | POA: Diagnosis not present

## 2020-08-03 DIAGNOSIS — G20 Parkinson's disease: Secondary | ICD-10-CM | POA: Diagnosis not present

## 2020-08-03 DIAGNOSIS — R488 Other symbolic dysfunctions: Secondary | ICD-10-CM | POA: Diagnosis not present

## 2020-08-06 DIAGNOSIS — R278 Other lack of coordination: Secondary | ICD-10-CM | POA: Diagnosis not present

## 2020-08-06 DIAGNOSIS — R41841 Cognitive communication deficit: Secondary | ICD-10-CM | POA: Diagnosis not present

## 2020-08-06 DIAGNOSIS — R488 Other symbolic dysfunctions: Secondary | ICD-10-CM | POA: Diagnosis not present

## 2020-08-06 DIAGNOSIS — R2681 Unsteadiness on feet: Secondary | ICD-10-CM | POA: Diagnosis not present

## 2020-08-06 DIAGNOSIS — R262 Difficulty in walking, not elsewhere classified: Secondary | ICD-10-CM | POA: Diagnosis not present

## 2020-08-06 DIAGNOSIS — G20 Parkinson's disease: Secondary | ICD-10-CM | POA: Diagnosis not present

## 2020-08-07 DIAGNOSIS — L821 Other seborrheic keratosis: Secondary | ICD-10-CM | POA: Diagnosis not present

## 2020-08-07 DIAGNOSIS — Z85828 Personal history of other malignant neoplasm of skin: Secondary | ICD-10-CM | POA: Diagnosis not present

## 2020-08-07 DIAGNOSIS — G20 Parkinson's disease: Secondary | ICD-10-CM | POA: Diagnosis not present

## 2020-08-07 DIAGNOSIS — C44329 Squamous cell carcinoma of skin of other parts of face: Secondary | ICD-10-CM | POA: Diagnosis not present

## 2020-08-07 DIAGNOSIS — R2681 Unsteadiness on feet: Secondary | ICD-10-CM | POA: Diagnosis not present

## 2020-08-07 DIAGNOSIS — R278 Other lack of coordination: Secondary | ICD-10-CM | POA: Diagnosis not present

## 2020-08-07 DIAGNOSIS — R262 Difficulty in walking, not elsewhere classified: Secondary | ICD-10-CM | POA: Diagnosis not present

## 2020-08-07 DIAGNOSIS — L820 Inflamed seborrheic keratosis: Secondary | ICD-10-CM | POA: Diagnosis not present

## 2020-08-07 DIAGNOSIS — R41841 Cognitive communication deficit: Secondary | ICD-10-CM | POA: Diagnosis not present

## 2020-08-07 DIAGNOSIS — R488 Other symbolic dysfunctions: Secondary | ICD-10-CM | POA: Diagnosis not present

## 2020-08-07 DIAGNOSIS — D485 Neoplasm of uncertain behavior of skin: Secondary | ICD-10-CM | POA: Diagnosis not present

## 2020-08-08 DIAGNOSIS — R2681 Unsteadiness on feet: Secondary | ICD-10-CM | POA: Diagnosis not present

## 2020-08-08 DIAGNOSIS — G20 Parkinson's disease: Secondary | ICD-10-CM | POA: Diagnosis not present

## 2020-08-08 DIAGNOSIS — R278 Other lack of coordination: Secondary | ICD-10-CM | POA: Diagnosis not present

## 2020-08-08 DIAGNOSIS — R41841 Cognitive communication deficit: Secondary | ICD-10-CM | POA: Diagnosis not present

## 2020-08-08 DIAGNOSIS — R488 Other symbolic dysfunctions: Secondary | ICD-10-CM | POA: Diagnosis not present

## 2020-08-08 DIAGNOSIS — R262 Difficulty in walking, not elsewhere classified: Secondary | ICD-10-CM | POA: Diagnosis not present

## 2020-08-09 DIAGNOSIS — R2681 Unsteadiness on feet: Secondary | ICD-10-CM | POA: Diagnosis not present

## 2020-08-09 DIAGNOSIS — R278 Other lack of coordination: Secondary | ICD-10-CM | POA: Diagnosis not present

## 2020-08-09 DIAGNOSIS — R41841 Cognitive communication deficit: Secondary | ICD-10-CM | POA: Diagnosis not present

## 2020-08-09 DIAGNOSIS — R262 Difficulty in walking, not elsewhere classified: Secondary | ICD-10-CM | POA: Diagnosis not present

## 2020-08-09 DIAGNOSIS — G20 Parkinson's disease: Secondary | ICD-10-CM | POA: Diagnosis not present

## 2020-08-09 DIAGNOSIS — R488 Other symbolic dysfunctions: Secondary | ICD-10-CM | POA: Diagnosis not present

## 2020-08-10 DIAGNOSIS — R488 Other symbolic dysfunctions: Secondary | ICD-10-CM | POA: Diagnosis not present

## 2020-08-10 DIAGNOSIS — G20 Parkinson's disease: Secondary | ICD-10-CM | POA: Diagnosis not present

## 2020-08-10 DIAGNOSIS — R2681 Unsteadiness on feet: Secondary | ICD-10-CM | POA: Diagnosis not present

## 2020-08-10 DIAGNOSIS — R262 Difficulty in walking, not elsewhere classified: Secondary | ICD-10-CM | POA: Diagnosis not present

## 2020-08-10 DIAGNOSIS — R41841 Cognitive communication deficit: Secondary | ICD-10-CM | POA: Diagnosis not present

## 2020-08-10 DIAGNOSIS — R278 Other lack of coordination: Secondary | ICD-10-CM | POA: Diagnosis not present

## 2020-08-13 DIAGNOSIS — R41841 Cognitive communication deficit: Secondary | ICD-10-CM | POA: Diagnosis not present

## 2020-08-13 DIAGNOSIS — G20 Parkinson's disease: Secondary | ICD-10-CM | POA: Diagnosis not present

## 2020-08-13 DIAGNOSIS — R488 Other symbolic dysfunctions: Secondary | ICD-10-CM | POA: Diagnosis not present

## 2020-08-13 DIAGNOSIS — R2681 Unsteadiness on feet: Secondary | ICD-10-CM | POA: Diagnosis not present

## 2020-08-13 DIAGNOSIS — R278 Other lack of coordination: Secondary | ICD-10-CM | POA: Diagnosis not present

## 2020-08-13 DIAGNOSIS — R262 Difficulty in walking, not elsewhere classified: Secondary | ICD-10-CM | POA: Diagnosis not present

## 2020-08-14 DIAGNOSIS — R488 Other symbolic dysfunctions: Secondary | ICD-10-CM | POA: Diagnosis not present

## 2020-08-14 DIAGNOSIS — R278 Other lack of coordination: Secondary | ICD-10-CM | POA: Diagnosis not present

## 2020-08-14 DIAGNOSIS — R41841 Cognitive communication deficit: Secondary | ICD-10-CM | POA: Diagnosis not present

## 2020-08-14 DIAGNOSIS — R262 Difficulty in walking, not elsewhere classified: Secondary | ICD-10-CM | POA: Diagnosis not present

## 2020-08-14 DIAGNOSIS — G20 Parkinson's disease: Secondary | ICD-10-CM | POA: Diagnosis not present

## 2020-08-14 DIAGNOSIS — R2681 Unsteadiness on feet: Secondary | ICD-10-CM | POA: Diagnosis not present

## 2020-08-15 DIAGNOSIS — R262 Difficulty in walking, not elsewhere classified: Secondary | ICD-10-CM | POA: Diagnosis not present

## 2020-08-15 DIAGNOSIS — R488 Other symbolic dysfunctions: Secondary | ICD-10-CM | POA: Diagnosis not present

## 2020-08-15 DIAGNOSIS — R41841 Cognitive communication deficit: Secondary | ICD-10-CM | POA: Diagnosis not present

## 2020-08-15 DIAGNOSIS — G20 Parkinson's disease: Secondary | ICD-10-CM | POA: Diagnosis not present

## 2020-08-15 DIAGNOSIS — R278 Other lack of coordination: Secondary | ICD-10-CM | POA: Diagnosis not present

## 2020-08-15 DIAGNOSIS — R2681 Unsteadiness on feet: Secondary | ICD-10-CM | POA: Diagnosis not present

## 2020-08-16 DIAGNOSIS — R278 Other lack of coordination: Secondary | ICD-10-CM | POA: Diagnosis not present

## 2020-08-16 DIAGNOSIS — R2681 Unsteadiness on feet: Secondary | ICD-10-CM | POA: Diagnosis not present

## 2020-08-16 DIAGNOSIS — R262 Difficulty in walking, not elsewhere classified: Secondary | ICD-10-CM | POA: Diagnosis not present

## 2020-08-16 DIAGNOSIS — G20 Parkinson's disease: Secondary | ICD-10-CM | POA: Diagnosis not present

## 2020-08-16 DIAGNOSIS — R488 Other symbolic dysfunctions: Secondary | ICD-10-CM | POA: Diagnosis not present

## 2020-08-16 DIAGNOSIS — R41841 Cognitive communication deficit: Secondary | ICD-10-CM | POA: Diagnosis not present

## 2020-08-17 DIAGNOSIS — R278 Other lack of coordination: Secondary | ICD-10-CM | POA: Diagnosis not present

## 2020-08-17 DIAGNOSIS — R488 Other symbolic dysfunctions: Secondary | ICD-10-CM | POA: Diagnosis not present

## 2020-08-17 DIAGNOSIS — G20 Parkinson's disease: Secondary | ICD-10-CM | POA: Diagnosis not present

## 2020-08-17 DIAGNOSIS — R2681 Unsteadiness on feet: Secondary | ICD-10-CM | POA: Diagnosis not present

## 2020-08-17 DIAGNOSIS — R41841 Cognitive communication deficit: Secondary | ICD-10-CM | POA: Diagnosis not present

## 2020-08-17 DIAGNOSIS — R262 Difficulty in walking, not elsewhere classified: Secondary | ICD-10-CM | POA: Diagnosis not present

## 2020-08-20 DIAGNOSIS — R488 Other symbolic dysfunctions: Secondary | ICD-10-CM | POA: Diagnosis not present

## 2020-08-20 DIAGNOSIS — R278 Other lack of coordination: Secondary | ICD-10-CM | POA: Diagnosis not present

## 2020-08-20 DIAGNOSIS — R262 Difficulty in walking, not elsewhere classified: Secondary | ICD-10-CM | POA: Diagnosis not present

## 2020-08-20 DIAGNOSIS — G20 Parkinson's disease: Secondary | ICD-10-CM | POA: Diagnosis not present

## 2020-08-20 DIAGNOSIS — R41841 Cognitive communication deficit: Secondary | ICD-10-CM | POA: Diagnosis not present

## 2020-08-20 DIAGNOSIS — R2681 Unsteadiness on feet: Secondary | ICD-10-CM | POA: Diagnosis not present

## 2020-08-21 DIAGNOSIS — R262 Difficulty in walking, not elsewhere classified: Secondary | ICD-10-CM | POA: Diagnosis not present

## 2020-08-21 DIAGNOSIS — R278 Other lack of coordination: Secondary | ICD-10-CM | POA: Diagnosis not present

## 2020-08-21 DIAGNOSIS — G20 Parkinson's disease: Secondary | ICD-10-CM | POA: Diagnosis not present

## 2020-08-21 DIAGNOSIS — R2681 Unsteadiness on feet: Secondary | ICD-10-CM | POA: Diagnosis not present

## 2020-08-21 DIAGNOSIS — R41841 Cognitive communication deficit: Secondary | ICD-10-CM | POA: Diagnosis not present

## 2020-08-21 DIAGNOSIS — R488 Other symbolic dysfunctions: Secondary | ICD-10-CM | POA: Diagnosis not present

## 2020-08-22 DIAGNOSIS — R2681 Unsteadiness on feet: Secondary | ICD-10-CM | POA: Diagnosis not present

## 2020-08-22 DIAGNOSIS — R278 Other lack of coordination: Secondary | ICD-10-CM | POA: Diagnosis not present

## 2020-08-22 DIAGNOSIS — R262 Difficulty in walking, not elsewhere classified: Secondary | ICD-10-CM | POA: Diagnosis not present

## 2020-08-22 DIAGNOSIS — R41841 Cognitive communication deficit: Secondary | ICD-10-CM | POA: Diagnosis not present

## 2020-08-22 DIAGNOSIS — G20 Parkinson's disease: Secondary | ICD-10-CM | POA: Diagnosis not present

## 2020-08-22 DIAGNOSIS — R488 Other symbolic dysfunctions: Secondary | ICD-10-CM | POA: Diagnosis not present

## 2020-08-23 DIAGNOSIS — R41841 Cognitive communication deficit: Secondary | ICD-10-CM | POA: Diagnosis not present

## 2020-08-23 DIAGNOSIS — G20 Parkinson's disease: Secondary | ICD-10-CM | POA: Diagnosis not present

## 2020-08-23 DIAGNOSIS — R2681 Unsteadiness on feet: Secondary | ICD-10-CM | POA: Diagnosis not present

## 2020-08-23 DIAGNOSIS — R488 Other symbolic dysfunctions: Secondary | ICD-10-CM | POA: Diagnosis not present

## 2020-08-23 DIAGNOSIS — R278 Other lack of coordination: Secondary | ICD-10-CM | POA: Diagnosis not present

## 2020-08-23 DIAGNOSIS — R262 Difficulty in walking, not elsewhere classified: Secondary | ICD-10-CM | POA: Diagnosis not present

## 2020-08-28 DIAGNOSIS — R262 Difficulty in walking, not elsewhere classified: Secondary | ICD-10-CM | POA: Diagnosis not present

## 2020-08-28 DIAGNOSIS — R278 Other lack of coordination: Secondary | ICD-10-CM | POA: Diagnosis not present

## 2020-08-28 DIAGNOSIS — G20 Parkinson's disease: Secondary | ICD-10-CM | POA: Diagnosis not present

## 2020-08-28 DIAGNOSIS — R488 Other symbolic dysfunctions: Secondary | ICD-10-CM | POA: Diagnosis not present

## 2020-08-28 DIAGNOSIS — R41841 Cognitive communication deficit: Secondary | ICD-10-CM | POA: Diagnosis not present

## 2020-08-28 DIAGNOSIS — R2681 Unsteadiness on feet: Secondary | ICD-10-CM | POA: Diagnosis not present

## 2020-08-28 NOTE — Progress Notes (Deleted)
Assessment/Plan:   1.  Parkinsons Disease  -***Continue carbidopa/levodopa 25/100, 1 tablet 3 times per day  2. B12 deficiency  -On oral supplement  3. Dementia, next PDD and AD  -Neurocognitive testing with Dr. Melvyn Novas in October, 2020 demonstrated severe symptoms.  -Patient on donepezil, 10 mg daily which did seem to resolve hallucinations.  4.  RBD  -Patient has vivid dreams, but no unsafe behavior.   Subjective:   [name] [name] was seen today in follow up for Parkinsons disease.  My previous records were reviewed prior to todays visit as well as outside records available to me. Pt denies falls.  Pt denies lightheadedness, near syncope.  No hallucinations.  Mood has been good.  Current prescribed movement disorder medications: ***Carbidopa/levodopa 25/100, 1 tablet 3 times per day Donepezil, 10 mg daily   PREVIOUS MEDICATIONS: {Parkinson's RX:18200}  ALLERGIES:   Allergies  Allergen Reactions   Hydromorphone Hcl      Dilaudid Rxed for renal calculi caused angioedema    Phenytoin Swelling   Zocor [Simvastatin - High Dose]     Elevated hepatic enzymes   Crestor [Rosuvastatin Calcium] Other (See Comments)    Leg pain. Not as bad since on a lower dose   Hydromorphone Hcl      Dilaudid Rxed for renal calculi caused angioedema   Statins     Leg pain    CURRENT MEDICATIONS:  Outpatient Encounter Medications as of 09/04/2020  Medication Sig   amLODipine (NORVASC) 10 MG tablet Take 1 tablet (10 mg total) by mouth daily.   aspirin 81 MG tablet Take 81 mg by mouth daily.     carbidopa-levodopa (SINEMET IR) 25-100 MG tablet Take 1 tablet by mouth three times daily.   clopidogrel (PLAVIX) 75 MG tablet Take 1 tablet (75 mg total) by mouth daily. Please make overdue appt with Dr. Burt Knack before anymore refills. 2nd attempt   donepezil (ARICEPT) 10 MG tablet Take 1 tablet (10 mg total) by mouth at bedtime.   fluticasone (FLONASE) 50 MCG/ACT nasal spray Place 2  sprays into both nostrils daily. --- Office visit needed for further refills   nitroGLYCERIN (NITROSTAT) 0.4 MG SL tablet Place 1 tablet (0.4 mg total) under the tongue every 5 (five) minutes as needed for chest pain.   pantoprazole (PROTONIX) 20 MG tablet Take 1 tablet by mouth daily.   No facility-administered encounter medications on file as of 09/04/2020.    Objective:   PHYSICAL EXAMINATION:    VITALS:  There were no vitals filed for this visit.  GEN:  The patient appears stated age and is in NAD. HEENT:  Normocephalic, atraumatic.  The mucous membranes are moist. The superficial temporal arteries are without ropiness or tenderness. CV:  RRR Lungs:  CTAB Neck/HEME:  There are no carotid bruits bilaterally.  Neurological examination:  Orientation: The patient is alert and oriented x3. Cranial nerves: There is good facial symmetry with*** facial hypomimia. The speech is fluent and clear. Soft palate rises symmetrically and there is no tongue deviation. Hearing is intact to conversational tone. Sensation: Sensation is intact to light touch throughout Motor: [name] is at least antigravity x4.  Movement examination: Tone: There is ***tone in the *** Abnormal movements: *** Coordination:  There is *** decremation with RAM's, *** Gait and Station: The patient has *** difficulty arising out of a deep-seated chair without the use of the hands. The patient's stride length is ***.  The patient has a *** pull test.  I have reviewed and interpreted the following labs independently    Chemistry      Component Value Date/Time   NA 142 08/14/2019 1425   K 3.9 08/14/2019 1425   CL 108 08/14/2019 1425   CO2 23 08/14/2019 1417   BUN 22 08/14/2019 1425   CREATININE 0.90 08/14/2019 1425   CREATININE 1.01 01/27/2019 1405      Component Value Date/Time   CALCIUM 9.5 08/14/2019 1417   ALKPHOS 139 (H) 08/14/2019 1417   AST 22 08/14/2019 1417   ALT 15 08/14/2019 1417   BILITOT 0.6  08/14/2019 1417       Lab Results  Component Value Date   WBC 8.7 08/14/2019   HGB 13.3 08/14/2019   HCT 39.0 08/14/2019   MCV 98.5 08/14/2019   PLT 198 08/14/2019    Lab Results  Component Value Date   TSH 2.74 01/27/2019     Total time spent on today's visit was ***30 minutes, including both face-to-face time and nonface-to-face time.  Time included that spent on review of records (prior notes available to me/labs/imaging if pertinent), discussing treatment and goals, answering patient's questions and coordinating care.  Cc:  Wenda Low, MD

## 2020-08-30 DIAGNOSIS — R278 Other lack of coordination: Secondary | ICD-10-CM | POA: Diagnosis not present

## 2020-08-30 DIAGNOSIS — R41841 Cognitive communication deficit: Secondary | ICD-10-CM | POA: Diagnosis not present

## 2020-08-30 DIAGNOSIS — R2681 Unsteadiness on feet: Secondary | ICD-10-CM | POA: Diagnosis not present

## 2020-08-30 DIAGNOSIS — R488 Other symbolic dysfunctions: Secondary | ICD-10-CM | POA: Diagnosis not present

## 2020-08-30 DIAGNOSIS — G20 Parkinson's disease: Secondary | ICD-10-CM | POA: Diagnosis not present

## 2020-08-30 DIAGNOSIS — R262 Difficulty in walking, not elsewhere classified: Secondary | ICD-10-CM | POA: Diagnosis not present

## 2020-08-31 DIAGNOSIS — R2681 Unsteadiness on feet: Secondary | ICD-10-CM | POA: Diagnosis not present

## 2020-08-31 DIAGNOSIS — R41841 Cognitive communication deficit: Secondary | ICD-10-CM | POA: Diagnosis not present

## 2020-08-31 DIAGNOSIS — R262 Difficulty in walking, not elsewhere classified: Secondary | ICD-10-CM | POA: Diagnosis not present

## 2020-08-31 DIAGNOSIS — G20 Parkinson's disease: Secondary | ICD-10-CM | POA: Diagnosis not present

## 2020-08-31 DIAGNOSIS — R488 Other symbolic dysfunctions: Secondary | ICD-10-CM | POA: Diagnosis not present

## 2020-08-31 DIAGNOSIS — R278 Other lack of coordination: Secondary | ICD-10-CM | POA: Diagnosis not present

## 2020-09-04 ENCOUNTER — Ambulatory Visit: Payer: Medicare Other | Admitting: Neurology

## 2020-09-04 DIAGNOSIS — C44329 Squamous cell carcinoma of skin of other parts of face: Secondary | ICD-10-CM | POA: Diagnosis not present

## 2020-09-04 DIAGNOSIS — R2681 Unsteadiness on feet: Secondary | ICD-10-CM | POA: Diagnosis not present

## 2020-09-04 DIAGNOSIS — G20 Parkinson's disease: Secondary | ICD-10-CM | POA: Diagnosis not present

## 2020-09-04 DIAGNOSIS — R41841 Cognitive communication deficit: Secondary | ICD-10-CM | POA: Diagnosis not present

## 2020-09-04 DIAGNOSIS — R262 Difficulty in walking, not elsewhere classified: Secondary | ICD-10-CM | POA: Diagnosis not present

## 2020-09-04 DIAGNOSIS — R488 Other symbolic dysfunctions: Secondary | ICD-10-CM | POA: Diagnosis not present

## 2020-09-04 DIAGNOSIS — Z85828 Personal history of other malignant neoplasm of skin: Secondary | ICD-10-CM | POA: Diagnosis not present

## 2020-09-04 DIAGNOSIS — R278 Other lack of coordination: Secondary | ICD-10-CM | POA: Diagnosis not present

## 2020-09-05 DIAGNOSIS — R2681 Unsteadiness on feet: Secondary | ICD-10-CM | POA: Diagnosis not present

## 2020-09-05 DIAGNOSIS — R278 Other lack of coordination: Secondary | ICD-10-CM | POA: Diagnosis not present

## 2020-09-05 DIAGNOSIS — R488 Other symbolic dysfunctions: Secondary | ICD-10-CM | POA: Diagnosis not present

## 2020-09-05 DIAGNOSIS — G20 Parkinson's disease: Secondary | ICD-10-CM | POA: Diagnosis not present

## 2020-09-05 DIAGNOSIS — R262 Difficulty in walking, not elsewhere classified: Secondary | ICD-10-CM | POA: Diagnosis not present

## 2020-09-05 DIAGNOSIS — R41841 Cognitive communication deficit: Secondary | ICD-10-CM | POA: Diagnosis not present

## 2020-09-06 DIAGNOSIS — G20 Parkinson's disease: Secondary | ICD-10-CM | POA: Diagnosis not present

## 2020-09-06 DIAGNOSIS — R2681 Unsteadiness on feet: Secondary | ICD-10-CM | POA: Diagnosis not present

## 2020-09-06 DIAGNOSIS — R262 Difficulty in walking, not elsewhere classified: Secondary | ICD-10-CM | POA: Diagnosis not present

## 2020-09-06 DIAGNOSIS — R278 Other lack of coordination: Secondary | ICD-10-CM | POA: Diagnosis not present

## 2020-09-06 DIAGNOSIS — R488 Other symbolic dysfunctions: Secondary | ICD-10-CM | POA: Diagnosis not present

## 2020-09-06 DIAGNOSIS — R41841 Cognitive communication deficit: Secondary | ICD-10-CM | POA: Diagnosis not present

## 2020-09-07 DIAGNOSIS — R488 Other symbolic dysfunctions: Secondary | ICD-10-CM | POA: Diagnosis not present

## 2020-09-07 DIAGNOSIS — G20 Parkinson's disease: Secondary | ICD-10-CM | POA: Diagnosis not present

## 2020-09-07 DIAGNOSIS — R41841 Cognitive communication deficit: Secondary | ICD-10-CM | POA: Diagnosis not present

## 2020-09-07 DIAGNOSIS — R2681 Unsteadiness on feet: Secondary | ICD-10-CM | POA: Diagnosis not present

## 2020-09-07 DIAGNOSIS — R262 Difficulty in walking, not elsewhere classified: Secondary | ICD-10-CM | POA: Diagnosis not present

## 2020-09-07 DIAGNOSIS — R278 Other lack of coordination: Secondary | ICD-10-CM | POA: Diagnosis not present

## 2020-09-10 DIAGNOSIS — L409 Psoriasis, unspecified: Secondary | ICD-10-CM | POA: Diagnosis not present

## 2020-09-10 DIAGNOSIS — R2681 Unsteadiness on feet: Secondary | ICD-10-CM | POA: Diagnosis not present

## 2020-09-10 DIAGNOSIS — R488 Other symbolic dysfunctions: Secondary | ICD-10-CM | POA: Diagnosis not present

## 2020-09-10 DIAGNOSIS — R278 Other lack of coordination: Secondary | ICD-10-CM | POA: Diagnosis not present

## 2020-09-10 DIAGNOSIS — M79671 Pain in right foot: Secondary | ICD-10-CM | POA: Diagnosis not present

## 2020-09-10 DIAGNOSIS — R600 Localized edema: Secondary | ICD-10-CM | POA: Diagnosis not present

## 2020-09-10 DIAGNOSIS — R262 Difficulty in walking, not elsewhere classified: Secondary | ICD-10-CM | POA: Diagnosis not present

## 2020-09-10 DIAGNOSIS — G20 Parkinson's disease: Secondary | ICD-10-CM | POA: Diagnosis not present

## 2020-09-10 DIAGNOSIS — R41841 Cognitive communication deficit: Secondary | ICD-10-CM | POA: Diagnosis not present

## 2020-09-10 IMAGING — CT CT HEAD CODE STROKE
4 series · 16 of 47 positions shown, 18 images · non-contrast
Comparison: None.

CLINICAL DATA: Code stroke.  Right facial droop.  Rule out stroke

EXAM:
CT HEAD WITHOUT CONTRAST
TECHNIQUE: Contiguous axial images were obtained from the base of the skull
through the vertex without intravenous contrast.

[Series 3: head without · axial · non-contrast · 0.45mm/px · z∈[+1060,+1180]mm · 7 of 34 slices shown, 9 images]
[im 5/34  brain]
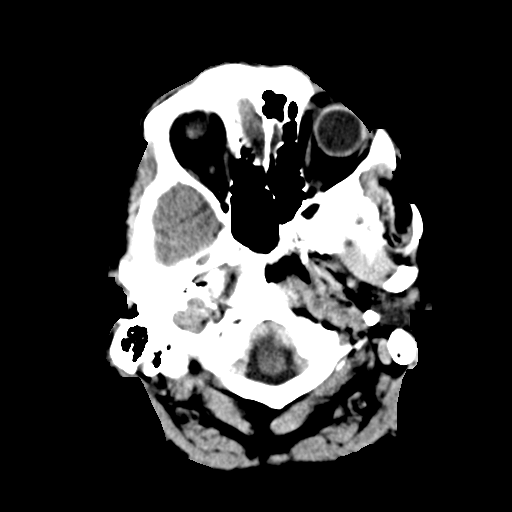
[im 5/34  bone]
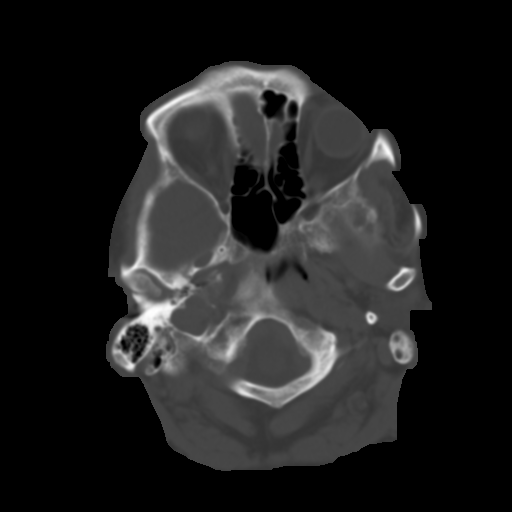
[im 9/34  brain]
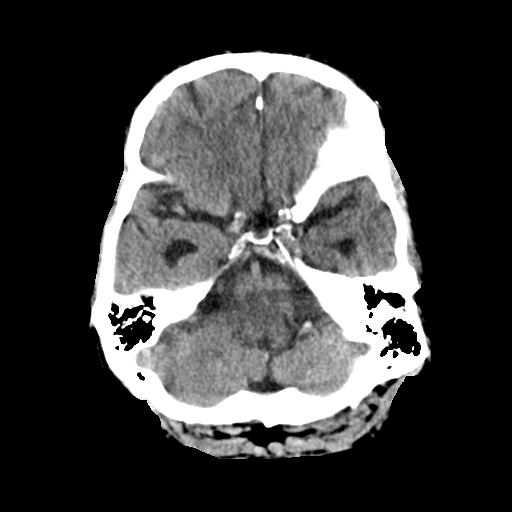
[im 13/34  brain]
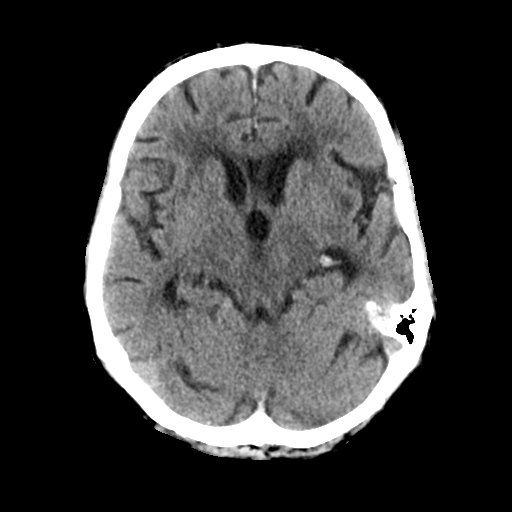
[im 17/34  brain]
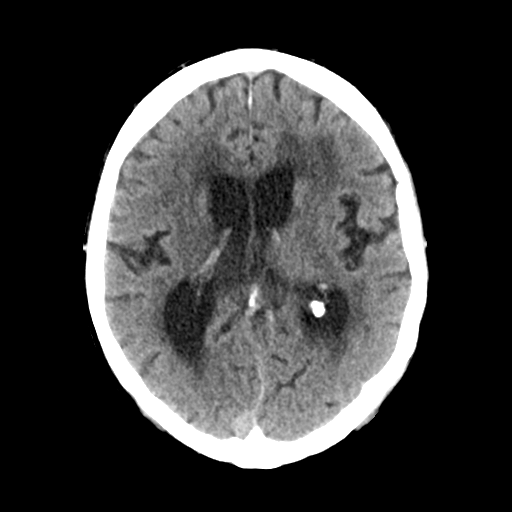
[im 21/34  brain]
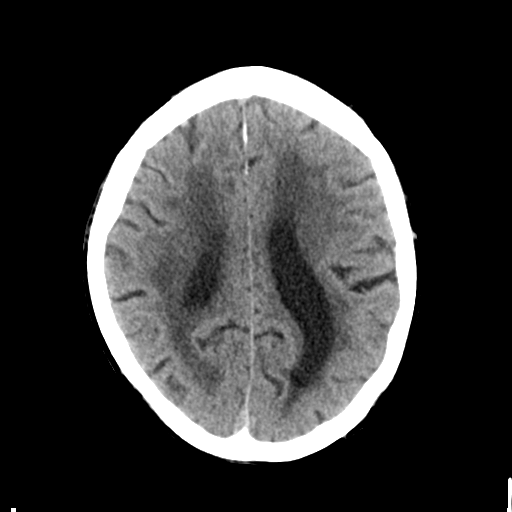
[im 21/34  bone]
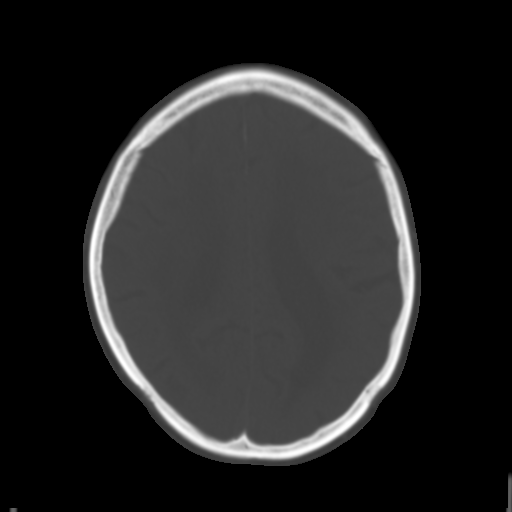
[im 25/34  brain]
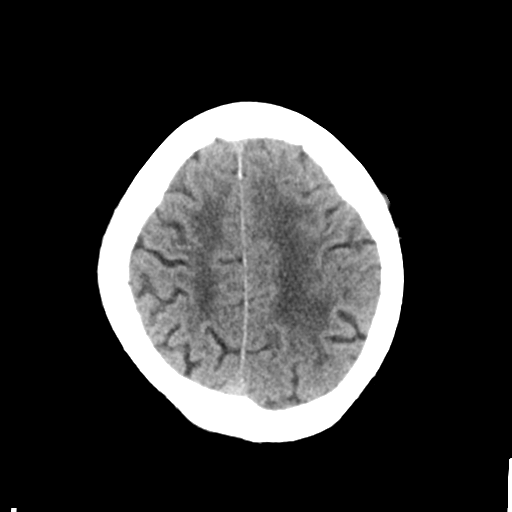
[im 29/34  brain]
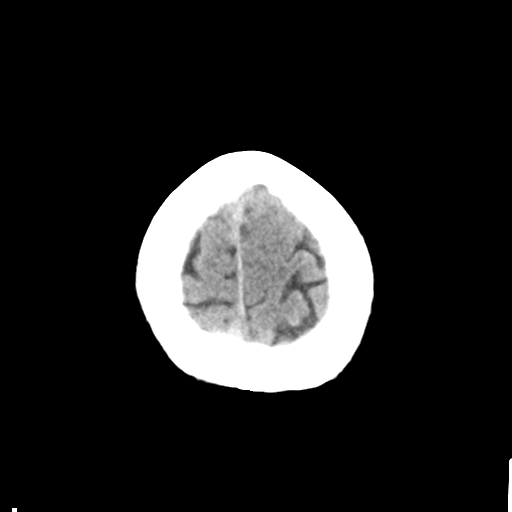

[Series 4: head bone · axial · 0.45mm/px · z∈[+1056,+1088]mm · 3 of 84 slices shown]
[im 9/84  bone]
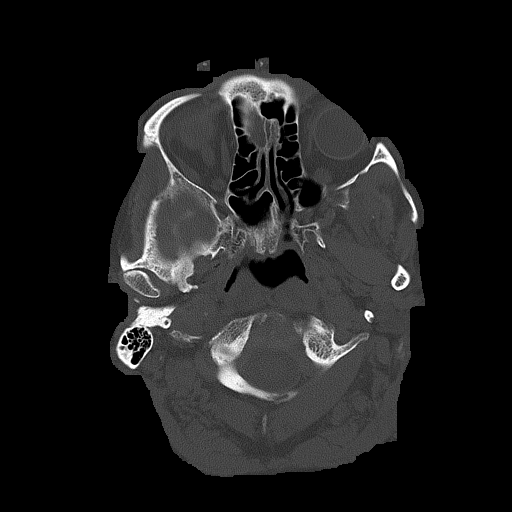
[im 17/84  bone]
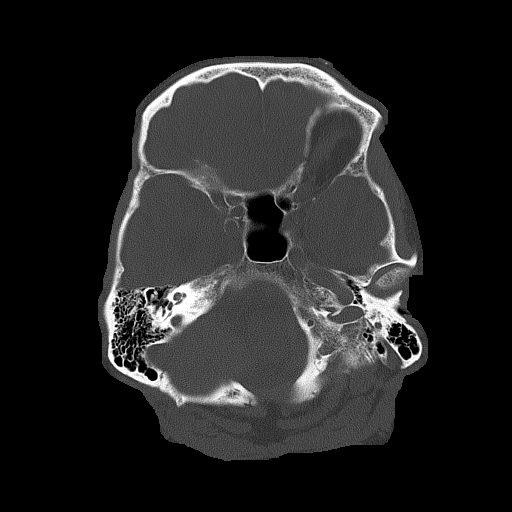
[im 25/84  bone]
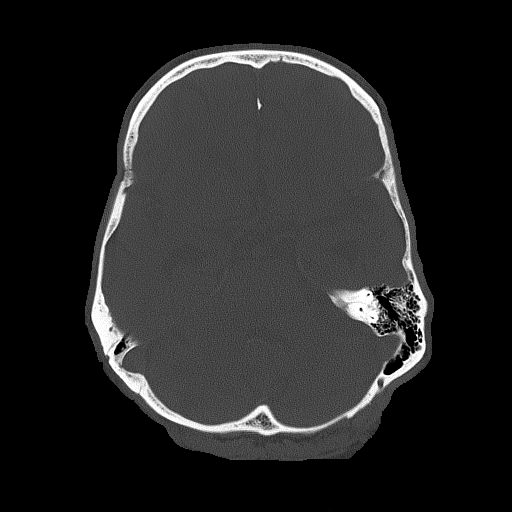

[Series 5: head without cor · coronal · non-contrast · 0.39mm/px · 3 of 70 slices shown]
[im 28/70  brain]
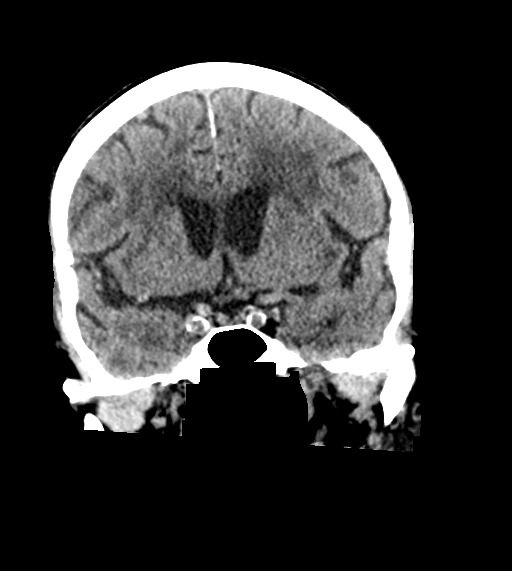
[im 33/70  brain]
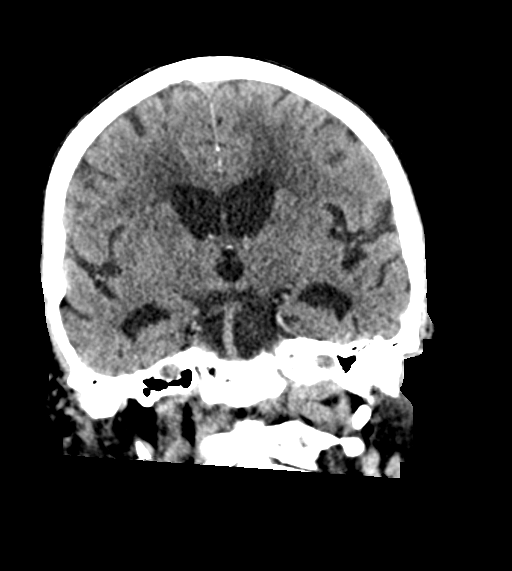
[im 38/70  brain]
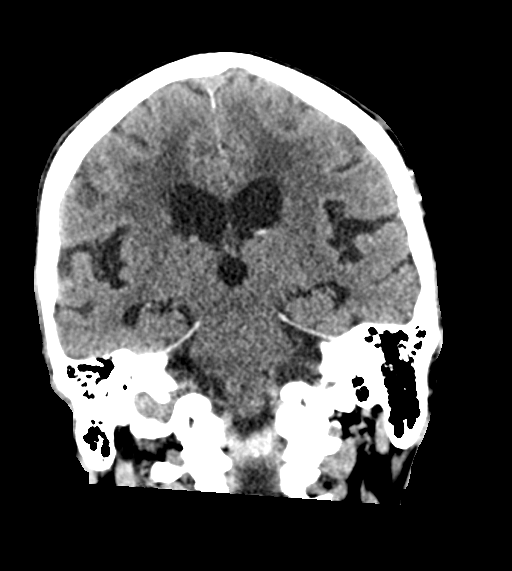

[Series 6: head without sag · sagittal · non-contrast · 0.35mm/px · 3 of 55 slices shown]
[im 19/55  brain]
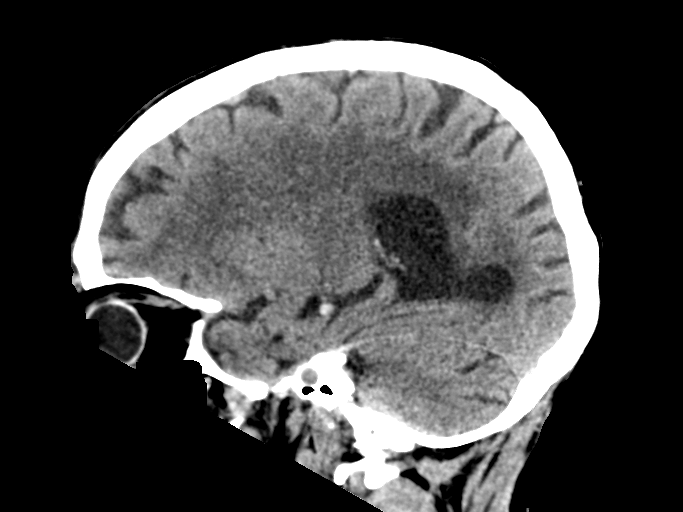
[im 28/55  brain]
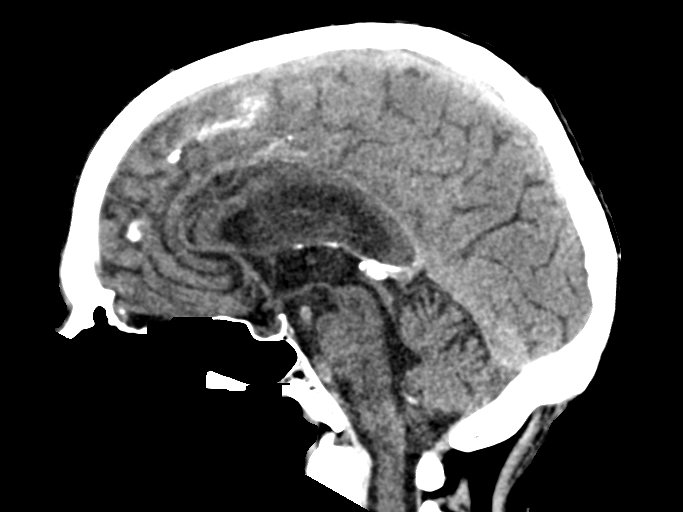
[im 37/55  brain]
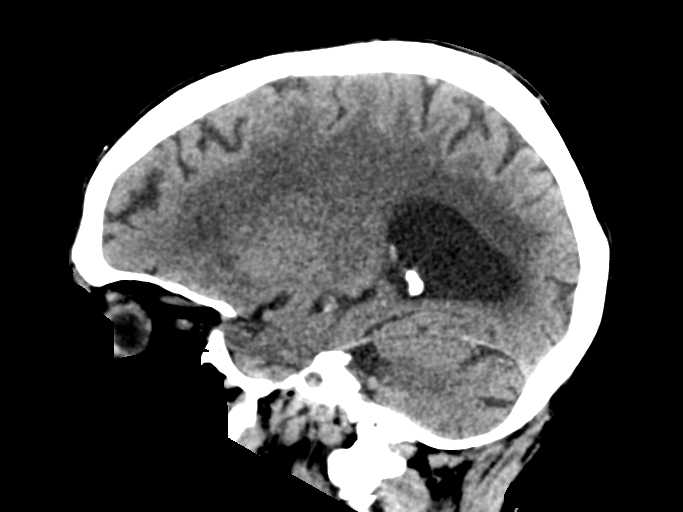

[16 of 47 positions shown; findings below may reference images not displayed]

FINDINGS: Brain: Moderate cerebral atrophy. Ventricular enlargement consistent
with atrophy.

Diffuse bilateral white matter disease is symmetric and likely
chronic. No acute cortical infarct. Negative for acute hemorrhage or
mass.

Vascular: Negative for hyperdense vessel. Atherosclerotic
calcification in the cavernous carotid bilaterally.

Skull: Negative

Sinuses/Orbits: Negative

Other: None

ASPECTS (Alberta Stroke Program Early CT Score)

- Ganglionic level infarction (caudate, lentiform nuclei, internal
capsule, insula, M1-M3 cortex): 7

- Supraganglionic infarction (M4-M6 cortex): 3

Total score (0-10 with 10 being normal): 10
IMPRESSION: 1. No acute intracranial abnormality. Moderate atrophy and moderate
chronic microvascular ischemic change.
2. ASPECTS is 10
3. Preliminary results texted to Dr. Sonkar

## 2020-09-11 DIAGNOSIS — R278 Other lack of coordination: Secondary | ICD-10-CM | POA: Diagnosis not present

## 2020-09-11 DIAGNOSIS — R41841 Cognitive communication deficit: Secondary | ICD-10-CM | POA: Diagnosis not present

## 2020-09-11 DIAGNOSIS — R2681 Unsteadiness on feet: Secondary | ICD-10-CM | POA: Diagnosis not present

## 2020-09-11 DIAGNOSIS — R488 Other symbolic dysfunctions: Secondary | ICD-10-CM | POA: Diagnosis not present

## 2020-09-11 DIAGNOSIS — G20 Parkinson's disease: Secondary | ICD-10-CM | POA: Diagnosis not present

## 2020-09-11 DIAGNOSIS — R262 Difficulty in walking, not elsewhere classified: Secondary | ICD-10-CM | POA: Diagnosis not present

## 2020-09-12 DIAGNOSIS — R488 Other symbolic dysfunctions: Secondary | ICD-10-CM | POA: Diagnosis not present

## 2020-09-12 DIAGNOSIS — G20 Parkinson's disease: Secondary | ICD-10-CM | POA: Diagnosis not present

## 2020-09-12 DIAGNOSIS — R262 Difficulty in walking, not elsewhere classified: Secondary | ICD-10-CM | POA: Diagnosis not present

## 2020-09-12 DIAGNOSIS — R2681 Unsteadiness on feet: Secondary | ICD-10-CM | POA: Diagnosis not present

## 2020-09-12 DIAGNOSIS — R41841 Cognitive communication deficit: Secondary | ICD-10-CM | POA: Diagnosis not present

## 2020-09-12 DIAGNOSIS — R278 Other lack of coordination: Secondary | ICD-10-CM | POA: Diagnosis not present

## 2020-09-13 DIAGNOSIS — R278 Other lack of coordination: Secondary | ICD-10-CM | POA: Diagnosis not present

## 2020-09-13 DIAGNOSIS — R262 Difficulty in walking, not elsewhere classified: Secondary | ICD-10-CM | POA: Diagnosis not present

## 2020-09-13 DIAGNOSIS — R41841 Cognitive communication deficit: Secondary | ICD-10-CM | POA: Diagnosis not present

## 2020-09-13 DIAGNOSIS — R488 Other symbolic dysfunctions: Secondary | ICD-10-CM | POA: Diagnosis not present

## 2020-09-13 DIAGNOSIS — R2681 Unsteadiness on feet: Secondary | ICD-10-CM | POA: Diagnosis not present

## 2020-09-13 DIAGNOSIS — G20 Parkinson's disease: Secondary | ICD-10-CM | POA: Diagnosis not present

## 2020-09-18 DIAGNOSIS — R488 Other symbolic dysfunctions: Secondary | ICD-10-CM | POA: Diagnosis not present

## 2020-09-18 DIAGNOSIS — R278 Other lack of coordination: Secondary | ICD-10-CM | POA: Diagnosis not present

## 2020-09-18 DIAGNOSIS — R41841 Cognitive communication deficit: Secondary | ICD-10-CM | POA: Diagnosis not present

## 2020-09-19 DIAGNOSIS — R41841 Cognitive communication deficit: Secondary | ICD-10-CM | POA: Diagnosis not present

## 2020-09-19 DIAGNOSIS — R278 Other lack of coordination: Secondary | ICD-10-CM | POA: Diagnosis not present

## 2020-09-19 DIAGNOSIS — R488 Other symbolic dysfunctions: Secondary | ICD-10-CM | POA: Diagnosis not present

## 2020-09-25 DIAGNOSIS — R488 Other symbolic dysfunctions: Secondary | ICD-10-CM | POA: Diagnosis not present

## 2020-09-25 DIAGNOSIS — R278 Other lack of coordination: Secondary | ICD-10-CM | POA: Diagnosis not present

## 2020-09-25 DIAGNOSIS — R41841 Cognitive communication deficit: Secondary | ICD-10-CM | POA: Diagnosis not present

## 2020-09-26 ENCOUNTER — Other Ambulatory Visit: Payer: Self-pay

## 2020-09-26 ENCOUNTER — Ambulatory Visit (INDEPENDENT_AMBULATORY_CARE_PROVIDER_SITE_OTHER): Payer: Medicare Other | Admitting: Podiatry

## 2020-09-26 ENCOUNTER — Ambulatory Visit (INDEPENDENT_AMBULATORY_CARE_PROVIDER_SITE_OTHER): Payer: Medicare Other

## 2020-09-26 DIAGNOSIS — R278 Other lack of coordination: Secondary | ICD-10-CM | POA: Diagnosis not present

## 2020-09-26 DIAGNOSIS — M7751 Other enthesopathy of right foot: Secondary | ICD-10-CM

## 2020-09-26 DIAGNOSIS — R41841 Cognitive communication deficit: Secondary | ICD-10-CM | POA: Diagnosis not present

## 2020-09-26 DIAGNOSIS — R488 Other symbolic dysfunctions: Secondary | ICD-10-CM | POA: Diagnosis not present

## 2020-09-26 NOTE — Progress Notes (Signed)
   HPI: 86 y.o. male presenting today as a new patient with his wife for evaluation of right great toe pain.  Pain is been present for approximately 1 month now.  Patient denies a history of injury.  He states that it has been painful however it is slowly improved slightly over the past month.  He has not done anything for treatment.  Past Medical History:  Diagnosis Date  . Asthma   . Bladder cancer ([place])   . CAD (coronary artery disease)    a. s/p Cypher DES x 2 to prox LAD and Cypher DES x 1 to distal LAD 08/2009;  b. cath with residual D2 50% 10/05 and normal EF;  c. Myoview 6/11: EF 60%, mild fixed INF, inferoseptal, inferoapical thinning, no ischemia, low risk, EF 60%  . Colon polyps   . Essential hypertension, benign 06/16/2012  . GERD (gastroesophageal reflux disease)   . HLD (hyperlipidemia)    h/o elevated LFTs on Zocor  . Nephrolithiasis   . Other abnormal glucose 2012   FBS 104  . Parkinson's disease ([place]) 02/16/2018  . Skin cancer, basal cell    Dr Sherrye Payor     Physical Exam: General: The patient is alert and oriented x3 in no acute distress.  Dermatology: Skin is warm, dry and supple bilateral lower extremities. Negative for open lesions or macerations.  Vascular: Palpable pedal pulses bilaterally. No edema or erythema noted. Capillary refill within normal limits.  Neurological: Epicritic and protective threshold grossly intact bilaterally.   Musculoskeletal Exam: Range of motion within normal limits to all pedal and ankle joints bilateral. Muscle strength 5/5 in all groups bilateral.  There is some pain on palpation range of motion the first MTPJ of the right foot  Radiographic Exam:   There does appear to be an old fracture of the base of the proximal phalanx that slightly intra-articular.  This does not appear to be acute in nature.  Diffuse osteoporosis noted throughout the foot given the patient's age  Assessment: 1.  First MTPJ capsulitis right   Plan of Care:    1. Patient evaluated. X-Rays reviewed.  2.  Injection of 0.5 cc Celestone Soluspan injected in the right first MTPJ 3.  Recommend good supportive shoes 4.  Return to clinic as needed      Edrick Kins, DPM [facility]  Dr. Edrick Kins, DPM    [address], [place] [address]                Office [phone]  Fax [phone]

## 2020-10-03 DIAGNOSIS — R41841 Cognitive communication deficit: Secondary | ICD-10-CM | POA: Diagnosis not present

## 2020-10-03 DIAGNOSIS — R278 Other lack of coordination: Secondary | ICD-10-CM | POA: Diagnosis not present

## 2020-10-03 DIAGNOSIS — R488 Other symbolic dysfunctions: Secondary | ICD-10-CM | POA: Diagnosis not present

## 2020-10-10 DIAGNOSIS — R488 Other symbolic dysfunctions: Secondary | ICD-10-CM | POA: Diagnosis not present

## 2020-10-10 DIAGNOSIS — R41841 Cognitive communication deficit: Secondary | ICD-10-CM | POA: Diagnosis not present

## 2020-10-10 DIAGNOSIS — R278 Other lack of coordination: Secondary | ICD-10-CM | POA: Diagnosis not present

## 2020-10-12 ENCOUNTER — Other Ambulatory Visit: Payer: Self-pay | Admitting: Neurology

## 2020-10-17 DIAGNOSIS — R278 Other lack of coordination: Secondary | ICD-10-CM | POA: Diagnosis not present

## 2020-10-17 DIAGNOSIS — R488 Other symbolic dysfunctions: Secondary | ICD-10-CM | POA: Diagnosis not present

## 2020-10-31 ENCOUNTER — Emergency Department (HOSPITAL_COMMUNITY)
Admission: EM | Admit: 2020-10-31 | Discharge: 2020-10-31 | Disposition: A | Discharge status: Home / Self Care | Payer: Medicare Other | Attending: Emergency Medicine | Admitting: Emergency Medicine

## 2020-10-31 ENCOUNTER — Encounter (HOSPITAL_COMMUNITY): Payer: Self-pay | Admitting: Emergency Medicine

## 2020-10-31 ENCOUNTER — Other Ambulatory Visit: Payer: Self-pay

## 2020-10-31 ENCOUNTER — Emergency Department (HOSPITAL_COMMUNITY): Payer: Medicare Other

## 2020-10-31 DIAGNOSIS — R4182 Altered mental status, unspecified: Secondary | ICD-10-CM | POA: Diagnosis not present

## 2020-10-31 DIAGNOSIS — Z8551 Personal history of malignant neoplasm of bladder: Secondary | ICD-10-CM | POA: Diagnosis not present

## 2020-10-31 DIAGNOSIS — G20 Parkinson's disease: Secondary | ICD-10-CM | POA: Diagnosis not present

## 2020-10-31 DIAGNOSIS — R410 Disorientation, unspecified: Secondary | ICD-10-CM | POA: Diagnosis not present

## 2020-10-31 DIAGNOSIS — J45909 Unspecified asthma, uncomplicated: Secondary | ICD-10-CM | POA: Insufficient documentation

## 2020-10-31 DIAGNOSIS — R059 Cough, unspecified: Secondary | ICD-10-CM | POA: Diagnosis not present

## 2020-10-31 DIAGNOSIS — G9389 Other specified disorders of brain: Secondary | ICD-10-CM | POA: Diagnosis not present

## 2020-10-31 DIAGNOSIS — Z7901 Long term (current) use of anticoagulants: Secondary | ICD-10-CM | POA: Diagnosis not present

## 2020-10-31 DIAGNOSIS — I10 Essential (primary) hypertension: Secondary | ICD-10-CM | POA: Insufficient documentation

## 2020-10-31 DIAGNOSIS — I959 Hypotension, unspecified: Secondary | ICD-10-CM | POA: Diagnosis not present

## 2020-10-31 DIAGNOSIS — Z79899 Other long term (current) drug therapy: Secondary | ICD-10-CM | POA: Insufficient documentation

## 2020-10-31 DIAGNOSIS — Z87891 Personal history of nicotine dependence: Secondary | ICD-10-CM | POA: Diagnosis not present

## 2020-10-31 DIAGNOSIS — G319 Degenerative disease of nervous system, unspecified: Secondary | ICD-10-CM | POA: Diagnosis not present

## 2020-10-31 DIAGNOSIS — Z7982 Long term (current) use of aspirin: Secondary | ICD-10-CM | POA: Diagnosis not present

## 2020-10-31 DIAGNOSIS — I2510 Atherosclerotic heart disease of native coronary artery without angina pectoris: Secondary | ICD-10-CM | POA: Diagnosis not present

## 2020-10-31 DIAGNOSIS — R404 Transient alteration of awareness: Secondary | ICD-10-CM | POA: Diagnosis not present

## 2020-10-31 DIAGNOSIS — R001 Bradycardia, unspecified: Secondary | ICD-10-CM | POA: Diagnosis not present

## 2020-10-31 DIAGNOSIS — I6782 Cerebral ischemia: Secondary | ICD-10-CM | POA: Diagnosis not present

## 2020-10-31 LAB — COMPREHENSIVE METABOLIC PANEL
ALT: 22 U/L (ref 0–44)
AST: 16 U/L (ref 15–41)
Albumin: 3.4 g/dL — ABNORMAL LOW (ref 3.5–5.0)
Alkaline Phosphatase: 109 U/L (ref 38–126)
Anion gap: 10 (ref 5–15)
BUN: 18 mg/dL (ref 8–23)
CO2: 19 mmol/L — ABNORMAL LOW (ref 22–32)
Calcium: 7.5 mg/dL — ABNORMAL LOW (ref 8.9–10.3)
Chloride: 113 mmol/L — ABNORMAL HIGH (ref 98–111)
Creatinine, Ser: 0.78 mg/dL (ref 0.61–1.24)
GFR, Estimated: >60 mL/min (ref >60)
Glucose, Bld: 88 mg/dL (ref 70–99)
Potassium: 3.4 mmol/L — ABNORMAL LOW (ref 3.5–5.1)
Sodium: 142 mmol/L (ref 135–145)
Total Bilirubin: 1 mg/dL (ref 0.3–1.2)
Total Protein: 5.4 g/dL — ABNORMAL LOW (ref 6.5–8.1)

## 2020-10-31 LAB — CBC WITH DIFFERENTIAL/PLATELET
Abs Immature Granulocytes: 0.01 10*3/uL (ref 0.00–0.07)
Basophils Absolute: 0 10*3/uL (ref 0.0–0.1)
Basophils Relative: 0 %
Eosinophils Absolute: 0.1 10*3/uL (ref 0.0–0.5)
Eosinophils Relative: 2 %
HCT: 40.2 % (ref 39.0–52.0)
Hemoglobin: 13.2 g/dL (ref 13.0–17.0)
Immature Granulocytes: 0 %
Lymphocytes Relative: 18 %
Lymphs Abs: 1.3 10*3/uL (ref 0.7–4.0)
MCH: 33 pg (ref 26.0–34.0)
MCHC: 32.8 g/dL (ref 30.0–36.0)
MCV: 100.5 fL — ABNORMAL HIGH (ref 80.0–100.0)
Monocytes Absolute: 0.7 10*3/uL (ref 0.1–1.0)
Monocytes Relative: 9 %
Neutro Abs: 5.1 10*3/uL (ref 1.7–7.7)
Neutrophils Relative %: 71 %
Platelets: 184 10*3/uL (ref 150–400)
RBC: 4 MIL/uL — ABNORMAL LOW (ref 4.22–5.81)
RDW: 12.1 % (ref 11.5–15.5)
WBC: 7.1 10*3/uL (ref 4.0–10.5)
nRBC: 0 % (ref 0.0–0.2)

## 2020-10-31 LAB — URINALYSIS, ROUTINE W REFLEX MICROSCOPIC
Bacteria, UA: NONE SEEN
Bilirubin Urine: NEGATIVE
Glucose, UA: NEGATIVE mg/dL
Ketones, ur: NEGATIVE mg/dL
Leukocytes,Ua: NEGATIVE
Nitrite: NEGATIVE
Protein, ur: NEGATIVE mg/dL
Specific Gravity, Urine: 1.006 (ref 1.005–1.030)
pH: 7 (ref 5.0–8.0)

## 2020-10-31 MED ORDER — ACETAMINOPHEN 325 MG PO TABS
650.0000 mg | ORAL_TABLET | Freq: Once | ORAL | Status: DC
  Filled 2020-10-31: qty 2

## 2020-10-31 MED ORDER — SODIUM CHLORIDE 0.9 % IV SOLN: INTRAVENOUS | Status: DC

## 2020-10-31 NOTE — ED Notes (Signed)
RN made aware that BP is high

## 2020-10-31 NOTE — ED Provider Notes (Signed)
[facility] DEPT Provider Note   CSN: [id number] Arrival date & time: 10/31/20  1154     History Chief Complaint  Patient presents with  . Altered Mental Status    [name] [name] is a 86 y.o. male.  86 year old male presents with altered mental status from home.  Patient was noted by relative to be altered around 9 hours ago.  Does have a history of Parkinson's with some neurocognitive features.  The patient himself denies any complaints at this time.  EMS called and patient transported here        Past Medical History:  Diagnosis Date  . Asthma   . Bladder cancer ([place])   . CAD (coronary artery disease)    a. s/p Cypher DES x 2 to prox LAD and Cypher DES x 1 to distal LAD 08/2009;  b. cath with residual D2 50% 10/05 and normal EF;  c. Myoview 6/11: EF 60%, mild fixed INF, inferoseptal, inferoapical thinning, no ischemia, low risk, EF 60%  . Colon polyps   . Essential hypertension, benign 06/16/2012  . GERD (gastroesophageal reflux disease)   . HLD (hyperlipidemia)    h/o elevated LFTs on Zocor  . Nephrolithiasis   . Other abnormal glucose 2012   FBS 104  . Parkinson's disease ([place]) 02/16/2018  . Skin cancer, basal cell    Dr Sherrye Payor    Patient Active Problem List   Diagnosis Date Noted  . 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 Parkinson's disease ([place]) 02/16/2018  . Early dry stage nonexudative age-related macular degeneration of both eyes 07/21/2017  . Hypertensive retinopathy of both eyes 07/21/2017  . Pseudophakia, both eyes 07/21/2017  . Rash 02/12/2017  . Ankle swelling, right 02/12/2017  . Apocrine hidrocystoma 01/05/2013  . Essential hypertension, benign 06/16/2012  . CAD (coronary artery disease) 05/27/2011  . Bladder cancer ([place]) 05/27/2011  . Osteopenia 05/27/2011  . Elevated prostate specific antigen (PSA) 03/28/2008  . Hyperlipidemia 01/31/2008  .  Unspecified hyperplasia of prostate without urinary obstruction and other lower urinary tract symptoms (LUTS) 01/31/2008  . Impotence of organic origin 01/31/2008  . Asthma 09/28/2007  . GERD 07/22/2007    Past Surgical History:  Procedure Laterality Date  . BLADDER TUMOR EXCISION  2011, 2012, 2013   Dr Estill Dooms, [facility]  . CATARACT EXTRACTION  2003   Bilateral, [facility]  . Colonoscopy with polypectomy  2005   Dr. Verl Blalock  . Coronary artery stenting  2005   Dr. Olevia Perches, 3 vessel  . LITHOTRIPSY  2002  . UPPER GASTROINTESTINAL ENDOSCOPY  2005   Esophageal reflux, hiatal hernia       Family History  Problem Relation Age of Onset  . Hypertension Mother   . Stroke Mother        in 25s  . Heart attack Mother        76  . Coronary artery disease Father        16s  . Heart disease Paternal Grandmother        in 52s  . Healthy Child   . Asthma Brother   . Diabetes Brother   . Alcohol abuse Brother   . Prostate cancer Maternal Grandfather     Social History   Tobacco Use  . Smoking status: Former Smoker    Types: Cigarettes    Quit date: 11/17/1960    Years since quitting: 59.9  . Smokeless tobacco: Never Used  .  Tobacco comment: smoked 1948-1962, up to 1/2 ppd  Vaping Use  . Vaping Use: Never used  Substance Use Topics  . Alcohol use: Yes    Alcohol/week: 10.0 standard drinks    Types: 10 Glasses of wine per week    Comment: wine @ dinner  . Drug use: No    Home Medications Prior to Admission medications   Medication Sig Start Date End Date Taking? Authorizing Provider  amLODipine (NORVASC) 10 MG tablet Take 1 tablet (10 mg total) by mouth daily. 01/07/18   Sherren Mocha, MD  aspirin 81 MG tablet Take 81 mg by mouth daily.      [provider]  carbidopa-levodopa (SINEMET IR) 25-100 MG tablet Take 1 tablet by mouth three times daily. 04/17/20   Tat, Eustace Quail, DO  cephALEXin (KEFLEX) 500 MG capsule Take 500 mg by mouth 2 (two) times daily. 06/18/20    [provider]  clobetasol cream (TEMOVATE) 0.05 % Apply topically 2 (two) times daily. 06/18/20   [provider]  clopidogrel (PLAVIX) 75 MG tablet Take 1 tablet (75 mg total) by mouth daily. Please make overdue appt with Dr. Burt Knack before anymore refills. 2nd attempt 02/21/20   Sherren Mocha, MD  donepezil (ARICEPT) 10 MG tablet Take 1 tablet (10 mg total) by mouth at bedtime. 04/06/20   Tat, Eustace Quail, DO  doxycycline (VIBRAMYCIN) 100 MG capsule Take 100 mg by mouth 2 (two) times daily. 09/04/20   [provider]  fluticasone (FLONASE) 50 MCG/ACT nasal spray Place 2 sprays into both nostrils daily. --- Office visit needed for further refills 12/09/16   Binnie Rail, MD  furosemide (LASIX) 20 MG tablet Take 20 mg by mouth daily. 07/18/20   [provider]  nitroGLYCERIN (NITROSTAT) 0.4 MG SL tablet Place 1 tablet (0.4 mg total) under the tongue every 5 (five) minutes as needed for chest pain. 03/27/16   Sherren Mocha, MD  pantoprazole (PROTONIX) 20 MG tablet Take 1 tablet by mouth daily. 03/17/17   [provider]  potassium chloride (KLOR-CON) 10 MEQ tablet Take 10 mEq by mouth daily. 07/13/20   [provider]    Allergies    Hydromorphone hcl, Phenytoin, Zocor [simvastatin - high dose], Crestor [rosuvastatin calcium], Hydromorphone hcl, and Statins  Review of Systems   Review of Systems  Unable to perform ROS: Mental status change    Physical Exam Updated Vital Signs BP (!) 169/76 (BP Location: Right Arm)   Pulse 71   Temp 98.1 F (36.7 C) (Oral)   Resp 15   SpO2 100%   Physical Exam Vitals and nursing note reviewed.  Constitutional:      General: He is not in acute distress.    Appearance: Normal appearance. He is well-developed and well-nourished. He is not toxic-appearing.  HENT:     Head: Normocephalic and atraumatic.  Eyes:     General: Lids are normal.     Extraocular Movements: EOM normal.     Conjunctiva/sclera:  Conjunctivae normal.     Pupils: Pupils are equal, round, and reactive to light.  Neck:     Thyroid: No thyroid mass.     Trachea: No tracheal deviation.  Cardiovascular:     Rate and Rhythm: Normal rate and regular rhythm.     Heart sounds: Normal heart sounds. No murmur heard. No gallop.   Pulmonary:     Effort: Pulmonary effort is normal. No respiratory distress.     Breath sounds: Normal breath sounds. No  stridor. No decreased breath sounds, wheezing, rhonchi or rales.  Abdominal:     General: Bowel sounds are normal. There is no distension.     Palpations: Abdomen is soft.     Tenderness: There is no abdominal tenderness. There is no CVA tenderness or rebound.  Musculoskeletal:        General: No tenderness or edema. Normal range of motion.     Cervical back: Normal range of motion and neck supple.  Skin:    General: Skin is warm and dry.     Findings: No abrasion or rash.  Neurological:     Mental Status: He is alert. He is disoriented.     GCS: GCS eye subscore is 4. GCS verbal subscore is 5. GCS motor subscore is 6.     Cranial Nerves: No cranial nerve deficit.     Sensory: No sensory deficit.     Motor: Tremor present.     Deep Tendon Reflexes: Strength normal.     Comments: Strength is 5 of 5 in upper as well as lower extremities  Psychiatric:        Attention and Perception: He is inattentive.        Mood and Affect: Mood and affect normal. Affect is flat.        Speech: Speech is delayed.        Behavior: Behavior is withdrawn.     ED Results / Procedures / Treatments   Labs (all labs ordered are listed, but only abnormal results are displayed) Labs Reviewed  URINE CULTURE  CBC WITH DIFFERENTIAL/PLATELET  COMPREHENSIVE METABOLIC PANEL  URINALYSIS, ROUTINE W REFLEX MICROSCOPIC    EKG None  Radiology No results found.  Procedures Procedures (including critical care time)  Medications Ordered in ED Medications  0.9 %  sodium chloride infusion (has  no administration in time range)    ED Course  I have reviewed the triage vital signs and the nursing notes.  Pertinent labs & imaging results that were available during my care of the patient were reviewed by me and considered in my medical decision making (see chart for details).    MDM Rules/Calculators/A&P                          Patient's blood work here is reassuring including negative chest x-ray as well as head CT.  Urinalysis negative as well 2.  Discussed with patient's daughter who is comfortable taking him home. Final Clinical Impression(s) / ED Diagnoses Final diagnoses:  None    Rx / DC Orders ED Discharge Orders    None       Lacretia Leigh, MD 10/31/20 1425

## 2020-10-31 NOTE — ED Triage Notes (Signed)
Patient BIBA from home, caregiver reports patient was altered last night around 0345 when she left. Patient is currently alert but confused. Unknown history w/ EMS. Patient currently c/o nausea at this time.  VS WDL   4 mg zofran

## 2020-10-31 NOTE — ED Notes (Addendum)
Bed alarm is on

## 2020-11-01 LAB — URINE CULTURE: Culture: NO GROWTH

## 2020-11-05 ENCOUNTER — Other Ambulatory Visit: Payer: Self-pay

## 2020-11-05 ENCOUNTER — Telehealth: Payer: Self-pay | Admitting: Neurology

## 2020-11-05 MED ORDER — DONEPEZIL HCL 10 MG PO TABS: 10.0000 mg | ORAL_TABLET | Freq: Every day | ORAL | 0 refills | Status: DC

## 2020-11-05 NOTE — Telephone Encounter (Signed)
If one day of med is needed, you may call in that until [place] visit

## 2020-11-05 NOTE — Telephone Encounter (Signed)
Patient's daughter called in needing a refill of the patient's carbidopa-levodopa and donepezil. He will be out very soon, she is worried.

## 2020-11-05 NOTE — Telephone Encounter (Signed)
He no showed his October appt and hasn't been seen in almost a year.  Can she arrange a VV for him tomorrow?

## 2020-11-05 NOTE — Telephone Encounter (Signed)
Rx(s) sent to pharmacy electronically.  Daughter notified and voiced understanding.

## 2020-11-05 NOTE — Progress Notes (Signed)
Virtual Visit Via Video   The purpose of this virtual visit is to provide medical care while limiting exposure to the novel coronavirus.    Consent was obtained for video visit:  Yes.   Answered questions that patient had about telehealth interaction:  Yes.   I discussed the limitations, risks, security and privacy concerns of performing an evaluation and management service by telemedicine. I also discussed with the patient that there may be a patient responsible charge related to this service. The patient expressed understanding and agreed to proceed.  Pt location: Home Physician Location: office Name of referring provider:  Wenda Low, MD I connected with [name] [name] at patients initiation/request on 11/06/2020 at  9:45 AM EST by video enabled telemedicine application and verified that I am speaking with the correct person using two identifiers. Pt MRN:  [id number] Pt DOB:  [birth year] Video Participants:  [name] [name]; daughter and caregiver are both present.  Both supplement history.  Assessment/Plan:   1.  Parkinsons Disease  -Continue carbidopa/levodopa 25/100, 1 tablet 3 times per day  2. B12 deficiency  -On oral supplement  3. Dementia, next PDD and AD  -Neurocognitive testing with Dr. Melvyn Novas in October, 2020 demonstrated severe symptoms.  -Patient on donepezil, 10 mg daily  -Patient having more hallucinations, more aggressive behavior.  We did talk about the fact that the atypical antipsychotic medications are not indicated for dementia related psychosis and increase risk of mortality in the elderly, usually because of infectious or  cardiac related etiologies.  We discussed prolongation of the QT interval and what that means.  We discussed the black box warning in detail and how it applies in this case.  Family believes that QOL is important and they have tried nonpharmacologic therapies (maintaining schedule, attempting proper sleep hydration, redirecting, etc)  without success.  They would like to continue to caregive in the home and believe that this is the only way, but worry that pt is a threat to self/others.  After this long discussion, family decided to try the quetiapine as they feel that the benefits outweigh the risks in this case.  We will start very low-dose, quetiapine, 12.5 mg at bedtime.  -24 hour/day care required.  4.  RBD  -Patient has vivid dreams, but no unsafe behavior.  Did discuss with them getting bed rails.  Subjective   [name] [name] was seen today in follow up for Parkinsons disease.  My previous records were reviewed prior to todays visit as well as outside records available to me.  Patient with daughter who supplements the history.  Patient out of medication, so presented for urgent work in visit.  Patient no showed his visit in October.  Pt denies falls.  Pt denies lightheadedness, near syncope.  Having hallucinations. Can become easily agitated and caregiver states that he will act like he is "killing people.  Its like PTSD."  Dreams have become more active.  Patient was in the emergency room just a few days ago with mental status change.  No etiology was found and patient was discharged home.  CT brain was performed and personally reviewed.  There was moderate atrophy and at least moderate chronic small vessel disease.  Urine culture was negative.  meds being given by cna.  Current prescribed movement disorder medications: Carbidopa/levodopa 25/100, 1 tablet 3 times per day Donepezil, 10 mg daily   Current Outpatient Medications on File Prior to Visit  Medication Sig Dispense Refill  . carbidopa-levodopa (SINEMET IR)  25-100 MG tablet Take 1 tablet by mouth three times daily. 270 tablet 1  . donepezil (ARICEPT) 10 MG tablet Take 1 tablet (10 mg total) by mouth at bedtime. 1 tablet 0  . amLODipine (NORVASC) 10 MG tablet Take 1 tablet (10 mg total) by mouth daily. 90 tablet 3  . aspirin 81 MG tablet Take 81 mg by mouth  daily.      . cephALEXin (KEFLEX) 500 MG capsule Take 500 mg by mouth 2 (two) times daily.    . clobetasol cream (TEMOVATE) 0.05 % Apply topically 2 (two) times daily.    . clopidogrel (PLAVIX) 75 MG tablet Take 1 tablet (75 mg total) by mouth daily. Please make overdue appt with Dr. Burt Knack before anymore refills. 2nd attempt 15 tablet 0  . doxycycline (VIBRAMYCIN) 100 MG capsule Take 100 mg by mouth 2 (two) times daily.    . fluticasone (FLONASE) 50 MCG/ACT nasal spray Place 2 sprays into both nostrils daily. --- Office visit needed for further refills 16 g 0  . furosemide (LASIX) 20 MG tablet Take 20 mg by mouth daily.    . nitroGLYCERIN (NITROSTAT) 0.4 MG SL tablet Place 1 tablet (0.4 mg total) under the tongue every 5 (five) minutes as needed for chest pain. 25 tablet 3  . pantoprazole (PROTONIX) 20 MG tablet Take 1 tablet by mouth daily.  11  . potassium chloride (KLOR-CON) 10 MEQ tablet Take 10 mEq by mouth daily.     No current facility-administered medications on file prior to visit.     Objective   There were no vitals filed for this visit. Patient says very little, with the exception of the fact that he does not recognize the examiner.    Follow up Instructions      -I discussed the assessment and treatment plan with the patient. The patient was provided an opportunity to ask questions and all were answered. The patient agreed with the plan and demonstrated an understanding of the instructions.   The patient was advised to call back or seek an in-person evaluation if the symptoms worsen or if the condition fails to improve as anticipated.    Total time spent on today's visit was 29minutes, including both face-to-face time and nonface-to-face time.  Time included that spent on review of records (prior notes available to me/labs/imaging if pertinent), discussing treatment and goals, answering patient's questions and coordinating care.   Alonza Bogus, DO

## 2020-11-05 NOTE — Telephone Encounter (Signed)
Pt is [place] for a VV with Tat on 11-06-20 and daughter states that he is out of medication and would like to know if she would have to wait until tomorrow to get the medication refilled please call and let her know

## 2020-11-06 ENCOUNTER — Telehealth (INDEPENDENT_AMBULATORY_CARE_PROVIDER_SITE_OTHER): Payer: Medicare Other | Admitting: Neurology

## 2020-11-06 ENCOUNTER — Encounter: Payer: Self-pay | Admitting: Neurology

## 2020-11-06 ENCOUNTER — Other Ambulatory Visit: Payer: Self-pay

## 2020-11-06 DIAGNOSIS — F0281 Dementia in other diseases classified elsewhere with behavioral disturbance: Secondary | ICD-10-CM | POA: Diagnosis not present

## 2020-11-06 DIAGNOSIS — G20 Parkinson's disease: Secondary | ICD-10-CM

## 2020-11-06 MED ORDER — DONEPEZIL HCL 10 MG PO TABS: 10.0000 mg | ORAL_TABLET | Freq: Every day | ORAL | 1 refills | Status: DC

## 2020-11-06 MED ORDER — QUETIAPINE FUMARATE 25 MG PO TABS: 12.5000 mg | ORAL_TABLET | Freq: Every day | ORAL | 1 refills | Status: DC

## 2020-11-06 MED ORDER — CARBIDOPA-LEVODOPA 25-100 MG PO TABS: 1.0000 | ORAL_TABLET | Freq: Three times a day (TID) | ORAL | 1 refills | Status: DC

## 2021-01-07 DIAGNOSIS — C4490 Unspecified malignant neoplasm of skin, unspecified: Secondary | ICD-10-CM | POA: Diagnosis not present

## 2021-01-07 DIAGNOSIS — R300 Dysuria: Secondary | ICD-10-CM | POA: Diagnosis not present

## 2021-01-07 DIAGNOSIS — G20 Parkinson's disease: Secondary | ICD-10-CM | POA: Diagnosis not present

## 2021-01-07 DIAGNOSIS — Z8551 Personal history of malignant neoplasm of bladder: Secondary | ICD-10-CM | POA: Diagnosis not present

## 2021-01-07 DIAGNOSIS — F0390 Unspecified dementia without behavioral disturbance: Secondary | ICD-10-CM | POA: Diagnosis not present

## 2021-01-21 DIAGNOSIS — Z1159 Encounter for screening for other viral diseases: Secondary | ICD-10-CM | POA: Diagnosis not present

## 2021-01-21 DIAGNOSIS — Z20828 Contact with and (suspected) exposure to other viral communicable diseases: Secondary | ICD-10-CM | POA: Diagnosis not present

## 2021-01-28 DIAGNOSIS — Z20828 Contact with and (suspected) exposure to other viral communicable diseases: Secondary | ICD-10-CM | POA: Diagnosis not present

## 2021-01-28 DIAGNOSIS — Z1159 Encounter for screening for other viral diseases: Secondary | ICD-10-CM | POA: Diagnosis not present

## 2021-01-31 ENCOUNTER — Telehealth: Payer: Self-pay

## 2021-01-31 ENCOUNTER — Telehealth: Payer: Self-pay | Admitting: Neurology

## 2021-01-31 MED ORDER — QUETIAPINE FUMARATE 25 MG PO TABS: 12.5000 mg | ORAL_TABLET | Freq: Every day | ORAL | 0 refills | Status: DC

## 2021-01-31 NOTE — Telephone Encounter (Signed)
Refill sent for pt

## 2021-02-04 DIAGNOSIS — Z1159 Encounter for screening for other viral diseases: Secondary | ICD-10-CM | POA: Diagnosis not present

## 2021-02-04 DIAGNOSIS — Z20828 Contact with and (suspected) exposure to other viral communicable diseases: Secondary | ICD-10-CM | POA: Diagnosis not present

## 2021-02-08 ENCOUNTER — Ambulatory Visit: Payer: Medicare Other | Admitting: Neurology

## 2021-02-11 DIAGNOSIS — Z1159 Encounter for screening for other viral diseases: Secondary | ICD-10-CM | POA: Diagnosis not present

## 2021-02-11 DIAGNOSIS — Z20828 Contact with and (suspected) exposure to other viral communicable diseases: Secondary | ICD-10-CM | POA: Diagnosis not present

## 2021-02-18 DIAGNOSIS — Z20828 Contact with and (suspected) exposure to other viral communicable diseases: Secondary | ICD-10-CM | POA: Diagnosis not present

## 2021-02-18 DIAGNOSIS — Z1159 Encounter for screening for other viral diseases: Secondary | ICD-10-CM | POA: Diagnosis not present

## 2021-02-25 DIAGNOSIS — Z1159 Encounter for screening for other viral diseases: Secondary | ICD-10-CM | POA: Diagnosis not present

## 2021-02-25 DIAGNOSIS — Z20828 Contact with and (suspected) exposure to other viral communicable diseases: Secondary | ICD-10-CM | POA: Diagnosis not present

## 2021-03-04 DIAGNOSIS — Z20828 Contact with and (suspected) exposure to other viral communicable diseases: Secondary | ICD-10-CM | POA: Diagnosis not present

## 2021-03-04 DIAGNOSIS — Z1159 Encounter for screening for other viral diseases: Secondary | ICD-10-CM | POA: Diagnosis not present

## 2021-03-11 DIAGNOSIS — Z1159 Encounter for screening for other viral diseases: Secondary | ICD-10-CM | POA: Diagnosis not present

## 2021-03-11 DIAGNOSIS — Z20828 Contact with and (suspected) exposure to other viral communicable diseases: Secondary | ICD-10-CM | POA: Diagnosis not present

## 2021-03-18 DIAGNOSIS — Z1159 Encounter for screening for other viral diseases: Secondary | ICD-10-CM | POA: Diagnosis not present

## 2021-03-18 DIAGNOSIS — Z20828 Contact with and (suspected) exposure to other viral communicable diseases: Secondary | ICD-10-CM | POA: Diagnosis not present

## 2021-03-25 DIAGNOSIS — Z1159 Encounter for screening for other viral diseases: Secondary | ICD-10-CM | POA: Diagnosis not present

## 2021-03-25 DIAGNOSIS — Z20828 Contact with and (suspected) exposure to other viral communicable diseases: Secondary | ICD-10-CM | POA: Diagnosis not present

## 2021-03-28 DIAGNOSIS — R296 Repeated falls: Secondary | ICD-10-CM | POA: Diagnosis not present

## 2021-03-28 DIAGNOSIS — R278 Other lack of coordination: Secondary | ICD-10-CM | POA: Diagnosis not present

## 2021-03-28 DIAGNOSIS — R41841 Cognitive communication deficit: Secondary | ICD-10-CM | POA: Diagnosis not present

## 2021-03-28 DIAGNOSIS — R2681 Unsteadiness on feet: Secondary | ICD-10-CM | POA: Diagnosis not present

## 2021-03-28 DIAGNOSIS — R2689 Other abnormalities of gait and mobility: Secondary | ICD-10-CM | POA: Diagnosis not present

## 2021-03-28 DIAGNOSIS — R488 Other symbolic dysfunctions: Secondary | ICD-10-CM | POA: Diagnosis not present

## 2021-03-28 DIAGNOSIS — M6281 Muscle weakness (generalized): Secondary | ICD-10-CM | POA: Diagnosis not present

## 2021-03-28 DIAGNOSIS — G20 Parkinson's disease: Secondary | ICD-10-CM | POA: Diagnosis not present

## 2021-03-29 DIAGNOSIS — R278 Other lack of coordination: Secondary | ICD-10-CM | POA: Diagnosis not present

## 2021-03-29 DIAGNOSIS — R2689 Other abnormalities of gait and mobility: Secondary | ICD-10-CM | POA: Diagnosis not present

## 2021-03-29 DIAGNOSIS — M6281 Muscle weakness (generalized): Secondary | ICD-10-CM | POA: Diagnosis not present

## 2021-03-29 DIAGNOSIS — R41841 Cognitive communication deficit: Secondary | ICD-10-CM | POA: Diagnosis not present

## 2021-03-29 DIAGNOSIS — R2681 Unsteadiness on feet: Secondary | ICD-10-CM | POA: Diagnosis not present

## 2021-03-29 DIAGNOSIS — G20 Parkinson's disease: Secondary | ICD-10-CM | POA: Diagnosis not present

## 2021-04-01 DIAGNOSIS — R2681 Unsteadiness on feet: Secondary | ICD-10-CM | POA: Diagnosis not present

## 2021-04-01 DIAGNOSIS — G20 Parkinson's disease: Secondary | ICD-10-CM | POA: Diagnosis not present

## 2021-04-01 DIAGNOSIS — M6281 Muscle weakness (generalized): Secondary | ICD-10-CM | POA: Diagnosis not present

## 2021-04-01 DIAGNOSIS — Z20828 Contact with and (suspected) exposure to other viral communicable diseases: Secondary | ICD-10-CM | POA: Diagnosis not present

## 2021-04-01 DIAGNOSIS — R41841 Cognitive communication deficit: Secondary | ICD-10-CM | POA: Diagnosis not present

## 2021-04-01 DIAGNOSIS — R2689 Other abnormalities of gait and mobility: Secondary | ICD-10-CM | POA: Diagnosis not present

## 2021-04-01 DIAGNOSIS — Z1159 Encounter for screening for other viral diseases: Secondary | ICD-10-CM | POA: Diagnosis not present

## 2021-04-01 DIAGNOSIS — R278 Other lack of coordination: Secondary | ICD-10-CM | POA: Diagnosis not present

## 2021-04-02 DIAGNOSIS — R41841 Cognitive communication deficit: Secondary | ICD-10-CM | POA: Diagnosis not present

## 2021-04-02 DIAGNOSIS — M6281 Muscle weakness (generalized): Secondary | ICD-10-CM | POA: Diagnosis not present

## 2021-04-02 DIAGNOSIS — R2689 Other abnormalities of gait and mobility: Secondary | ICD-10-CM | POA: Diagnosis not present

## 2021-04-02 DIAGNOSIS — G20 Parkinson's disease: Secondary | ICD-10-CM | POA: Diagnosis not present

## 2021-04-02 DIAGNOSIS — R278 Other lack of coordination: Secondary | ICD-10-CM | POA: Diagnosis not present

## 2021-04-02 DIAGNOSIS — R2681 Unsteadiness on feet: Secondary | ICD-10-CM | POA: Diagnosis not present

## 2021-04-02 NOTE — Progress Notes (Signed)
Assessment/Plan:   1.  Parkinsons Disease  -increase Carbidopa/levodopa 25/100, 1.5 tablet 3 times per day.  He is underdosed and just slightly will increase dose.  Worry about making hallucinations worse with increasing too much.  Will try to increase to 1.5 tid.  Daughter worries home health care not splitting/won't split.  They will let me know and if not, will try dhivy but doubt insurance will pay.  -suspect eye issues related to dry eye.  Follow up with ophth.   Discussed adding eye drops/saline and lubricate liberally.  -will send PT via legacy to [place] at least to help him with a walker (he apparently got 1 yesterday).  2.  Dementia, mixed PDD and AD, severe  -On donepezil, 10 mg daily  -was given low-dose quetiapine, 12.5 mg at bedtime but they thought that it caused wandering at bed.  I am not sure that was the case, but nonetheless it was stopped by patient/family.  We can consider Nuplazid in the future if needed.   Subjective:   [name] [name] was seen today in follow up for Parkinsons disease.  My previous records were reviewed prior to todays visit as well as outside records available to me.  Daughter is with him and supplemented history and caregiver is on the phone.  Several falls in the last few weeks.   Pt denies lightheadedness, near syncope.  Started low-dose quetiapine last visit for agitation, hallucinations, acting out, dreams.  Reports today that it was helpful for a few weeks but then he seemed to wander and get out of bed at night.  They weren't sure if it was from the medication but he is doing better and sleeping better.  Hallucinations are some better per daughter but then caregiver gets on phone and says that they are "horrid."  When I asked her specifically about those, she started telling me about "PTSD."  She noted that most of the events arose out of sleep or when he was just somewhat sleepy, but then had difficulty describing to me hallucinations or  abnormal behavior during the day.  Noting some vision issues - ? If double.  Current prescribed movement disorder medications:  Donepezil, 10 mg daily Quetiapine 12.5 mg at bedtime (started last visit) Carbidopa/levodopa 25/100, 1 tablet 3 times per day   PREVIOUS MEDICATIONS: Sinemet  ALLERGIES:   Allergies  Allergen Reactions  . Hydromorphone Hcl      Dilaudid Rxed for renal calculi caused angioedema   . Phenytoin Swelling  . Zocor [Simvastatin - High Dose]     Elevated hepatic enzymes  . Crestor [Rosuvastatin Calcium] Other (See Comments)    Leg pain. Not as bad since on a lower dose  . Hydromorphone Hcl      Dilaudid Rxed for renal calculi caused angioedema  . Statins     Leg pain    CURRENT MEDICATIONS:  Outpatient Encounter Medications as of 04/04/2021  Medication Sig  . fluticasone (FLONASE) 50 MCG/ACT nasal spray Place 2 sprays into both nostrils daily. --- Office visit needed for further refills  . nitroGLYCERIN (NITROSTAT) 0.4 MG SL tablet Place 1 tablet (0.4 mg total) under the tongue every 5 (five) minutes as needed for chest pain.  . pantoprazole (PROTONIX) 20 MG tablet Take 1 tablet by mouth daily.  . [DISCONTINUED] carbidopa-levodopa (SINEMET IR) 25-100 MG tablet Take 1 tablet by mouth 3 (three) times daily.  . [DISCONTINUED] donepezil (ARICEPT) 10 MG tablet Take 1 tablet (10 mg total) by mouth  at bedtime.  Marland Kitchen amLODipine (NORVASC) 10 MG tablet Take 1 tablet (10 mg total) by mouth daily. (Patient not taking: Reported on 04/04/2021)  . aspirin 81 MG tablet Take 81 mg by mouth daily.   (Patient not taking: Reported on 04/04/2021)  . carbidopa-levodopa (SINEMET IR) 25-100 MG tablet Take 1.5 tablets by mouth 3 (three) times daily.  . cephALEXin (KEFLEX) 500 MG capsule Take 500 mg by mouth 2 (two) times daily. (Patient not taking: Reported on 04/04/2021)  . clobetasol cream (TEMOVATE) 0.05 % Apply topically 2 (two) times daily. (Patient not taking: Reported on 04/04/2021)   . clopidogrel (PLAVIX) 75 MG tablet Take 1 tablet (75 mg total) by mouth daily. Please make overdue appt with Dr. Burt Knack before anymore refills. 2nd attempt (Patient not taking: Reported on 04/04/2021)  . donepezil (ARICEPT) 10 MG tablet Take 1 tablet (10 mg total) by mouth at bedtime.  Marland Kitchen doxycycline (VIBRAMYCIN) 100 MG capsule Take 100 mg by mouth 2 (two) times daily. (Patient not taking: Reported on 04/04/2021)  . furosemide (LASIX) 20 MG tablet Take 20 mg by mouth daily. (Patient not taking: Reported on 04/04/2021)  . potassium chloride (KLOR-CON) 10 MEQ tablet Take 10 mEq by mouth daily. (Patient not taking: Reported on 04/04/2021)  . QUEtiapine (SEROQUEL) 25 MG tablet Take 0.5 tablets (12.5 mg total) by mouth at bedtime. (Patient not taking: Reported on 04/04/2021)   No facility-administered encounter medications on file as of 04/04/2021.    Objective:   PHYSICAL EXAMINATION:    VITALS:   Vitals:   04/04/21 1043  BP: (!) 118/59  Pulse: 98  SpO2: 95%  Weight: 137 lb 3.2 oz (62.2 kg)    GEN:  The patient appears stated age and is in NAD. HEENT:  Normocephalic, atraumatic.  The mucous membranes are moist. The superficial temporal arteries are without ropiness or tenderness. CV:  RRR Lungs:  CTAB Neck/HEME:  There are no carotid bruits bilaterally.  Neurological examination:  Orientation: The patient is alert and oriented to person only.  He has trouble staying with the conversation. Cranial nerves: There is good facial symmetry with facial hypomimia. The speech is fluent and clear. Soft palate rises symmetrically and there is no tongue deviation. Hearing is intact to conversational tone. Sensation: Sensation is intact to light touch throughout Motor: Strength is at least antigravity x4.  Movement examination: Tone: There is mild to mod tone increased tone in the bilateral UE Abnormal movements: there is rare tremor in the UE Coordination:  There is slowness of rapid alternating  movements, but much of this is due to apraxia. Gait and Station: The patient pushes off of the chair to arise.  The patient's stride length is decreased and he is shuffling and is not well-balanced.  He definitely has trouble in the turn.    I have reviewed and interpreted the following labs independently    Chemistry      Component Value Date/Time   NA 142 10/31/2020 1231   K 3.4 (L) 10/31/2020 1231   CL 113 (H) 10/31/2020 1231   CO2 19 (L) 10/31/2020 1231   BUN 18 10/31/2020 1231   CREATININE 0.78 10/31/2020 1231   CREATININE 1.01 01/27/2019 1405      Component Value Date/Time   CALCIUM 7.5 (L) 10/31/2020 1231   ALKPHOS 109 10/31/2020 1231   AST 16 10/31/2020 1231   ALT 22 10/31/2020 1231   BILITOT 1.0 10/31/2020 1231       Lab Results  Component Value Date  WBC 7.1 10/31/2020   HGB 13.2 10/31/2020   HCT 40.2 10/31/2020   MCV 100.5 (H) 10/31/2020   PLT 184 10/31/2020    Lab Results  Component Value Date   TSH 2.74 01/27/2019     Total time spent on today's visit was 30 minutes, including both face-to-face time and nonface-to-face time.  Time included that spent on review of records (prior notes available to me/labs/imaging if pertinent), discussing treatment and goals, answering patient's questions and coordinating care.  Cc:  Wenda Low, MD

## 2021-04-03 DIAGNOSIS — M6281 Muscle weakness (generalized): Secondary | ICD-10-CM | POA: Diagnosis not present

## 2021-04-03 DIAGNOSIS — R41841 Cognitive communication deficit: Secondary | ICD-10-CM | POA: Diagnosis not present

## 2021-04-03 DIAGNOSIS — R278 Other lack of coordination: Secondary | ICD-10-CM | POA: Diagnosis not present

## 2021-04-03 DIAGNOSIS — R2681 Unsteadiness on feet: Secondary | ICD-10-CM | POA: Diagnosis not present

## 2021-04-03 DIAGNOSIS — G20 Parkinson's disease: Secondary | ICD-10-CM | POA: Diagnosis not present

## 2021-04-03 DIAGNOSIS — R2689 Other abnormalities of gait and mobility: Secondary | ICD-10-CM | POA: Diagnosis not present

## 2021-04-04 ENCOUNTER — Other Ambulatory Visit: Payer: Self-pay

## 2021-04-04 ENCOUNTER — Ambulatory Visit (INDEPENDENT_AMBULATORY_CARE_PROVIDER_SITE_OTHER): Payer: Medicare Other | Admitting: Neurology

## 2021-04-04 ENCOUNTER — Encounter: Payer: Self-pay | Admitting: Neurology

## 2021-04-04 VITALS — BP 118/59 | HR 98 | Wt 137.2 lb

## 2021-04-04 DIAGNOSIS — R2681 Unsteadiness on feet: Secondary | ICD-10-CM | POA: Diagnosis not present

## 2021-04-04 DIAGNOSIS — M6281 Muscle weakness (generalized): Secondary | ICD-10-CM | POA: Diagnosis not present

## 2021-04-04 DIAGNOSIS — F0281 Dementia in other diseases classified elsewhere with behavioral disturbance: Secondary | ICD-10-CM

## 2021-04-04 DIAGNOSIS — R278 Other lack of coordination: Secondary | ICD-10-CM | POA: Diagnosis not present

## 2021-04-04 DIAGNOSIS — R41841 Cognitive communication deficit: Secondary | ICD-10-CM | POA: Diagnosis not present

## 2021-04-04 DIAGNOSIS — R2689 Other abnormalities of gait and mobility: Secondary | ICD-10-CM | POA: Diagnosis not present

## 2021-04-04 DIAGNOSIS — G20 Parkinson's disease: Secondary | ICD-10-CM

## 2021-04-04 MED ORDER — CARBIDOPA-LEVODOPA 25-100 MG PO TABS: 1.5000 | ORAL_TABLET | Freq: Three times a day (TID) | ORAL | 1 refills | Status: DC

## 2021-04-04 MED ORDER — DONEPEZIL HCL 10 MG PO TABS: 10.0000 mg | ORAL_TABLET | Freq: Every day | ORAL | 1 refills | Status: DC

## 2021-04-04 NOTE — Patient Instructions (Addendum)
1.  Take carbidopa/levodopa 25/100, 1.5 tablets at 8am/noon/4pm 2.  PT will be sent to the home 3.  Use walker at all times! 4.  We can consider medication in future for hallucinations, like nuplazid. 5. Follow up 6 months.  The physicians and staff at [facility] are committed to providing excellent care. You may receive a survey requesting feedback about your experience at our office. We strive to receive "very good" responses to the survey questions. If you feel that your experience would prevent you from giving the office a "very good " response, please contact our office to try to remedy the situation. We may be reached at [phone]. Thank you for taking the time out of your busy day to complete the survey.

## 2021-04-05 DIAGNOSIS — R2689 Other abnormalities of gait and mobility: Secondary | ICD-10-CM | POA: Diagnosis not present

## 2021-04-05 DIAGNOSIS — R2681 Unsteadiness on feet: Secondary | ICD-10-CM | POA: Diagnosis not present

## 2021-04-05 DIAGNOSIS — M6281 Muscle weakness (generalized): Secondary | ICD-10-CM | POA: Diagnosis not present

## 2021-04-05 DIAGNOSIS — R41841 Cognitive communication deficit: Secondary | ICD-10-CM | POA: Diagnosis not present

## 2021-04-05 DIAGNOSIS — R278 Other lack of coordination: Secondary | ICD-10-CM | POA: Diagnosis not present

## 2021-04-05 DIAGNOSIS — G20 Parkinson's disease: Secondary | ICD-10-CM | POA: Diagnosis not present

## 2021-04-08 DIAGNOSIS — M6281 Muscle weakness (generalized): Secondary | ICD-10-CM | POA: Diagnosis not present

## 2021-04-08 DIAGNOSIS — Z20828 Contact with and (suspected) exposure to other viral communicable diseases: Secondary | ICD-10-CM | POA: Diagnosis not present

## 2021-04-08 DIAGNOSIS — R278 Other lack of coordination: Secondary | ICD-10-CM | POA: Diagnosis not present

## 2021-04-08 DIAGNOSIS — R2689 Other abnormalities of gait and mobility: Secondary | ICD-10-CM | POA: Diagnosis not present

## 2021-04-08 DIAGNOSIS — R2681 Unsteadiness on feet: Secondary | ICD-10-CM | POA: Diagnosis not present

## 2021-04-08 DIAGNOSIS — Z1159 Encounter for screening for other viral diseases: Secondary | ICD-10-CM | POA: Diagnosis not present

## 2021-04-08 DIAGNOSIS — G20 Parkinson's disease: Secondary | ICD-10-CM | POA: Diagnosis not present

## 2021-04-08 DIAGNOSIS — R41841 Cognitive communication deficit: Secondary | ICD-10-CM | POA: Diagnosis not present

## 2021-04-09 DIAGNOSIS — G20 Parkinson's disease: Secondary | ICD-10-CM | POA: Diagnosis not present

## 2021-04-09 DIAGNOSIS — R2681 Unsteadiness on feet: Secondary | ICD-10-CM | POA: Diagnosis not present

## 2021-04-09 DIAGNOSIS — R2689 Other abnormalities of gait and mobility: Secondary | ICD-10-CM | POA: Diagnosis not present

## 2021-04-09 DIAGNOSIS — R41841 Cognitive communication deficit: Secondary | ICD-10-CM | POA: Diagnosis not present

## 2021-04-09 DIAGNOSIS — M6281 Muscle weakness (generalized): Secondary | ICD-10-CM | POA: Diagnosis not present

## 2021-04-09 DIAGNOSIS — R278 Other lack of coordination: Secondary | ICD-10-CM | POA: Diagnosis not present

## 2021-04-10 DIAGNOSIS — M6281 Muscle weakness (generalized): Secondary | ICD-10-CM | POA: Diagnosis not present

## 2021-04-10 DIAGNOSIS — R41841 Cognitive communication deficit: Secondary | ICD-10-CM | POA: Diagnosis not present

## 2021-04-10 DIAGNOSIS — G20 Parkinson's disease: Secondary | ICD-10-CM | POA: Diagnosis not present

## 2021-04-10 DIAGNOSIS — R2689 Other abnormalities of gait and mobility: Secondary | ICD-10-CM | POA: Diagnosis not present

## 2021-04-10 DIAGNOSIS — R278 Other lack of coordination: Secondary | ICD-10-CM | POA: Diagnosis not present

## 2021-04-10 DIAGNOSIS — R2681 Unsteadiness on feet: Secondary | ICD-10-CM | POA: Diagnosis not present

## 2021-04-12 DIAGNOSIS — R2689 Other abnormalities of gait and mobility: Secondary | ICD-10-CM | POA: Diagnosis not present

## 2021-04-12 DIAGNOSIS — R41841 Cognitive communication deficit: Secondary | ICD-10-CM | POA: Diagnosis not present

## 2021-04-12 DIAGNOSIS — M6281 Muscle weakness (generalized): Secondary | ICD-10-CM | POA: Diagnosis not present

## 2021-04-12 DIAGNOSIS — R278 Other lack of coordination: Secondary | ICD-10-CM | POA: Diagnosis not present

## 2021-04-12 DIAGNOSIS — G20 Parkinson's disease: Secondary | ICD-10-CM | POA: Diagnosis not present

## 2021-04-12 DIAGNOSIS — R2681 Unsteadiness on feet: Secondary | ICD-10-CM | POA: Diagnosis not present

## 2021-04-15 DIAGNOSIS — Z1159 Encounter for screening for other viral diseases: Secondary | ICD-10-CM | POA: Diagnosis not present

## 2021-04-15 DIAGNOSIS — Z20828 Contact with and (suspected) exposure to other viral communicable diseases: Secondary | ICD-10-CM | POA: Diagnosis not present

## 2021-04-16 DIAGNOSIS — R278 Other lack of coordination: Secondary | ICD-10-CM | POA: Diagnosis not present

## 2021-04-16 DIAGNOSIS — R41841 Cognitive communication deficit: Secondary | ICD-10-CM | POA: Diagnosis not present

## 2021-04-16 DIAGNOSIS — G20 Parkinson's disease: Secondary | ICD-10-CM | POA: Diagnosis not present

## 2021-04-16 DIAGNOSIS — M6281 Muscle weakness (generalized): Secondary | ICD-10-CM | POA: Diagnosis not present

## 2021-04-16 DIAGNOSIS — R2681 Unsteadiness on feet: Secondary | ICD-10-CM | POA: Diagnosis not present

## 2021-04-16 DIAGNOSIS — R2689 Other abnormalities of gait and mobility: Secondary | ICD-10-CM | POA: Diagnosis not present

## 2021-04-17 DIAGNOSIS — M6281 Muscle weakness (generalized): Secondary | ICD-10-CM | POA: Diagnosis not present

## 2021-04-17 DIAGNOSIS — R296 Repeated falls: Secondary | ICD-10-CM | POA: Diagnosis not present

## 2021-04-17 DIAGNOSIS — R278 Other lack of coordination: Secondary | ICD-10-CM | POA: Diagnosis not present

## 2021-04-17 DIAGNOSIS — R41841 Cognitive communication deficit: Secondary | ICD-10-CM | POA: Diagnosis not present

## 2021-04-17 DIAGNOSIS — R2689 Other abnormalities of gait and mobility: Secondary | ICD-10-CM | POA: Diagnosis not present

## 2021-04-17 DIAGNOSIS — G20 Parkinson's disease: Secondary | ICD-10-CM | POA: Diagnosis not present

## 2021-04-17 DIAGNOSIS — R488 Other symbolic dysfunctions: Secondary | ICD-10-CM | POA: Diagnosis not present

## 2021-04-17 DIAGNOSIS — R2681 Unsteadiness on feet: Secondary | ICD-10-CM | POA: Diagnosis not present

## 2021-04-18 DIAGNOSIS — R278 Other lack of coordination: Secondary | ICD-10-CM | POA: Diagnosis not present

## 2021-04-18 DIAGNOSIS — G20 Parkinson's disease: Secondary | ICD-10-CM | POA: Diagnosis not present

## 2021-04-18 DIAGNOSIS — M6281 Muscle weakness (generalized): Secondary | ICD-10-CM | POA: Diagnosis not present

## 2021-04-18 DIAGNOSIS — R2689 Other abnormalities of gait and mobility: Secondary | ICD-10-CM | POA: Diagnosis not present

## 2021-04-18 DIAGNOSIS — R41841 Cognitive communication deficit: Secondary | ICD-10-CM | POA: Diagnosis not present

## 2021-04-18 DIAGNOSIS — R2681 Unsteadiness on feet: Secondary | ICD-10-CM | POA: Diagnosis not present

## 2021-04-22 DIAGNOSIS — R2681 Unsteadiness on feet: Secondary | ICD-10-CM | POA: Diagnosis not present

## 2021-04-22 DIAGNOSIS — M6281 Muscle weakness (generalized): Secondary | ICD-10-CM | POA: Diagnosis not present

## 2021-04-22 DIAGNOSIS — R2689 Other abnormalities of gait and mobility: Secondary | ICD-10-CM | POA: Diagnosis not present

## 2021-04-22 DIAGNOSIS — R278 Other lack of coordination: Secondary | ICD-10-CM | POA: Diagnosis not present

## 2021-04-22 DIAGNOSIS — G20 Parkinson's disease: Secondary | ICD-10-CM | POA: Diagnosis not present

## 2021-04-22 DIAGNOSIS — Z20828 Contact with and (suspected) exposure to other viral communicable diseases: Secondary | ICD-10-CM | POA: Diagnosis not present

## 2021-04-22 DIAGNOSIS — Z1159 Encounter for screening for other viral diseases: Secondary | ICD-10-CM | POA: Diagnosis not present

## 2021-04-22 DIAGNOSIS — R41841 Cognitive communication deficit: Secondary | ICD-10-CM | POA: Diagnosis not present

## 2021-04-23 DIAGNOSIS — R278 Other lack of coordination: Secondary | ICD-10-CM | POA: Diagnosis not present

## 2021-04-23 DIAGNOSIS — R2681 Unsteadiness on feet: Secondary | ICD-10-CM | POA: Diagnosis not present

## 2021-04-23 DIAGNOSIS — G20 Parkinson's disease: Secondary | ICD-10-CM | POA: Diagnosis not present

## 2021-04-23 DIAGNOSIS — R41841 Cognitive communication deficit: Secondary | ICD-10-CM | POA: Diagnosis not present

## 2021-04-23 DIAGNOSIS — R2689 Other abnormalities of gait and mobility: Secondary | ICD-10-CM | POA: Diagnosis not present

## 2021-04-23 DIAGNOSIS — M6281 Muscle weakness (generalized): Secondary | ICD-10-CM | POA: Diagnosis not present

## 2021-04-24 DIAGNOSIS — R2681 Unsteadiness on feet: Secondary | ICD-10-CM | POA: Diagnosis not present

## 2021-04-24 DIAGNOSIS — R278 Other lack of coordination: Secondary | ICD-10-CM | POA: Diagnosis not present

## 2021-04-24 DIAGNOSIS — R2689 Other abnormalities of gait and mobility: Secondary | ICD-10-CM | POA: Diagnosis not present

## 2021-04-24 DIAGNOSIS — M6281 Muscle weakness (generalized): Secondary | ICD-10-CM | POA: Diagnosis not present

## 2021-04-24 DIAGNOSIS — R41841 Cognitive communication deficit: Secondary | ICD-10-CM | POA: Diagnosis not present

## 2021-04-24 DIAGNOSIS — G20 Parkinson's disease: Secondary | ICD-10-CM | POA: Diagnosis not present

## 2021-04-25 DIAGNOSIS — R278 Other lack of coordination: Secondary | ICD-10-CM | POA: Diagnosis not present

## 2021-04-25 DIAGNOSIS — R2681 Unsteadiness on feet: Secondary | ICD-10-CM | POA: Diagnosis not present

## 2021-04-25 DIAGNOSIS — M6281 Muscle weakness (generalized): Secondary | ICD-10-CM | POA: Diagnosis not present

## 2021-04-25 DIAGNOSIS — R41841 Cognitive communication deficit: Secondary | ICD-10-CM | POA: Diagnosis not present

## 2021-04-25 DIAGNOSIS — G20 Parkinson's disease: Secondary | ICD-10-CM | POA: Diagnosis not present

## 2021-04-25 DIAGNOSIS — R2689 Other abnormalities of gait and mobility: Secondary | ICD-10-CM | POA: Diagnosis not present

## 2021-04-26 DIAGNOSIS — R2681 Unsteadiness on feet: Secondary | ICD-10-CM | POA: Diagnosis not present

## 2021-04-26 DIAGNOSIS — M6281 Muscle weakness (generalized): Secondary | ICD-10-CM | POA: Diagnosis not present

## 2021-04-26 DIAGNOSIS — R278 Other lack of coordination: Secondary | ICD-10-CM | POA: Diagnosis not present

## 2021-04-26 DIAGNOSIS — R2689 Other abnormalities of gait and mobility: Secondary | ICD-10-CM | POA: Diagnosis not present

## 2021-04-26 DIAGNOSIS — R41841 Cognitive communication deficit: Secondary | ICD-10-CM | POA: Diagnosis not present

## 2021-04-26 DIAGNOSIS — G20 Parkinson's disease: Secondary | ICD-10-CM | POA: Diagnosis not present

## 2021-04-29 DIAGNOSIS — R2681 Unsteadiness on feet: Secondary | ICD-10-CM | POA: Diagnosis not present

## 2021-04-29 DIAGNOSIS — M6281 Muscle weakness (generalized): Secondary | ICD-10-CM | POA: Diagnosis not present

## 2021-04-29 DIAGNOSIS — Z1159 Encounter for screening for other viral diseases: Secondary | ICD-10-CM | POA: Diagnosis not present

## 2021-04-29 DIAGNOSIS — G20 Parkinson's disease: Secondary | ICD-10-CM | POA: Diagnosis not present

## 2021-04-29 DIAGNOSIS — R278 Other lack of coordination: Secondary | ICD-10-CM | POA: Diagnosis not present

## 2021-04-29 DIAGNOSIS — R2689 Other abnormalities of gait and mobility: Secondary | ICD-10-CM | POA: Diagnosis not present

## 2021-04-29 DIAGNOSIS — Z20828 Contact with and (suspected) exposure to other viral communicable diseases: Secondary | ICD-10-CM | POA: Diagnosis not present

## 2021-04-29 DIAGNOSIS — R41841 Cognitive communication deficit: Secondary | ICD-10-CM | POA: Diagnosis not present

## 2021-04-30 DIAGNOSIS — R41841 Cognitive communication deficit: Secondary | ICD-10-CM | POA: Diagnosis not present

## 2021-04-30 DIAGNOSIS — R2689 Other abnormalities of gait and mobility: Secondary | ICD-10-CM | POA: Diagnosis not present

## 2021-04-30 DIAGNOSIS — R2681 Unsteadiness on feet: Secondary | ICD-10-CM | POA: Diagnosis not present

## 2021-04-30 DIAGNOSIS — R278 Other lack of coordination: Secondary | ICD-10-CM | POA: Diagnosis not present

## 2021-04-30 DIAGNOSIS — M6281 Muscle weakness (generalized): Secondary | ICD-10-CM | POA: Diagnosis not present

## 2021-04-30 DIAGNOSIS — G20 Parkinson's disease: Secondary | ICD-10-CM | POA: Diagnosis not present

## 2021-05-01 DIAGNOSIS — M6281 Muscle weakness (generalized): Secondary | ICD-10-CM | POA: Diagnosis not present

## 2021-05-01 DIAGNOSIS — R2681 Unsteadiness on feet: Secondary | ICD-10-CM | POA: Diagnosis not present

## 2021-05-01 DIAGNOSIS — R278 Other lack of coordination: Secondary | ICD-10-CM | POA: Diagnosis not present

## 2021-05-01 DIAGNOSIS — R2689 Other abnormalities of gait and mobility: Secondary | ICD-10-CM | POA: Diagnosis not present

## 2021-05-01 DIAGNOSIS — G20 Parkinson's disease: Secondary | ICD-10-CM | POA: Diagnosis not present

## 2021-05-01 DIAGNOSIS — R41841 Cognitive communication deficit: Secondary | ICD-10-CM | POA: Diagnosis not present

## 2021-05-02 DIAGNOSIS — G20 Parkinson's disease: Secondary | ICD-10-CM | POA: Diagnosis not present

## 2021-05-02 DIAGNOSIS — R2689 Other abnormalities of gait and mobility: Secondary | ICD-10-CM | POA: Diagnosis not present

## 2021-05-02 DIAGNOSIS — M6281 Muscle weakness (generalized): Secondary | ICD-10-CM | POA: Diagnosis not present

## 2021-05-02 DIAGNOSIS — R278 Other lack of coordination: Secondary | ICD-10-CM | POA: Diagnosis not present

## 2021-05-02 DIAGNOSIS — R2681 Unsteadiness on feet: Secondary | ICD-10-CM | POA: Diagnosis not present

## 2021-05-02 DIAGNOSIS — R41841 Cognitive communication deficit: Secondary | ICD-10-CM | POA: Diagnosis not present

## 2021-05-06 DIAGNOSIS — R278 Other lack of coordination: Secondary | ICD-10-CM | POA: Diagnosis not present

## 2021-05-06 DIAGNOSIS — R2681 Unsteadiness on feet: Secondary | ICD-10-CM | POA: Diagnosis not present

## 2021-05-06 DIAGNOSIS — M6281 Muscle weakness (generalized): Secondary | ICD-10-CM | POA: Diagnosis not present

## 2021-05-06 DIAGNOSIS — Z1159 Encounter for screening for other viral diseases: Secondary | ICD-10-CM | POA: Diagnosis not present

## 2021-05-06 DIAGNOSIS — G20 Parkinson's disease: Secondary | ICD-10-CM | POA: Diagnosis not present

## 2021-05-06 DIAGNOSIS — R2689 Other abnormalities of gait and mobility: Secondary | ICD-10-CM | POA: Diagnosis not present

## 2021-05-06 DIAGNOSIS — R41841 Cognitive communication deficit: Secondary | ICD-10-CM | POA: Diagnosis not present

## 2021-05-06 DIAGNOSIS — Z20828 Contact with and (suspected) exposure to other viral communicable diseases: Secondary | ICD-10-CM | POA: Diagnosis not present

## 2021-05-07 ENCOUNTER — Telehealth: Payer: Medicare Other | Admitting: Neurology

## 2021-05-08 DIAGNOSIS — R2681 Unsteadiness on feet: Secondary | ICD-10-CM | POA: Diagnosis not present

## 2021-05-08 DIAGNOSIS — R278 Other lack of coordination: Secondary | ICD-10-CM | POA: Diagnosis not present

## 2021-05-08 DIAGNOSIS — R41841 Cognitive communication deficit: Secondary | ICD-10-CM | POA: Diagnosis not present

## 2021-05-08 DIAGNOSIS — G20 Parkinson's disease: Secondary | ICD-10-CM | POA: Diagnosis not present

## 2021-05-08 DIAGNOSIS — M6281 Muscle weakness (generalized): Secondary | ICD-10-CM | POA: Diagnosis not present

## 2021-05-08 DIAGNOSIS — R2689 Other abnormalities of gait and mobility: Secondary | ICD-10-CM | POA: Diagnosis not present

## 2021-05-09 DIAGNOSIS — R2689 Other abnormalities of gait and mobility: Secondary | ICD-10-CM | POA: Diagnosis not present

## 2021-05-09 DIAGNOSIS — G20 Parkinson's disease: Secondary | ICD-10-CM | POA: Diagnosis not present

## 2021-05-09 DIAGNOSIS — M6281 Muscle weakness (generalized): Secondary | ICD-10-CM | POA: Diagnosis not present

## 2021-05-09 DIAGNOSIS — R2681 Unsteadiness on feet: Secondary | ICD-10-CM | POA: Diagnosis not present

## 2021-05-09 DIAGNOSIS — R278 Other lack of coordination: Secondary | ICD-10-CM | POA: Diagnosis not present

## 2021-05-09 DIAGNOSIS — R41841 Cognitive communication deficit: Secondary | ICD-10-CM | POA: Diagnosis not present

## 2021-05-10 DIAGNOSIS — R2681 Unsteadiness on feet: Secondary | ICD-10-CM | POA: Diagnosis not present

## 2021-05-10 DIAGNOSIS — M6281 Muscle weakness (generalized): Secondary | ICD-10-CM | POA: Diagnosis not present

## 2021-05-10 DIAGNOSIS — R41841 Cognitive communication deficit: Secondary | ICD-10-CM | POA: Diagnosis not present

## 2021-05-10 DIAGNOSIS — R2689 Other abnormalities of gait and mobility: Secondary | ICD-10-CM | POA: Diagnosis not present

## 2021-05-10 DIAGNOSIS — G20 Parkinson's disease: Secondary | ICD-10-CM | POA: Diagnosis not present

## 2021-05-10 DIAGNOSIS — R278 Other lack of coordination: Secondary | ICD-10-CM | POA: Diagnosis not present

## 2021-05-13 DIAGNOSIS — Z8551 Personal history of malignant neoplasm of bladder: Secondary | ICD-10-CM | POA: Diagnosis not present

## 2021-05-13 DIAGNOSIS — R2689 Other abnormalities of gait and mobility: Secondary | ICD-10-CM | POA: Diagnosis not present

## 2021-05-13 DIAGNOSIS — I10 Essential (primary) hypertension: Secondary | ICD-10-CM | POA: Diagnosis not present

## 2021-05-13 DIAGNOSIS — G20 Parkinson's disease: Secondary | ICD-10-CM | POA: Diagnosis not present

## 2021-05-13 DIAGNOSIS — I872 Venous insufficiency (chronic) (peripheral): Secondary | ICD-10-CM | POA: Diagnosis not present

## 2021-05-13 DIAGNOSIS — I2510 Atherosclerotic heart disease of native coronary artery without angina pectoris: Secondary | ICD-10-CM | POA: Diagnosis not present

## 2021-05-13 DIAGNOSIS — Z1389 Encounter for screening for other disorder: Secondary | ICD-10-CM | POA: Diagnosis not present

## 2021-05-13 DIAGNOSIS — R41841 Cognitive communication deficit: Secondary | ICD-10-CM | POA: Diagnosis not present

## 2021-05-13 DIAGNOSIS — E7800 Pure hypercholesterolemia, unspecified: Secondary | ICD-10-CM | POA: Diagnosis not present

## 2021-05-13 DIAGNOSIS — F0390 Unspecified dementia without behavioral disturbance: Secondary | ICD-10-CM | POA: Diagnosis not present

## 2021-05-13 DIAGNOSIS — R278 Other lack of coordination: Secondary | ICD-10-CM | POA: Diagnosis not present

## 2021-05-13 DIAGNOSIS — C4490 Unspecified malignant neoplasm of skin, unspecified: Secondary | ICD-10-CM | POA: Diagnosis not present

## 2021-05-13 DIAGNOSIS — Z0000 Encounter for general adult medical examination without abnormal findings: Secondary | ICD-10-CM | POA: Diagnosis not present

## 2021-05-13 DIAGNOSIS — M6281 Muscle weakness (generalized): Secondary | ICD-10-CM | POA: Diagnosis not present

## 2021-05-13 DIAGNOSIS — R2681 Unsteadiness on feet: Secondary | ICD-10-CM | POA: Diagnosis not present

## 2021-05-13 DIAGNOSIS — H547 Unspecified visual loss: Secondary | ICD-10-CM | POA: Diagnosis not present

## 2021-05-13 DIAGNOSIS — K219 Gastro-esophageal reflux disease without esophagitis: Secondary | ICD-10-CM | POA: Diagnosis not present

## 2021-05-14 DIAGNOSIS — R41841 Cognitive communication deficit: Secondary | ICD-10-CM | POA: Diagnosis not present

## 2021-05-14 DIAGNOSIS — R2689 Other abnormalities of gait and mobility: Secondary | ICD-10-CM | POA: Diagnosis not present

## 2021-05-14 DIAGNOSIS — R278 Other lack of coordination: Secondary | ICD-10-CM | POA: Diagnosis not present

## 2021-05-14 DIAGNOSIS — M6281 Muscle weakness (generalized): Secondary | ICD-10-CM | POA: Diagnosis not present

## 2021-05-14 DIAGNOSIS — G20 Parkinson's disease: Secondary | ICD-10-CM | POA: Diagnosis not present

## 2021-05-14 DIAGNOSIS — R2681 Unsteadiness on feet: Secondary | ICD-10-CM | POA: Diagnosis not present

## 2021-05-15 DIAGNOSIS — R2689 Other abnormalities of gait and mobility: Secondary | ICD-10-CM | POA: Diagnosis not present

## 2021-05-15 DIAGNOSIS — M6281 Muscle weakness (generalized): Secondary | ICD-10-CM | POA: Diagnosis not present

## 2021-05-15 DIAGNOSIS — R278 Other lack of coordination: Secondary | ICD-10-CM | POA: Diagnosis not present

## 2021-05-15 DIAGNOSIS — R2681 Unsteadiness on feet: Secondary | ICD-10-CM | POA: Diagnosis not present

## 2021-05-15 DIAGNOSIS — R41841 Cognitive communication deficit: Secondary | ICD-10-CM | POA: Diagnosis not present

## 2021-05-15 DIAGNOSIS — G20 Parkinson's disease: Secondary | ICD-10-CM | POA: Diagnosis not present

## 2021-05-16 DIAGNOSIS — G20 Parkinson's disease: Secondary | ICD-10-CM | POA: Diagnosis not present

## 2021-05-16 DIAGNOSIS — R278 Other lack of coordination: Secondary | ICD-10-CM | POA: Diagnosis not present

## 2021-05-16 DIAGNOSIS — R2689 Other abnormalities of gait and mobility: Secondary | ICD-10-CM | POA: Diagnosis not present

## 2021-05-16 DIAGNOSIS — M6281 Muscle weakness (generalized): Secondary | ICD-10-CM | POA: Diagnosis not present

## 2021-05-16 DIAGNOSIS — R41841 Cognitive communication deficit: Secondary | ICD-10-CM | POA: Diagnosis not present

## 2021-05-16 DIAGNOSIS — R2681 Unsteadiness on feet: Secondary | ICD-10-CM | POA: Diagnosis not present

## 2021-05-17 DIAGNOSIS — M6281 Muscle weakness (generalized): Secondary | ICD-10-CM | POA: Diagnosis not present

## 2021-05-17 DIAGNOSIS — N3941 Urge incontinence: Secondary | ICD-10-CM | POA: Diagnosis not present

## 2021-05-17 DIAGNOSIS — R2681 Unsteadiness on feet: Secondary | ICD-10-CM | POA: Diagnosis not present

## 2021-05-17 DIAGNOSIS — G20 Parkinson's disease: Secondary | ICD-10-CM | POA: Diagnosis not present

## 2021-05-17 DIAGNOSIS — R488 Other symbolic dysfunctions: Secondary | ICD-10-CM | POA: Diagnosis not present

## 2021-05-17 DIAGNOSIS — R2689 Other abnormalities of gait and mobility: Secondary | ICD-10-CM | POA: Diagnosis not present

## 2021-05-17 DIAGNOSIS — R41841 Cognitive communication deficit: Secondary | ICD-10-CM | POA: Diagnosis not present

## 2021-05-17 DIAGNOSIS — R278 Other lack of coordination: Secondary | ICD-10-CM | POA: Diagnosis not present

## 2021-05-20 DIAGNOSIS — Z1159 Encounter for screening for other viral diseases: Secondary | ICD-10-CM | POA: Diagnosis not present

## 2021-05-20 DIAGNOSIS — Z20828 Contact with and (suspected) exposure to other viral communicable diseases: Secondary | ICD-10-CM | POA: Diagnosis not present

## 2021-05-21 DIAGNOSIS — R278 Other lack of coordination: Secondary | ICD-10-CM | POA: Diagnosis not present

## 2021-05-21 DIAGNOSIS — R2681 Unsteadiness on feet: Secondary | ICD-10-CM | POA: Diagnosis not present

## 2021-05-21 DIAGNOSIS — M6281 Muscle weakness (generalized): Secondary | ICD-10-CM | POA: Diagnosis not present

## 2021-05-21 DIAGNOSIS — R41841 Cognitive communication deficit: Secondary | ICD-10-CM | POA: Diagnosis not present

## 2021-05-21 DIAGNOSIS — N3941 Urge incontinence: Secondary | ICD-10-CM | POA: Diagnosis not present

## 2021-05-21 DIAGNOSIS — R2689 Other abnormalities of gait and mobility: Secondary | ICD-10-CM | POA: Diagnosis not present

## 2021-05-22 DIAGNOSIS — M6281 Muscle weakness (generalized): Secondary | ICD-10-CM | POA: Diagnosis not present

## 2021-05-22 DIAGNOSIS — R41841 Cognitive communication deficit: Secondary | ICD-10-CM | POA: Diagnosis not present

## 2021-05-22 DIAGNOSIS — N3941 Urge incontinence: Secondary | ICD-10-CM | POA: Diagnosis not present

## 2021-05-22 DIAGNOSIS — R2689 Other abnormalities of gait and mobility: Secondary | ICD-10-CM | POA: Diagnosis not present

## 2021-05-22 DIAGNOSIS — R278 Other lack of coordination: Secondary | ICD-10-CM | POA: Diagnosis not present

## 2021-05-22 DIAGNOSIS — R2681 Unsteadiness on feet: Secondary | ICD-10-CM | POA: Diagnosis not present

## 2021-05-23 DIAGNOSIS — M6281 Muscle weakness (generalized): Secondary | ICD-10-CM | POA: Diagnosis not present

## 2021-05-23 DIAGNOSIS — R2689 Other abnormalities of gait and mobility: Secondary | ICD-10-CM | POA: Diagnosis not present

## 2021-05-23 DIAGNOSIS — R2681 Unsteadiness on feet: Secondary | ICD-10-CM | POA: Diagnosis not present

## 2021-05-23 DIAGNOSIS — N3941 Urge incontinence: Secondary | ICD-10-CM | POA: Diagnosis not present

## 2021-05-23 DIAGNOSIS — R278 Other lack of coordination: Secondary | ICD-10-CM | POA: Diagnosis not present

## 2021-05-23 DIAGNOSIS — R41841 Cognitive communication deficit: Secondary | ICD-10-CM | POA: Diagnosis not present

## 2021-05-24 DIAGNOSIS — R2689 Other abnormalities of gait and mobility: Secondary | ICD-10-CM | POA: Diagnosis not present

## 2021-05-24 DIAGNOSIS — N3941 Urge incontinence: Secondary | ICD-10-CM | POA: Diagnosis not present

## 2021-05-24 DIAGNOSIS — M6281 Muscle weakness (generalized): Secondary | ICD-10-CM | POA: Diagnosis not present

## 2021-05-24 DIAGNOSIS — R2681 Unsteadiness on feet: Secondary | ICD-10-CM | POA: Diagnosis not present

## 2021-05-24 DIAGNOSIS — R278 Other lack of coordination: Secondary | ICD-10-CM | POA: Diagnosis not present

## 2021-05-24 DIAGNOSIS — R41841 Cognitive communication deficit: Secondary | ICD-10-CM | POA: Diagnosis not present

## 2021-05-27 DIAGNOSIS — Z1159 Encounter for screening for other viral diseases: Secondary | ICD-10-CM | POA: Diagnosis not present

## 2021-05-27 DIAGNOSIS — M6281 Muscle weakness (generalized): Secondary | ICD-10-CM | POA: Diagnosis not present

## 2021-05-27 DIAGNOSIS — N3941 Urge incontinence: Secondary | ICD-10-CM | POA: Diagnosis not present

## 2021-05-27 DIAGNOSIS — R41841 Cognitive communication deficit: Secondary | ICD-10-CM | POA: Diagnosis not present

## 2021-05-27 DIAGNOSIS — R2681 Unsteadiness on feet: Secondary | ICD-10-CM | POA: Diagnosis not present

## 2021-05-27 DIAGNOSIS — R278 Other lack of coordination: Secondary | ICD-10-CM | POA: Diagnosis not present

## 2021-05-27 DIAGNOSIS — Z20828 Contact with and (suspected) exposure to other viral communicable diseases: Secondary | ICD-10-CM | POA: Diagnosis not present

## 2021-05-27 DIAGNOSIS — R2689 Other abnormalities of gait and mobility: Secondary | ICD-10-CM | POA: Diagnosis not present

## 2021-05-28 DIAGNOSIS — R2689 Other abnormalities of gait and mobility: Secondary | ICD-10-CM | POA: Diagnosis not present

## 2021-05-28 DIAGNOSIS — N3941 Urge incontinence: Secondary | ICD-10-CM | POA: Diagnosis not present

## 2021-05-28 DIAGNOSIS — R278 Other lack of coordination: Secondary | ICD-10-CM | POA: Diagnosis not present

## 2021-05-28 DIAGNOSIS — M6281 Muscle weakness (generalized): Secondary | ICD-10-CM | POA: Diagnosis not present

## 2021-05-28 DIAGNOSIS — R2681 Unsteadiness on feet: Secondary | ICD-10-CM | POA: Diagnosis not present

## 2021-05-28 DIAGNOSIS — R41841 Cognitive communication deficit: Secondary | ICD-10-CM | POA: Diagnosis not present

## 2021-05-29 DIAGNOSIS — R41841 Cognitive communication deficit: Secondary | ICD-10-CM | POA: Diagnosis not present

## 2021-05-29 DIAGNOSIS — R278 Other lack of coordination: Secondary | ICD-10-CM | POA: Diagnosis not present

## 2021-05-29 DIAGNOSIS — N3941 Urge incontinence: Secondary | ICD-10-CM | POA: Diagnosis not present

## 2021-05-29 DIAGNOSIS — R2681 Unsteadiness on feet: Secondary | ICD-10-CM | POA: Diagnosis not present

## 2021-05-29 DIAGNOSIS — R2689 Other abnormalities of gait and mobility: Secondary | ICD-10-CM | POA: Diagnosis not present

## 2021-05-29 DIAGNOSIS — M6281 Muscle weakness (generalized): Secondary | ICD-10-CM | POA: Diagnosis not present

## 2021-05-30 DIAGNOSIS — M6281 Muscle weakness (generalized): Secondary | ICD-10-CM | POA: Diagnosis not present

## 2021-05-30 DIAGNOSIS — R2681 Unsteadiness on feet: Secondary | ICD-10-CM | POA: Diagnosis not present

## 2021-05-30 DIAGNOSIS — R278 Other lack of coordination: Secondary | ICD-10-CM | POA: Diagnosis not present

## 2021-05-30 DIAGNOSIS — N3941 Urge incontinence: Secondary | ICD-10-CM | POA: Diagnosis not present

## 2021-05-30 DIAGNOSIS — R41841 Cognitive communication deficit: Secondary | ICD-10-CM | POA: Diagnosis not present

## 2021-05-30 DIAGNOSIS — R2689 Other abnormalities of gait and mobility: Secondary | ICD-10-CM | POA: Diagnosis not present

## 2021-05-31 DIAGNOSIS — N3941 Urge incontinence: Secondary | ICD-10-CM | POA: Diagnosis not present

## 2021-05-31 DIAGNOSIS — R2681 Unsteadiness on feet: Secondary | ICD-10-CM | POA: Diagnosis not present

## 2021-05-31 DIAGNOSIS — R278 Other lack of coordination: Secondary | ICD-10-CM | POA: Diagnosis not present

## 2021-05-31 DIAGNOSIS — M6281 Muscle weakness (generalized): Secondary | ICD-10-CM | POA: Diagnosis not present

## 2021-05-31 DIAGNOSIS — R2689 Other abnormalities of gait and mobility: Secondary | ICD-10-CM | POA: Diagnosis not present

## 2021-05-31 DIAGNOSIS — R41841 Cognitive communication deficit: Secondary | ICD-10-CM | POA: Diagnosis not present

## 2021-06-03 DIAGNOSIS — Z20828 Contact with and (suspected) exposure to other viral communicable diseases: Secondary | ICD-10-CM | POA: Diagnosis not present

## 2021-06-03 DIAGNOSIS — R41841 Cognitive communication deficit: Secondary | ICD-10-CM | POA: Diagnosis not present

## 2021-06-03 DIAGNOSIS — M6281 Muscle weakness (generalized): Secondary | ICD-10-CM | POA: Diagnosis not present

## 2021-06-03 DIAGNOSIS — R2681 Unsteadiness on feet: Secondary | ICD-10-CM | POA: Diagnosis not present

## 2021-06-03 DIAGNOSIS — Z1159 Encounter for screening for other viral diseases: Secondary | ICD-10-CM | POA: Diagnosis not present

## 2021-06-03 DIAGNOSIS — N3941 Urge incontinence: Secondary | ICD-10-CM | POA: Diagnosis not present

## 2021-06-03 DIAGNOSIS — R2689 Other abnormalities of gait and mobility: Secondary | ICD-10-CM | POA: Diagnosis not present

## 2021-06-03 DIAGNOSIS — R278 Other lack of coordination: Secondary | ICD-10-CM | POA: Diagnosis not present

## 2021-06-04 DIAGNOSIS — R2689 Other abnormalities of gait and mobility: Secondary | ICD-10-CM | POA: Diagnosis not present

## 2021-06-04 DIAGNOSIS — N3941 Urge incontinence: Secondary | ICD-10-CM | POA: Diagnosis not present

## 2021-06-04 DIAGNOSIS — R278 Other lack of coordination: Secondary | ICD-10-CM | POA: Diagnosis not present

## 2021-06-04 DIAGNOSIS — R2681 Unsteadiness on feet: Secondary | ICD-10-CM | POA: Diagnosis not present

## 2021-06-04 DIAGNOSIS — M6281 Muscle weakness (generalized): Secondary | ICD-10-CM | POA: Diagnosis not present

## 2021-06-04 DIAGNOSIS — R41841 Cognitive communication deficit: Secondary | ICD-10-CM | POA: Diagnosis not present

## 2021-06-05 DIAGNOSIS — R278 Other lack of coordination: Secondary | ICD-10-CM | POA: Diagnosis not present

## 2021-06-05 DIAGNOSIS — N3941 Urge incontinence: Secondary | ICD-10-CM | POA: Diagnosis not present

## 2021-06-05 DIAGNOSIS — R2681 Unsteadiness on feet: Secondary | ICD-10-CM | POA: Diagnosis not present

## 2021-06-05 DIAGNOSIS — R41841 Cognitive communication deficit: Secondary | ICD-10-CM | POA: Diagnosis not present

## 2021-06-05 DIAGNOSIS — M6281 Muscle weakness (generalized): Secondary | ICD-10-CM | POA: Diagnosis not present

## 2021-06-05 DIAGNOSIS — R2689 Other abnormalities of gait and mobility: Secondary | ICD-10-CM | POA: Diagnosis not present

## 2021-06-06 DIAGNOSIS — M6281 Muscle weakness (generalized): Secondary | ICD-10-CM | POA: Diagnosis not present

## 2021-06-06 DIAGNOSIS — R41841 Cognitive communication deficit: Secondary | ICD-10-CM | POA: Diagnosis not present

## 2021-06-06 DIAGNOSIS — R2689 Other abnormalities of gait and mobility: Secondary | ICD-10-CM | POA: Diagnosis not present

## 2021-06-06 DIAGNOSIS — N3941 Urge incontinence: Secondary | ICD-10-CM | POA: Diagnosis not present

## 2021-06-06 DIAGNOSIS — R2681 Unsteadiness on feet: Secondary | ICD-10-CM | POA: Diagnosis not present

## 2021-06-06 DIAGNOSIS — R278 Other lack of coordination: Secondary | ICD-10-CM | POA: Diagnosis not present

## 2021-06-10 DIAGNOSIS — N3941 Urge incontinence: Secondary | ICD-10-CM | POA: Diagnosis not present

## 2021-06-10 DIAGNOSIS — R2689 Other abnormalities of gait and mobility: Secondary | ICD-10-CM | POA: Diagnosis not present

## 2021-06-10 DIAGNOSIS — Z20828 Contact with and (suspected) exposure to other viral communicable diseases: Secondary | ICD-10-CM | POA: Diagnosis not present

## 2021-06-10 DIAGNOSIS — R278 Other lack of coordination: Secondary | ICD-10-CM | POA: Diagnosis not present

## 2021-06-10 DIAGNOSIS — R41841 Cognitive communication deficit: Secondary | ICD-10-CM | POA: Diagnosis not present

## 2021-06-10 DIAGNOSIS — R2681 Unsteadiness on feet: Secondary | ICD-10-CM | POA: Diagnosis not present

## 2021-06-10 DIAGNOSIS — Z1159 Encounter for screening for other viral diseases: Secondary | ICD-10-CM | POA: Diagnosis not present

## 2021-06-10 DIAGNOSIS — M6281 Muscle weakness (generalized): Secondary | ICD-10-CM | POA: Diagnosis not present

## 2021-06-12 DIAGNOSIS — R2689 Other abnormalities of gait and mobility: Secondary | ICD-10-CM | POA: Diagnosis not present

## 2021-06-12 DIAGNOSIS — M6281 Muscle weakness (generalized): Secondary | ICD-10-CM | POA: Diagnosis not present

## 2021-06-12 DIAGNOSIS — R2681 Unsteadiness on feet: Secondary | ICD-10-CM | POA: Diagnosis not present

## 2021-06-12 DIAGNOSIS — R278 Other lack of coordination: Secondary | ICD-10-CM | POA: Diagnosis not present

## 2021-06-12 DIAGNOSIS — N3941 Urge incontinence: Secondary | ICD-10-CM | POA: Diagnosis not present

## 2021-06-12 DIAGNOSIS — R41841 Cognitive communication deficit: Secondary | ICD-10-CM | POA: Diagnosis not present

## 2021-06-13 DIAGNOSIS — R41841 Cognitive communication deficit: Secondary | ICD-10-CM | POA: Diagnosis not present

## 2021-06-13 DIAGNOSIS — M6281 Muscle weakness (generalized): Secondary | ICD-10-CM | POA: Diagnosis not present

## 2021-06-13 DIAGNOSIS — R2681 Unsteadiness on feet: Secondary | ICD-10-CM | POA: Diagnosis not present

## 2021-06-13 DIAGNOSIS — R2689 Other abnormalities of gait and mobility: Secondary | ICD-10-CM | POA: Diagnosis not present

## 2021-06-13 DIAGNOSIS — R278 Other lack of coordination: Secondary | ICD-10-CM | POA: Diagnosis not present

## 2021-06-13 DIAGNOSIS — N3941 Urge incontinence: Secondary | ICD-10-CM | POA: Diagnosis not present

## 2021-06-14 DIAGNOSIS — N3941 Urge incontinence: Secondary | ICD-10-CM | POA: Diagnosis not present

## 2021-06-14 DIAGNOSIS — R41841 Cognitive communication deficit: Secondary | ICD-10-CM | POA: Diagnosis not present

## 2021-06-14 DIAGNOSIS — R2689 Other abnormalities of gait and mobility: Secondary | ICD-10-CM | POA: Diagnosis not present

## 2021-06-14 DIAGNOSIS — R278 Other lack of coordination: Secondary | ICD-10-CM | POA: Diagnosis not present

## 2021-06-14 DIAGNOSIS — M6281 Muscle weakness (generalized): Secondary | ICD-10-CM | POA: Diagnosis not present

## 2021-06-14 DIAGNOSIS — R2681 Unsteadiness on feet: Secondary | ICD-10-CM | POA: Diagnosis not present

## 2021-06-17 DIAGNOSIS — R41841 Cognitive communication deficit: Secondary | ICD-10-CM | POA: Diagnosis not present

## 2021-06-17 DIAGNOSIS — R488 Other symbolic dysfunctions: Secondary | ICD-10-CM | POA: Diagnosis not present

## 2021-06-17 DIAGNOSIS — Z1159 Encounter for screening for other viral diseases: Secondary | ICD-10-CM | POA: Diagnosis not present

## 2021-06-17 DIAGNOSIS — M6281 Muscle weakness (generalized): Secondary | ICD-10-CM | POA: Diagnosis not present

## 2021-06-17 DIAGNOSIS — N3941 Urge incontinence: Secondary | ICD-10-CM | POA: Diagnosis not present

## 2021-06-17 DIAGNOSIS — Z20828 Contact with and (suspected) exposure to other viral communicable diseases: Secondary | ICD-10-CM | POA: Diagnosis not present

## 2021-06-17 DIAGNOSIS — R278 Other lack of coordination: Secondary | ICD-10-CM | POA: Diagnosis not present

## 2021-06-18 DIAGNOSIS — R488 Other symbolic dysfunctions: Secondary | ICD-10-CM | POA: Diagnosis not present

## 2021-06-18 DIAGNOSIS — N3941 Urge incontinence: Secondary | ICD-10-CM | POA: Diagnosis not present

## 2021-06-18 DIAGNOSIS — R41841 Cognitive communication deficit: Secondary | ICD-10-CM | POA: Diagnosis not present

## 2021-06-18 DIAGNOSIS — M6281 Muscle weakness (generalized): Secondary | ICD-10-CM | POA: Diagnosis not present

## 2021-06-18 DIAGNOSIS — R278 Other lack of coordination: Secondary | ICD-10-CM | POA: Diagnosis not present

## 2021-06-19 DIAGNOSIS — R488 Other symbolic dysfunctions: Secondary | ICD-10-CM | POA: Diagnosis not present

## 2021-06-19 DIAGNOSIS — R278 Other lack of coordination: Secondary | ICD-10-CM | POA: Diagnosis not present

## 2021-06-19 DIAGNOSIS — N3941 Urge incontinence: Secondary | ICD-10-CM | POA: Diagnosis not present

## 2021-06-19 DIAGNOSIS — M6281 Muscle weakness (generalized): Secondary | ICD-10-CM | POA: Diagnosis not present

## 2021-06-19 DIAGNOSIS — R41841 Cognitive communication deficit: Secondary | ICD-10-CM | POA: Diagnosis not present

## 2021-06-20 DIAGNOSIS — N3941 Urge incontinence: Secondary | ICD-10-CM | POA: Diagnosis not present

## 2021-06-20 DIAGNOSIS — M6281 Muscle weakness (generalized): Secondary | ICD-10-CM | POA: Diagnosis not present

## 2021-06-20 DIAGNOSIS — R278 Other lack of coordination: Secondary | ICD-10-CM | POA: Diagnosis not present

## 2021-06-20 DIAGNOSIS — R488 Other symbolic dysfunctions: Secondary | ICD-10-CM | POA: Diagnosis not present

## 2021-06-20 DIAGNOSIS — R41841 Cognitive communication deficit: Secondary | ICD-10-CM | POA: Diagnosis not present

## 2021-06-25 DIAGNOSIS — R278 Other lack of coordination: Secondary | ICD-10-CM | POA: Diagnosis not present

## 2021-06-25 DIAGNOSIS — N3941 Urge incontinence: Secondary | ICD-10-CM | POA: Diagnosis not present

## 2021-06-25 DIAGNOSIS — R41841 Cognitive communication deficit: Secondary | ICD-10-CM | POA: Diagnosis not present

## 2021-06-25 DIAGNOSIS — M6281 Muscle weakness (generalized): Secondary | ICD-10-CM | POA: Diagnosis not present

## 2021-06-25 DIAGNOSIS — R488 Other symbolic dysfunctions: Secondary | ICD-10-CM | POA: Diagnosis not present

## 2021-06-26 DIAGNOSIS — R278 Other lack of coordination: Secondary | ICD-10-CM | POA: Diagnosis not present

## 2021-06-26 DIAGNOSIS — R41841 Cognitive communication deficit: Secondary | ICD-10-CM | POA: Diagnosis not present

## 2021-06-26 DIAGNOSIS — R488 Other symbolic dysfunctions: Secondary | ICD-10-CM | POA: Diagnosis not present

## 2021-06-26 DIAGNOSIS — N3941 Urge incontinence: Secondary | ICD-10-CM | POA: Diagnosis not present

## 2021-06-26 DIAGNOSIS — M6281 Muscle weakness (generalized): Secondary | ICD-10-CM | POA: Diagnosis not present

## 2021-07-01 DIAGNOSIS — N3941 Urge incontinence: Secondary | ICD-10-CM | POA: Diagnosis not present

## 2021-07-01 DIAGNOSIS — R488 Other symbolic dysfunctions: Secondary | ICD-10-CM | POA: Diagnosis not present

## 2021-07-01 DIAGNOSIS — M6281 Muscle weakness (generalized): Secondary | ICD-10-CM | POA: Diagnosis not present

## 2021-07-01 DIAGNOSIS — R278 Other lack of coordination: Secondary | ICD-10-CM | POA: Diagnosis not present

## 2021-07-01 DIAGNOSIS — R41841 Cognitive communication deficit: Secondary | ICD-10-CM | POA: Diagnosis not present

## 2021-07-03 DIAGNOSIS — N3941 Urge incontinence: Secondary | ICD-10-CM | POA: Diagnosis not present

## 2021-07-03 DIAGNOSIS — M6281 Muscle weakness (generalized): Secondary | ICD-10-CM | POA: Diagnosis not present

## 2021-07-03 DIAGNOSIS — R488 Other symbolic dysfunctions: Secondary | ICD-10-CM | POA: Diagnosis not present

## 2021-07-03 DIAGNOSIS — R41841 Cognitive communication deficit: Secondary | ICD-10-CM | POA: Diagnosis not present

## 2021-07-03 DIAGNOSIS — R278 Other lack of coordination: Secondary | ICD-10-CM | POA: Diagnosis not present

## 2021-07-04 DIAGNOSIS — N3941 Urge incontinence: Secondary | ICD-10-CM | POA: Diagnosis not present

## 2021-07-04 DIAGNOSIS — R488 Other symbolic dysfunctions: Secondary | ICD-10-CM | POA: Diagnosis not present

## 2021-07-04 DIAGNOSIS — R278 Other lack of coordination: Secondary | ICD-10-CM | POA: Diagnosis not present

## 2021-07-04 DIAGNOSIS — R41841 Cognitive communication deficit: Secondary | ICD-10-CM | POA: Diagnosis not present

## 2021-07-04 DIAGNOSIS — M6281 Muscle weakness (generalized): Secondary | ICD-10-CM | POA: Diagnosis not present

## 2021-07-08 DIAGNOSIS — R278 Other lack of coordination: Secondary | ICD-10-CM | POA: Diagnosis not present

## 2021-07-08 DIAGNOSIS — N3941 Urge incontinence: Secondary | ICD-10-CM | POA: Diagnosis not present

## 2021-07-08 DIAGNOSIS — R488 Other symbolic dysfunctions: Secondary | ICD-10-CM | POA: Diagnosis not present

## 2021-07-08 DIAGNOSIS — R41841 Cognitive communication deficit: Secondary | ICD-10-CM | POA: Diagnosis not present

## 2021-07-08 DIAGNOSIS — M6281 Muscle weakness (generalized): Secondary | ICD-10-CM | POA: Diagnosis not present

## 2021-07-10 DIAGNOSIS — R278 Other lack of coordination: Secondary | ICD-10-CM | POA: Diagnosis not present

## 2021-07-10 DIAGNOSIS — R488 Other symbolic dysfunctions: Secondary | ICD-10-CM | POA: Diagnosis not present

## 2021-07-10 DIAGNOSIS — M6281 Muscle weakness (generalized): Secondary | ICD-10-CM | POA: Diagnosis not present

## 2021-07-10 DIAGNOSIS — N3941 Urge incontinence: Secondary | ICD-10-CM | POA: Diagnosis not present

## 2021-07-10 DIAGNOSIS — R41841 Cognitive communication deficit: Secondary | ICD-10-CM | POA: Diagnosis not present

## 2021-07-11 DIAGNOSIS — R41841 Cognitive communication deficit: Secondary | ICD-10-CM | POA: Diagnosis not present

## 2021-07-11 DIAGNOSIS — M6281 Muscle weakness (generalized): Secondary | ICD-10-CM | POA: Diagnosis not present

## 2021-07-11 DIAGNOSIS — N3941 Urge incontinence: Secondary | ICD-10-CM | POA: Diagnosis not present

## 2021-07-11 DIAGNOSIS — R488 Other symbolic dysfunctions: Secondary | ICD-10-CM | POA: Diagnosis not present

## 2021-07-11 DIAGNOSIS — R278 Other lack of coordination: Secondary | ICD-10-CM | POA: Diagnosis not present

## 2021-07-15 ENCOUNTER — Emergency Department (HOSPITAL_COMMUNITY): Payer: Medicare Other

## 2021-07-15 ENCOUNTER — Emergency Department (HOSPITAL_COMMUNITY)
Admission: EM | Admit: 2021-07-15 | Discharge: 2021-07-15 | Disposition: A | Discharge status: Skilled Nursing Facility | Payer: Medicare Other | Attending: Emergency Medicine | Admitting: Emergency Medicine

## 2021-07-15 ENCOUNTER — Encounter (HOSPITAL_COMMUNITY): Payer: Self-pay

## 2021-07-15 DIAGNOSIS — J45909 Unspecified asthma, uncomplicated: Secondary | ICD-10-CM | POA: Diagnosis not present

## 2021-07-15 DIAGNOSIS — M79601 Pain in right arm: Secondary | ICD-10-CM | POA: Insufficient documentation

## 2021-07-15 DIAGNOSIS — G20 Parkinson's disease: Secondary | ICD-10-CM | POA: Insufficient documentation

## 2021-07-15 DIAGNOSIS — I119 Hypertensive heart disease without heart failure: Secondary | ICD-10-CM | POA: Insufficient documentation

## 2021-07-15 DIAGNOSIS — Z7952 Long term (current) use of systemic steroids: Secondary | ICD-10-CM | POA: Insufficient documentation

## 2021-07-15 DIAGNOSIS — W19XXXA Unspecified fall, initial encounter: Secondary | ICD-10-CM

## 2021-07-15 DIAGNOSIS — Y92129 Unspecified place in nursing home as the place of occurrence of the external cause: Secondary | ICD-10-CM | POA: Insufficient documentation

## 2021-07-15 DIAGNOSIS — F0390 Unspecified dementia without behavioral disturbance: Secondary | ICD-10-CM | POA: Insufficient documentation

## 2021-07-15 DIAGNOSIS — Z8551 Personal history of malignant neoplasm of bladder: Secondary | ICD-10-CM | POA: Insufficient documentation

## 2021-07-15 DIAGNOSIS — M542 Cervicalgia: Secondary | ICD-10-CM | POA: Insufficient documentation

## 2021-07-15 DIAGNOSIS — Z7982 Long term (current) use of aspirin: Secondary | ICD-10-CM | POA: Diagnosis not present

## 2021-07-15 DIAGNOSIS — Z79899 Other long term (current) drug therapy: Secondary | ICD-10-CM | POA: Diagnosis not present

## 2021-07-15 DIAGNOSIS — Z043 Encounter for examination and observation following other accident: Secondary | ICD-10-CM | POA: Diagnosis not present

## 2021-07-15 DIAGNOSIS — S0990XA Unspecified injury of head, initial encounter: Secondary | ICD-10-CM | POA: Diagnosis not present

## 2021-07-15 DIAGNOSIS — W07XXXA Fall from chair, initial encounter: Secondary | ICD-10-CM | POA: Diagnosis not present

## 2021-07-15 DIAGNOSIS — M79603 Pain in arm, unspecified: Secondary | ICD-10-CM | POA: Diagnosis not present

## 2021-07-15 DIAGNOSIS — S90931A Unspecified superficial injury of right great toe, initial encounter: Secondary | ICD-10-CM | POA: Diagnosis present

## 2021-07-15 DIAGNOSIS — S90111A Contusion of right great toe without damage to nail, initial encounter: Secondary | ICD-10-CM | POA: Diagnosis not present

## 2021-07-15 DIAGNOSIS — I2510 Atherosclerotic heart disease of native coronary artery without angina pectoris: Secondary | ICD-10-CM | POA: Insufficient documentation

## 2021-07-15 DIAGNOSIS — Z87891 Personal history of nicotine dependence: Secondary | ICD-10-CM | POA: Diagnosis not present

## 2021-07-15 DIAGNOSIS — R404 Transient alteration of awareness: Secondary | ICD-10-CM | POA: Diagnosis not present

## 2021-07-15 DIAGNOSIS — Z85828 Personal history of other malignant neoplasm of skin: Secondary | ICD-10-CM | POA: Diagnosis not present

## 2021-07-15 DIAGNOSIS — M25511 Pain in right shoulder: Secondary | ICD-10-CM | POA: Diagnosis not present

## 2021-07-15 DIAGNOSIS — Z7902 Long term (current) use of antithrombotics/antiplatelets: Secondary | ICD-10-CM | POA: Insufficient documentation

## 2021-07-15 DIAGNOSIS — I10 Essential (primary) hypertension: Secondary | ICD-10-CM | POA: Diagnosis not present

## 2021-07-15 DIAGNOSIS — R0902 Hypoxemia: Secondary | ICD-10-CM | POA: Diagnosis not present

## 2021-07-15 LAB — BASIC METABOLIC PANEL
Anion gap: 7 (ref 5–15)
BUN: 24 mg/dL — ABNORMAL HIGH (ref 8–23)
CO2: 27 mmol/L (ref 22–32)
Calcium: 9 mg/dL (ref 8.9–10.3)
Chloride: 106 mmol/L (ref 98–111)
Creatinine, Ser: 0.86 mg/dL (ref 0.61–1.24)
GFR, Estimated: >60 mL/min (ref >60)
Glucose, Bld: 97 mg/dL (ref 70–99)
Potassium: 3.9 mmol/L (ref 3.5–5.1)
Sodium: 140 mmol/L (ref 135–145)

## 2021-07-15 LAB — CBC
HCT: 39.2 % (ref 39.0–52.0)
Hemoglobin: 13.3 g/dL (ref 13.0–17.0)
MCH: 33.6 pg (ref 26.0–34.0)
MCHC: 33.9 g/dL (ref 30.0–36.0)
MCV: 99 fL (ref 80.0–100.0)
Platelets: 172 10*3/uL (ref 150–400)
RBC: 3.96 MIL/uL — ABNORMAL LOW (ref 4.22–5.81)
RDW: 12.2 % (ref 11.5–15.5)
WBC: 10.8 10*3/uL — ABNORMAL HIGH (ref 4.0–10.5)
nRBC: 0 % (ref 0.0–0.2)

## 2021-07-15 NOTE — Discharge Instructions (Addendum)
Continue your current medications.  Follow-up with your doctor to be rechecked

## 2021-07-15 NOTE — ED Triage Notes (Signed)
PT arrives via EMS from [place] at [facility]. Staff informed EMS that while moving him in his chair at the facility this morning his "legs gave out". Staff assisted him to the floor. C/o right sided neck pain and right arm pain. C- collar in place

## 2021-07-15 NOTE — ED Provider Notes (Signed)
[facility] DEPT Provider Note   CSN: [id number] Arrival date & time: 07/15/21  1013     History Chief Complaint  Patient presents with   [name] [name] is a 87 y.o. male.   Fall   Patient presented to the ED for evaluation after a fall at the nursing [facility].  Staff informed EMS that while the patient was moving in his chair at the facility his legs gave out and he was assisted to the floor.  Patient did start to complaining of some neck pain and right arm pain.  He was sent to the ED for further evaluation.  Patient denies any complaints to me.  He does recall sliding to the floor but states he did not get injured.  He denies any trouble with chest pain or shortness of breath.  No abdominal pain.  Past Medical History:  Diagnosis Date   Asthma    Bladder cancer ([place])    CAD (coronary artery disease)    a. s/p Cypher DES x 2 to prox LAD and Cypher DES x 1 to distal LAD 08/2009;  b. cath with residual D2 50% 10/05 and normal EF;  c. 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 ([place]) 08/25/2019   Parkinson's disease ([place]) 02/16/2018   Early dry stage nonexudative age-related macular degeneration of both eyes 07/21/2017   Hypertensive retinopathy of both eyes 07/21/2017   Pseudophakia, both eyes 07/21/2017   Rash 02/12/2017   Ankle swelling, right 02/12/2017   Apocrine hidrocystoma 01/05/2013   Essential hypertension, benign 06/16/2012   CAD  (coronary artery disease) 05/27/2011   Bladder cancer ([place]) 05/27/2011   Osteopenia 05/27/2011   Elevated prostate specific antigen (PSA) 03/28/2008   Hyperlipidemia 01/31/2008   Unspecified hyperplasia of prostate without urinary obstruction and other lower urinary tract symptoms (LUTS) 01/31/2008   Impotence of organic origin 01/31/2008   Asthma 09/28/2007   GERD 07/22/2007    Past Surgical History:  Procedure Laterality Date   BLADDER TUMOR EXCISION  2011, 2012, 2013   Dr Estill Dooms, [facility]   CATARACT EXTRACTION  2003   Bilateral, [facility]   Colonoscopy with polypectomy  2005   Dr. Verl Blalock   Coronary artery stenting  2005   Dr. Olevia Perches, 3 vessel   LITHOTRIPSY  2002   UPPER GASTROINTESTINAL ENDOSCOPY  2005   Esophageal reflux, hiatal hernia       Family History  Problem Relation Age of Onset   Hypertension Mother    Stroke Mother        in 59s   Heart attack Mother        24   Coronary artery disease Father        3s   Heart disease Paternal Grandmother        in 29s   Healthy Child    Asthma Brother    Diabetes Brother    Alcohol abuse Brother  Prostate cancer Maternal Grandfather     Social History   Tobacco Use   Smoking status: Former    Types: Cigarettes    Quit date: 11/17/1960    Years since quitting: 60.6   Smokeless tobacco: Never   Tobacco comments:    smoked 1948-1962, up to 1/2 ppd  Vaping Use   Vaping Use: Never used  Substance Use Topics   Alcohol use: Yes    Alcohol/week: 10.0 standard drinks    Types: 10 Glasses of wine per week    Comment: wine @ dinner   Drug use: No    Home Medications Prior to Admission medications   Medication Sig Start Date End Date Taking? Authorizing Provider  amLODipine (NORVASC) 10 MG tablet Take 1 tablet (10 mg total) by mouth daily. Patient not taking: Reported on 04/04/2021 01/07/18   Sherren Mocha, MD  aspirin 81 MG tablet Take 81 mg by mouth daily.   Patient not taking: Reported on  04/04/2021    [provider]  carbidopa-levodopa (SINEMET IR) 25-100 MG tablet Take 1.5 tablets by mouth 3 (three) times daily. 04/04/21   Tat, Eustace Quail, DO  cephALEXin (KEFLEX) 500 MG capsule Take 500 mg by mouth 2 (two) times daily. Patient not taking: Reported on 04/04/2021 06/18/20   [provider]  clobetasol cream (TEMOVATE) 0.05 % Apply topically 2 (two) times daily. Patient not taking: Reported on 04/04/2021 06/18/20   [provider]  clopidogrel (PLAVIX) 75 MG tablet Take 1 tablet (75 mg total) by mouth daily. Please make overdue appt with Dr. Burt Knack before anymore refills. 2nd attempt Patient not taking: Reported on 04/04/2021 02/21/20   Sherren Mocha, MD  donepezil (ARICEPT) 10 MG tablet Take 1 tablet (10 mg total) by mouth at bedtime. 04/04/21   Tat, Eustace Quail, DO  doxycycline (VIBRAMYCIN) 100 MG capsule Take 100 mg by mouth 2 (two) times daily. Patient not taking: Reported on 04/04/2021 09/04/20   [provider]  fluticasone (FLONASE) 50 MCG/ACT nasal spray Place 2 sprays into both nostrils daily. --- Office visit needed for further refills 12/09/16   Binnie Rail, MD  furosemide (LASIX) 20 MG tablet Take 20 mg by mouth daily. Patient not taking: Reported on 04/04/2021 07/18/20   [provider]  nitroGLYCERIN (NITROSTAT) 0.4 MG SL tablet Place 1 tablet (0.4 mg total) under the tongue every 5 (five) minutes as needed for chest pain. 03/27/16   Sherren Mocha, MD  pantoprazole (PROTONIX) 20 MG tablet Take 1 tablet by mouth daily. 03/17/17   [provider]  potassium chloride (KLOR-CON) 10 MEQ tablet Take 10 mEq by mouth daily. Patient not taking: Reported on 04/04/2021 07/13/20   [provider]  QUEtiapine (SEROQUEL) 25 MG tablet Take 0.5 tablets (12.5 mg total) by mouth at bedtime. Patient not taking: Reported on 04/04/2021 01/31/21   Tat, Eustace Quail, DO    Allergies    Hydromorphone hcl, Phenytoin, Zocor [simvastatin - high dose],  Crestor [rosuvastatin calcium], Hydromorphone hcl, and Statins  Review of Systems   Review of Systems  All other systems reviewed and are negative.  Physical Exam Updated Vital Signs BP (!) 155/84   Pulse 69   Temp 98.5 F (36.9 C) (Oral)   Resp 18   SpO2 99%   Physical Exam Vitals and nursing note reviewed.  Constitutional:      Appearance: He is well-developed.     Comments: Elderly, frail  HENT:     Head: Normocephalic and atraumatic.  Right Ear: External ear normal.     Left Ear: External ear normal.  Eyes:     General: No scleral icterus.       Right eye: No discharge.        Left eye: No discharge.     Conjunctiva/sclera: Conjunctivae normal.  Neck:     Trachea: No tracheal deviation.     Comments: C-collar in place Cardiovascular:     Rate and Rhythm: Normal rate and regular rhythm.  Pulmonary:     Effort: Pulmonary effort is normal. No respiratory distress.     Breath sounds: Normal breath sounds. No stridor. No wheezing or rales.  Abdominal:     General: Bowel sounds are normal. There is no distension.     Palpations: Abdomen is soft.     Tenderness: There is no abdominal tenderness. There is no guarding or rebound.  Musculoskeletal:        General: No tenderness or deformity.     Cervical back: Neck supple. No muscular tenderness.     Comments: Extremities actively placed through full range of motion without pain or discomfort, no focal areas of tenderness, slight bruise noted on right big toe, no pain or tenderness  Skin:    General: Skin is warm and dry.     Findings: No rash.  Neurological:     General: No focal deficit present.     Mental Status: He is alert.     Cranial Nerves: No cranial nerve deficit (no facial droop, extraocular movements intact, no slurred speech).     Sensory: No sensory deficit.     Motor: No abnormal muscle tone or seizure activity.     Coordination: Coordination normal.  Psychiatric:        Mood and Affect: Mood  normal.    ED Results / Procedures / Treatments   Labs (all labs ordered are listed, but only abnormal results are displayed) Labs Reviewed  CBC - Abnormal; Notable for the following components:      Result Value   WBC 10.8 (*)    RBC 3.96 (*)    All other components within normal limits  BASIC METABOLIC PANEL - Abnormal; Notable for the following components:   BUN 24 (*)    All other components within normal limits    EKG EKG Interpretation  Date/Time:  Monday July 15 2021 11:14:01 EDT Ventricular Rate:  60 PR Interval:  266 QRS Duration: 90 QT Interval:  430 QTC Calculation: 430 R Axis:   37 Text Interpretation: Sinus rhythm with 1st degree A-V block Otherwise normal ECG first degree av block new since last tracing Confirmed by Dorie Rank [phone]) on 07/15/2021 11:18:35 AM  Radiology DG Shoulder Right  Result Date: 07/15/2021 CLINICAL DATA:  Pain EXAM: RIGHT SHOULDER - 2+ VIEW COMPARISON:  None. FINDINGS: There is no acute fracture or dislocation. Alignment is normal. There is mild acromioclavicular and glenohumeral joint space narrowing with associated subchondral sclerosis and mild osteophytosis consistent with osteoarthritis. There is slight irregularity of the greater tuberosity suggesting rotator cuff pathology. IMPRESSION: 1. No acute fracture or dislocation. 2. Degenerative changes of the acromioclavicular and glenohumeral joints as above. 3. Irregularity of the greater tuberosity suggesting rotator cuff pathology. Electronically Signed   By: Valetta Mole M.D.   On: 07/15/2021 11:17   CT HEAD WO CONTRAST (5MM)  Result Date: 07/15/2021 CLINICAL DATA:  Head trauma EXAM: CT HEAD WITHOUT CONTRAST TECHNIQUE: Contiguous axial images were obtained from the base of the skull  through the vertex without intravenous contrast. COMPARISON:  Head CT 10/31/2020 FINDINGS: Brain: There is no evidence of acute intracranial hemorrhage, extra-axial fluid collection, or infarct. Global  parenchymal volume loss with enlargement of the ventricular system is similar to the prior study. Extensive hypodensity throughout the subcortical and periventricular white matter likely reflects advanced chronic white matter microangiopathy, unchanged. No mass lesion is identified. There is no midline shift. Vascular: There is calcification of the bilateral cavernous ICAs. Skull: Normal. Negative for fracture or focal lesion. Sinuses/Orbits: The imaged paranasal sinuses are clear. Bilateral lens implants are in place. The imaged globes and orbits are otherwise unremarkable. Other: The mastoid air cells are clear. A small soft tissue density nodule in the left frontoparietal scalp is unchanged and may reflect a trichilemmal cyst. IMPRESSION: 1. No acute intracranial hemorrhage or calvarial fracture. 2. Unchanged parenchymal volume loss and advanced chronic white matter microangiopathy. Electronically Signed   By: Valetta Mole M.D.   On: 07/15/2021 11:21   CT Cervical Spine Wo Contrast  Result Date: 07/15/2021 CLINICAL DATA:  Fall EXAM: CT CERVICAL SPINE WITHOUT CONTRAST TECHNIQUE: Multidetector CT imaging of the cervical spine was performed without intravenous contrast. Multiplanar CT image reconstructions were also generated. COMPARISON:  None. FINDINGS: Alignment: There is trace retrolisthesis of C3 on C4 and stepwise grade 1 anterolisthesis of C4 on C5, C5 on C6, and C6 on C7, all likely degenerative in nature. There is no evidence of traumatic malalignment. There is no jumped or perched facet. Skull base and vertebrae: Skull base alignment is maintained. Vertebral body heights are preserved. There is well corticated lucency through the left C1 transverse foramen, likely chronic (3-21). There is no evidence of acute fracture. There is partial fusion of the C2 and C3 vertebral bodies. Soft tissues and spinal canal: No prevertebral fluid or swelling. No visible canal hematoma. Disc levels: There is degenerative  pannus about the dens without significant narrowing of the craniocervical junction. There is multilevel disc space narrowing, most advanced at C6-C7 and C7-T1. There is multilevel uncovertebral and facet arthropathy, most advanced on the right at C4-C5. The osseous spinal canal is patent. There is moderate to severe bilateral neural foraminal stenosis at C3-C4 and on the right at C4-C5. Upper chest: The lung apices are clear. Other: There is calcified atherosclerotic plaque at the bilateral carotid bulbs. IMPRESSION: 1. No acute fracture or traumatic malalignment of the cervical spine. 2. Multilevel spondylosis and degenerative spondylolisthesis detailed above. Electronically Signed   By: Valetta Mole M.D.   On: 07/15/2021 11:29    Procedures Procedures   Medications Ordered in ED Medications - No data to display  ED Course  I have reviewed the triage vital signs and the nursing notes.  Pertinent labs & imaging results that were available during my care of the patient were reviewed by me and considered in my medical decision making (see chart for details).  Clinical Course as of 07/15/21 1401  Mon Jul 15, 2021  1123 Chest x-ray without acute findings [JK]  1316 Head CT and C-spine CT without acute findings. [JK]  0000000 CBC and metabolic panel unremarkable [JK]  1330 Attempted to call daughter and wife.  No answer.   [JK]    Clinical Course User Index [JK] Dorie Rank, MD   MDM Rules/Calculators/A&P                           Patient presented to the ED for evaluation after sliding  out of his chair.  There were concerns for possible injury.  Patient was evaluated for possible anemia and dehydration that could have contributed to his weakness.  Laboratory tests are unremarkable.  Patient does not have any definite injuries on exam.  Head CT and C-spine CT without acute findings.  Patient's daughter is at the bedside and she states patient appears to be at his baseline.  At this point I suspect  some of his memory issues is related to his dementia.  No signs of acute infection stroke or other emergency medical condition.  He appears stable for discharge back to the nursing facility Final Clinical Impression(s) / ED Diagnoses Final diagnoses:  Fall, initial encounter  Dementia without behavioral disturbance, unspecified dementia type [facility])    Rx / DC Orders ED Discharge Orders     None        Dorie Rank, MD 07/15/21 1401

## 2021-07-15 NOTE — ED Notes (Signed)
Pt to xray at this time.

## 2021-07-17 DIAGNOSIS — N3941 Urge incontinence: Secondary | ICD-10-CM | POA: Diagnosis not present

## 2021-07-17 DIAGNOSIS — M6281 Muscle weakness (generalized): Secondary | ICD-10-CM | POA: Diagnosis not present

## 2021-07-17 DIAGNOSIS — R278 Other lack of coordination: Secondary | ICD-10-CM | POA: Diagnosis not present

## 2021-07-17 DIAGNOSIS — R488 Other symbolic dysfunctions: Secondary | ICD-10-CM | POA: Diagnosis not present

## 2021-07-17 DIAGNOSIS — R41841 Cognitive communication deficit: Secondary | ICD-10-CM | POA: Diagnosis not present

## 2021-07-18 DIAGNOSIS — R488 Other symbolic dysfunctions: Secondary | ICD-10-CM | POA: Diagnosis not present

## 2021-07-18 DIAGNOSIS — R41841 Cognitive communication deficit: Secondary | ICD-10-CM | POA: Diagnosis not present

## 2021-07-23 DIAGNOSIS — R41841 Cognitive communication deficit: Secondary | ICD-10-CM | POA: Diagnosis not present

## 2021-07-23 DIAGNOSIS — R488 Other symbolic dysfunctions: Secondary | ICD-10-CM | POA: Diagnosis not present

## 2021-07-25 DIAGNOSIS — R488 Other symbolic dysfunctions: Secondary | ICD-10-CM | POA: Diagnosis not present

## 2021-07-25 DIAGNOSIS — R41841 Cognitive communication deficit: Secondary | ICD-10-CM | POA: Diagnosis not present

## 2021-07-26 DIAGNOSIS — R41841 Cognitive communication deficit: Secondary | ICD-10-CM | POA: Diagnosis not present

## 2021-07-26 DIAGNOSIS — R488 Other symbolic dysfunctions: Secondary | ICD-10-CM | POA: Diagnosis not present

## 2021-07-29 DIAGNOSIS — R41841 Cognitive communication deficit: Secondary | ICD-10-CM | POA: Diagnosis not present

## 2021-07-29 DIAGNOSIS — R488 Other symbolic dysfunctions: Secondary | ICD-10-CM | POA: Diagnosis not present

## 2021-07-31 DIAGNOSIS — R41841 Cognitive communication deficit: Secondary | ICD-10-CM | POA: Diagnosis not present

## 2021-07-31 DIAGNOSIS — R488 Other symbolic dysfunctions: Secondary | ICD-10-CM | POA: Diagnosis not present

## 2021-08-01 DIAGNOSIS — R488 Other symbolic dysfunctions: Secondary | ICD-10-CM | POA: Diagnosis not present

## 2021-08-01 DIAGNOSIS — R41841 Cognitive communication deficit: Secondary | ICD-10-CM | POA: Diagnosis not present

## 2021-08-05 DIAGNOSIS — R41841 Cognitive communication deficit: Secondary | ICD-10-CM | POA: Diagnosis not present

## 2021-08-05 DIAGNOSIS — R488 Other symbolic dysfunctions: Secondary | ICD-10-CM | POA: Diagnosis not present

## 2021-08-08 DIAGNOSIS — R41841 Cognitive communication deficit: Secondary | ICD-10-CM | POA: Diagnosis not present

## 2021-08-08 DIAGNOSIS — R488 Other symbolic dysfunctions: Secondary | ICD-10-CM | POA: Diagnosis not present

## 2021-08-13 DIAGNOSIS — R41841 Cognitive communication deficit: Secondary | ICD-10-CM | POA: Diagnosis not present

## 2021-08-13 DIAGNOSIS — R488 Other symbolic dysfunctions: Secondary | ICD-10-CM | POA: Diagnosis not present

## 2021-08-16 DIAGNOSIS — R488 Other symbolic dysfunctions: Secondary | ICD-10-CM | POA: Diagnosis not present

## 2021-08-16 DIAGNOSIS — R41841 Cognitive communication deficit: Secondary | ICD-10-CM | POA: Diagnosis not present

## 2021-08-20 DIAGNOSIS — R488 Other symbolic dysfunctions: Secondary | ICD-10-CM | POA: Diagnosis not present

## 2021-08-20 DIAGNOSIS — R41841 Cognitive communication deficit: Secondary | ICD-10-CM | POA: Diagnosis not present

## 2021-08-23 DIAGNOSIS — R488 Other symbolic dysfunctions: Secondary | ICD-10-CM | POA: Diagnosis not present

## 2021-08-23 DIAGNOSIS — R41841 Cognitive communication deficit: Secondary | ICD-10-CM | POA: Diagnosis not present

## 2021-08-28 DIAGNOSIS — R488 Other symbolic dysfunctions: Secondary | ICD-10-CM | POA: Diagnosis not present

## 2021-08-28 DIAGNOSIS — R41841 Cognitive communication deficit: Secondary | ICD-10-CM | POA: Diagnosis not present

## 2021-08-30 DIAGNOSIS — R488 Other symbolic dysfunctions: Secondary | ICD-10-CM | POA: Diagnosis not present

## 2021-08-30 DIAGNOSIS — R41841 Cognitive communication deficit: Secondary | ICD-10-CM | POA: Diagnosis not present

## 2021-09-02 DIAGNOSIS — R488 Other symbolic dysfunctions: Secondary | ICD-10-CM | POA: Diagnosis not present

## 2021-09-02 DIAGNOSIS — R41841 Cognitive communication deficit: Secondary | ICD-10-CM | POA: Diagnosis not present

## 2021-09-04 DIAGNOSIS — R488 Other symbolic dysfunctions: Secondary | ICD-10-CM | POA: Diagnosis not present

## 2021-09-04 DIAGNOSIS — H04123 Dry eye syndrome of bilateral lacrimal glands: Secondary | ICD-10-CM | POA: Diagnosis not present

## 2021-09-04 DIAGNOSIS — G20 Parkinson's disease: Secondary | ICD-10-CM | POA: Diagnosis not present

## 2021-09-04 DIAGNOSIS — H353131 Nonexudative age-related macular degeneration, bilateral, early dry stage: Secondary | ICD-10-CM | POA: Diagnosis not present

## 2021-09-04 DIAGNOSIS — R41841 Cognitive communication deficit: Secondary | ICD-10-CM | POA: Diagnosis not present

## 2021-09-04 DIAGNOSIS — Z961 Presence of intraocular lens: Secondary | ICD-10-CM | POA: Diagnosis not present

## 2021-09-09 DIAGNOSIS — R350 Frequency of micturition: Secondary | ICD-10-CM | POA: Diagnosis not present

## 2021-09-10 DIAGNOSIS — R488 Other symbolic dysfunctions: Secondary | ICD-10-CM | POA: Diagnosis not present

## 2021-09-10 DIAGNOSIS — R41841 Cognitive communication deficit: Secondary | ICD-10-CM | POA: Diagnosis not present

## 2021-09-25 DIAGNOSIS — F02C0 Dementia in other diseases classified elsewhere, severe, without behavioral disturbance, psychotic disturbance, mood disturbance, and anxiety: Secondary | ICD-10-CM | POA: Diagnosis not present

## 2021-09-25 DIAGNOSIS — G20 Parkinson's disease: Secondary | ICD-10-CM | POA: Diagnosis not present

## 2021-09-25 DIAGNOSIS — G309 Alzheimer's disease, unspecified: Secondary | ICD-10-CM | POA: Diagnosis not present

## 2021-09-25 DIAGNOSIS — Z8551 Personal history of malignant neoplasm of bladder: Secondary | ICD-10-CM | POA: Diagnosis not present
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
## 2021-10-04 DIAGNOSIS — Z111 Encounter for screening for respiratory tuberculosis: Secondary | ICD-10-CM | POA: Diagnosis not present

## 2021-10-07 ENCOUNTER — Ambulatory Visit (INDEPENDENT_AMBULATORY_CARE_PROVIDER_SITE_OTHER): Payer: Medicare Other | Admitting: Neurology

## 2021-10-07 ENCOUNTER — Other Ambulatory Visit: Payer: Self-pay

## 2021-10-07 ENCOUNTER — Encounter: Payer: Self-pay | Admitting: Neurology

## 2021-10-07 VITALS — BP 125/62 | HR 76 | Ht 69.0 in | Wt 137.0 lb

## 2021-10-07 DIAGNOSIS — F02818 Dementia in other diseases classified elsewhere, unspecified severity, with other behavioral disturbance: Secondary | ICD-10-CM

## 2021-10-07 DIAGNOSIS — F02C11 Dementia in other diseases classified elsewhere, severe, with agitation: Secondary | ICD-10-CM

## 2021-10-07 DIAGNOSIS — G20 Parkinson's disease: Secondary | ICD-10-CM | POA: Diagnosis not present

## 2021-10-07 DIAGNOSIS — G301 Alzheimer's disease with late onset: Secondary | ICD-10-CM | POA: Diagnosis not present

## 2021-10-07 MED ORDER — QUETIAPINE FUMARATE 25 MG PO TABS: 12.5000 mg | ORAL_TABLET | Freq: Every day | ORAL | 1 refills | Status: AC

## 2021-10-07 NOTE — Patient Instructions (Signed)
Check about the myrbetriq and see if you are still on it  Start quetiapine 25 mg, 1/2 tablet at bedtime.

## 2021-10-14 DIAGNOSIS — Z20828 Contact with and (suspected) exposure to other viral communicable diseases: Secondary | ICD-10-CM | POA: Diagnosis not present

## 2021-10-14 DIAGNOSIS — Z1159 Encounter for screening for other viral diseases: Secondary | ICD-10-CM | POA: Diagnosis not present

## 2021-10-21 DIAGNOSIS — Z20828 Contact with and (suspected) exposure to other viral communicable diseases: Secondary | ICD-10-CM | POA: Diagnosis not present

## 2021-10-21 DIAGNOSIS — Z1159 Encounter for screening for other viral diseases: Secondary | ICD-10-CM | POA: Diagnosis not present

## 2021-10-23 DIAGNOSIS — G301 Alzheimer's disease with late onset: Secondary | ICD-10-CM | POA: Diagnosis not present

## 2021-10-23 DIAGNOSIS — S51011D Laceration without foreign body of right elbow, subsequent encounter: Secondary | ICD-10-CM | POA: Diagnosis not present

## 2021-10-23 DIAGNOSIS — G20 Parkinson's disease: Secondary | ICD-10-CM | POA: Diagnosis not present

## 2021-10-23 DIAGNOSIS — Z9181 History of falling: Secondary | ICD-10-CM | POA: Diagnosis not present

## 2021-10-23 DIAGNOSIS — F02811 Dementia in other diseases classified elsewhere, unspecified severity, with agitation: Secondary | ICD-10-CM | POA: Diagnosis not present

## 2021-10-23 DIAGNOSIS — S81811D Laceration without foreign body, right lower leg, subsequent encounter: Secondary | ICD-10-CM | POA: Diagnosis not present

## 2021-10-23 DIAGNOSIS — Z7951 Long term (current) use of inhaled steroids: Secondary | ICD-10-CM | POA: Diagnosis not present

## 2021-10-23 DIAGNOSIS — S81812D Laceration without foreign body, left lower leg, subsequent encounter: Secondary | ICD-10-CM | POA: Diagnosis not present

## 2021-10-28 DIAGNOSIS — Z1159 Encounter for screening for other viral diseases: Secondary | ICD-10-CM | POA: Diagnosis not present

## 2021-10-28 DIAGNOSIS — Z20828 Contact with and (suspected) exposure to other viral communicable diseases: Secondary | ICD-10-CM | POA: Diagnosis not present

## 2021-10-29 DIAGNOSIS — G20 Parkinson's disease: Secondary | ICD-10-CM | POA: Diagnosis not present

## 2021-10-29 DIAGNOSIS — S81811D Laceration without foreign body, right lower leg, subsequent encounter: Secondary | ICD-10-CM | POA: Diagnosis not present

## 2021-10-29 DIAGNOSIS — S51011D Laceration without foreign body of right elbow, subsequent encounter: Secondary | ICD-10-CM | POA: Diagnosis not present

## 2021-10-29 DIAGNOSIS — G301 Alzheimer's disease with late onset: Secondary | ICD-10-CM | POA: Diagnosis not present

## 2021-10-29 DIAGNOSIS — F02811 Dementia in other diseases classified elsewhere, unspecified severity, with agitation: Secondary | ICD-10-CM | POA: Diagnosis not present

## 2021-10-29 DIAGNOSIS — S81812D Laceration without foreign body, left lower leg, subsequent encounter: Secondary | ICD-10-CM | POA: Diagnosis not present

## 2021-10-30 DIAGNOSIS — Z1159 Encounter for screening for other viral diseases: Secondary | ICD-10-CM | POA: Diagnosis not present

## 2021-10-30 DIAGNOSIS — Z20828 Contact with and (suspected) exposure to other viral communicable diseases: Secondary | ICD-10-CM | POA: Diagnosis not present

## 2021-11-01 DIAGNOSIS — Z20828 Contact with and (suspected) exposure to other viral communicable diseases: Secondary | ICD-10-CM | POA: Diagnosis not present

## 2021-11-01 DIAGNOSIS — S81811D Laceration without foreign body, right lower leg, subsequent encounter: Secondary | ICD-10-CM | POA: Diagnosis not present

## 2021-11-01 DIAGNOSIS — Z1159 Encounter for screening for other viral diseases: Secondary | ICD-10-CM | POA: Diagnosis not present

## 2021-11-01 DIAGNOSIS — F02811 Dementia in other diseases classified elsewhere, unspecified severity, with agitation: Secondary | ICD-10-CM | POA: Diagnosis not present

## 2021-11-01 DIAGNOSIS — S51011D Laceration without foreign body of right elbow, subsequent encounter: Secondary | ICD-10-CM | POA: Diagnosis not present

## 2021-11-01 DIAGNOSIS — G301 Alzheimer's disease with late onset: Secondary | ICD-10-CM | POA: Diagnosis not present

## 2021-11-01 DIAGNOSIS — S81812D Laceration without foreign body, left lower leg, subsequent encounter: Secondary | ICD-10-CM | POA: Diagnosis not present

## 2021-11-01 DIAGNOSIS — G20 Parkinson's disease: Secondary | ICD-10-CM | POA: Diagnosis not present

## 2021-11-04 DIAGNOSIS — G20 Parkinson's disease: Secondary | ICD-10-CM | POA: Diagnosis not present

## 2021-11-04 DIAGNOSIS — S51011D Laceration without foreign body of right elbow, subsequent encounter: Secondary | ICD-10-CM | POA: Diagnosis not present

## 2021-11-04 DIAGNOSIS — S81812D Laceration without foreign body, left lower leg, subsequent encounter: Secondary | ICD-10-CM | POA: Diagnosis not present

## 2021-11-04 DIAGNOSIS — F02811 Dementia in other diseases classified elsewhere, unspecified severity, with agitation: Secondary | ICD-10-CM | POA: Diagnosis not present

## 2021-11-04 DIAGNOSIS — S81811D Laceration without foreign body, right lower leg, subsequent encounter: Secondary | ICD-10-CM | POA: Diagnosis not present

## 2021-11-04 DIAGNOSIS — Z1159 Encounter for screening for other viral diseases: Secondary | ICD-10-CM | POA: Diagnosis not present

## 2021-11-04 DIAGNOSIS — Z20828 Contact with and (suspected) exposure to other viral communicable diseases: Secondary | ICD-10-CM | POA: Diagnosis not present

## 2021-11-04 DIAGNOSIS — G301 Alzheimer's disease with late onset: Secondary | ICD-10-CM | POA: Diagnosis not present

## 2021-11-06 DIAGNOSIS — Z1159 Encounter for screening for other viral diseases: Secondary | ICD-10-CM | POA: Diagnosis not present

## 2021-11-06 DIAGNOSIS — Z20828 Contact with and (suspected) exposure to other viral communicable diseases: Secondary | ICD-10-CM | POA: Diagnosis not present

## 2021-11-07 DIAGNOSIS — F02811 Dementia in other diseases classified elsewhere, unspecified severity, with agitation: Secondary | ICD-10-CM | POA: Diagnosis not present

## 2021-11-07 DIAGNOSIS — S81811D Laceration without foreign body, right lower leg, subsequent encounter: Secondary | ICD-10-CM | POA: Diagnosis not present

## 2021-11-07 DIAGNOSIS — S81812D Laceration without foreign body, left lower leg, subsequent encounter: Secondary | ICD-10-CM | POA: Diagnosis not present

## 2021-11-07 DIAGNOSIS — G20 Parkinson's disease: Secondary | ICD-10-CM | POA: Diagnosis not present

## 2021-11-07 DIAGNOSIS — S51011D Laceration without foreign body of right elbow, subsequent encounter: Secondary | ICD-10-CM | POA: Diagnosis not present

## 2021-11-07 DIAGNOSIS — G301 Alzheimer's disease with late onset: Secondary | ICD-10-CM | POA: Diagnosis not present

## 2021-11-11 DIAGNOSIS — Z20828 Contact with and (suspected) exposure to other viral communicable diseases: Secondary | ICD-10-CM | POA: Diagnosis not present

## 2021-11-11 DIAGNOSIS — Z1159 Encounter for screening for other viral diseases: Secondary | ICD-10-CM | POA: Diagnosis not present

## 2021-11-13 DIAGNOSIS — Z1159 Encounter for screening for other viral diseases: Secondary | ICD-10-CM | POA: Diagnosis not present

## 2021-11-13 DIAGNOSIS — Z20828 Contact with and (suspected) exposure to other viral communicable diseases: Secondary | ICD-10-CM | POA: Diagnosis not present

## 2021-11-14 DIAGNOSIS — S81812D Laceration without foreign body, left lower leg, subsequent encounter: Secondary | ICD-10-CM | POA: Diagnosis not present

## 2021-11-14 DIAGNOSIS — G301 Alzheimer's disease with late onset: Secondary | ICD-10-CM | POA: Diagnosis not present

## 2021-11-14 DIAGNOSIS — S81811D Laceration without foreign body, right lower leg, subsequent encounter: Secondary | ICD-10-CM | POA: Diagnosis not present

## 2021-11-14 DIAGNOSIS — F02811 Dementia in other diseases classified elsewhere, unspecified severity, with agitation: Secondary | ICD-10-CM | POA: Diagnosis not present

## 2021-11-14 DIAGNOSIS — G20 Parkinson's disease: Secondary | ICD-10-CM | POA: Diagnosis not present

## 2021-11-14 DIAGNOSIS — S51011D Laceration without foreign body of right elbow, subsequent encounter: Secondary | ICD-10-CM | POA: Diagnosis not present

## 2021-11-15 DIAGNOSIS — F02811 Dementia in other diseases classified elsewhere, unspecified severity, with agitation: Secondary | ICD-10-CM | POA: Diagnosis not present

## 2021-11-15 DIAGNOSIS — G301 Alzheimer's disease with late onset: Secondary | ICD-10-CM | POA: Diagnosis not present

## 2021-11-15 DIAGNOSIS — S51011D Laceration without foreign body of right elbow, subsequent encounter: Secondary | ICD-10-CM | POA: Diagnosis not present

## 2021-11-15 DIAGNOSIS — S81812D Laceration without foreign body, left lower leg, subsequent encounter: Secondary | ICD-10-CM | POA: Diagnosis not present

## 2021-11-15 DIAGNOSIS — S81811D Laceration without foreign body, right lower leg, subsequent encounter: Secondary | ICD-10-CM | POA: Diagnosis not present

## 2021-11-15 DIAGNOSIS — G20 Parkinson's disease: Secondary | ICD-10-CM | POA: Diagnosis not present

## 2021-11-19 DIAGNOSIS — F02811 Dementia in other diseases classified elsewhere, unspecified severity, with agitation: Secondary | ICD-10-CM | POA: Diagnosis not present

## 2021-11-19 DIAGNOSIS — S51011D Laceration without foreign body of right elbow, subsequent encounter: Secondary | ICD-10-CM | POA: Diagnosis not present

## 2021-11-19 DIAGNOSIS — G301 Alzheimer's disease with late onset: Secondary | ICD-10-CM | POA: Diagnosis not present

## 2021-11-19 DIAGNOSIS — G20 Parkinson's disease: Secondary | ICD-10-CM | POA: Diagnosis not present

## 2021-11-19 DIAGNOSIS — S81811D Laceration without foreign body, right lower leg, subsequent encounter: Secondary | ICD-10-CM | POA: Diagnosis not present

## 2021-11-19 DIAGNOSIS — S81812D Laceration without foreign body, left lower leg, subsequent encounter: Secondary | ICD-10-CM | POA: Diagnosis not present

## 2021-11-20 DIAGNOSIS — Z1159 Encounter for screening for other viral diseases: Secondary | ICD-10-CM | POA: Diagnosis not present

## 2021-11-20 DIAGNOSIS — Z20828 Contact with and (suspected) exposure to other viral communicable diseases: Secondary | ICD-10-CM | POA: Diagnosis not present

## 2021-11-22 DIAGNOSIS — Z7951 Long term (current) use of inhaled steroids: Secondary | ICD-10-CM | POA: Diagnosis not present

## 2021-11-22 DIAGNOSIS — S81811D Laceration without foreign body, right lower leg, subsequent encounter: Secondary | ICD-10-CM | POA: Diagnosis not present

## 2021-11-22 DIAGNOSIS — F02811 Dementia in other diseases classified elsewhere, unspecified severity, with agitation: Secondary | ICD-10-CM | POA: Diagnosis not present

## 2021-11-22 DIAGNOSIS — G301 Alzheimer's disease with late onset: Secondary | ICD-10-CM | POA: Diagnosis not present

## 2021-11-22 DIAGNOSIS — S81812D Laceration without foreign body, left lower leg, subsequent encounter: Secondary | ICD-10-CM | POA: Diagnosis not present

## 2021-11-22 DIAGNOSIS — S51011D Laceration without foreign body of right elbow, subsequent encounter: Secondary | ICD-10-CM | POA: Diagnosis not present

## 2021-11-22 DIAGNOSIS — Z9181 History of falling: Secondary | ICD-10-CM | POA: Diagnosis not present

## 2021-11-22 DIAGNOSIS — G20 Parkinson's disease: Secondary | ICD-10-CM | POA: Diagnosis not present

## 2021-11-26 DIAGNOSIS — S81812D Laceration without foreign body, left lower leg, subsequent encounter: Secondary | ICD-10-CM | POA: Diagnosis not present

## 2021-11-26 DIAGNOSIS — F02811 Dementia in other diseases classified elsewhere, unspecified severity, with agitation: Secondary | ICD-10-CM | POA: Diagnosis not present

## 2021-11-26 DIAGNOSIS — G301 Alzheimer's disease with late onset: Secondary | ICD-10-CM | POA: Diagnosis not present

## 2021-11-26 DIAGNOSIS — G20 Parkinson's disease: Secondary | ICD-10-CM | POA: Diagnosis not present

## 2021-11-26 DIAGNOSIS — S81811D Laceration without foreign body, right lower leg, subsequent encounter: Secondary | ICD-10-CM | POA: Diagnosis not present

## 2021-11-26 DIAGNOSIS — S51011D Laceration without foreign body of right elbow, subsequent encounter: Secondary | ICD-10-CM | POA: Diagnosis not present

## 2021-11-27 ENCOUNTER — Ambulatory Visit: Payer: Medicare Other | Admitting: Dermatology

## 2021-11-27 DIAGNOSIS — Z20828 Contact with and (suspected) exposure to other viral communicable diseases: Secondary | ICD-10-CM | POA: Diagnosis not present

## 2021-11-27 DIAGNOSIS — Z1159 Encounter for screening for other viral diseases: Secondary | ICD-10-CM | POA: Diagnosis not present

## 2021-12-02 ENCOUNTER — Ambulatory Visit: Payer: Medicare Other | Admitting: Dermatology

## 2021-12-04 ENCOUNTER — Other Ambulatory Visit: Payer: Self-pay | Admitting: Neurology

## 2021-12-04 DIAGNOSIS — G20 Parkinson's disease: Secondary | ICD-10-CM | POA: Diagnosis not present

## 2021-12-04 DIAGNOSIS — Z961 Presence of intraocular lens: Secondary | ICD-10-CM | POA: Diagnosis not present

## 2021-12-04 DIAGNOSIS — S81812D Laceration without foreign body, left lower leg, subsequent encounter: Secondary | ICD-10-CM | POA: Diagnosis not present

## 2021-12-04 DIAGNOSIS — R319 Hematuria, unspecified: Secondary | ICD-10-CM | POA: Diagnosis not present

## 2021-12-04 DIAGNOSIS — F02811 Dementia in other diseases classified elsewhere, unspecified severity, with agitation: Secondary | ICD-10-CM | POA: Diagnosis not present

## 2021-12-04 DIAGNOSIS — G301 Alzheimer's disease with late onset: Secondary | ICD-10-CM | POA: Diagnosis not present

## 2021-12-04 DIAGNOSIS — S81811D Laceration without foreign body, right lower leg, subsequent encounter: Secondary | ICD-10-CM | POA: Diagnosis not present

## 2021-12-04 DIAGNOSIS — H04123 Dry eye syndrome of bilateral lacrimal glands: Secondary | ICD-10-CM | POA: Diagnosis not present

## 2021-12-04 DIAGNOSIS — S51011D Laceration without foreign body of right elbow, subsequent encounter: Secondary | ICD-10-CM | POA: Diagnosis not present

## 2021-12-04 DIAGNOSIS — H353131 Nonexudative age-related macular degeneration, bilateral, early dry stage: Secondary | ICD-10-CM | POA: Diagnosis not present

## 2021-12-05 ENCOUNTER — Other Ambulatory Visit: Payer: Self-pay | Admitting: Neurology

## 2021-12-13 DIAGNOSIS — S81812D Laceration without foreign body, left lower leg, subsequent encounter: Secondary | ICD-10-CM | POA: Diagnosis not present

## 2021-12-13 DIAGNOSIS — G301 Alzheimer's disease with late onset: Secondary | ICD-10-CM | POA: Diagnosis not present

## 2021-12-13 DIAGNOSIS — S81811D Laceration without foreign body, right lower leg, subsequent encounter: Secondary | ICD-10-CM | POA: Diagnosis not present

## 2021-12-13 DIAGNOSIS — S51011D Laceration without foreign body of right elbow, subsequent encounter: Secondary | ICD-10-CM | POA: Diagnosis not present

## 2021-12-13 DIAGNOSIS — G20 Parkinson's disease: Secondary | ICD-10-CM | POA: Diagnosis not present

## 2021-12-13 DIAGNOSIS — F02811 Dementia in other diseases classified elsewhere, unspecified severity, with agitation: Secondary | ICD-10-CM | POA: Diagnosis not present

## 2021-12-20 DIAGNOSIS — S81811D Laceration without foreign body, right lower leg, subsequent encounter: Secondary | ICD-10-CM | POA: Diagnosis not present

## 2021-12-20 DIAGNOSIS — G301 Alzheimer's disease with late onset: Secondary | ICD-10-CM | POA: Diagnosis not present

## 2021-12-20 DIAGNOSIS — S81812D Laceration without foreign body, left lower leg, subsequent encounter: Secondary | ICD-10-CM | POA: Diagnosis not present

## 2021-12-20 DIAGNOSIS — S51011D Laceration without foreign body of right elbow, subsequent encounter: Secondary | ICD-10-CM | POA: Diagnosis not present

## 2021-12-20 DIAGNOSIS — F02811 Dementia in other diseases classified elsewhere, unspecified severity, with agitation: Secondary | ICD-10-CM | POA: Diagnosis not present

## 2021-12-20 DIAGNOSIS — G20 Parkinson's disease: Secondary | ICD-10-CM | POA: Diagnosis not present

## 2021-12-23 DIAGNOSIS — I10 Essential (primary) hypertension: Secondary | ICD-10-CM | POA: Diagnosis not present

## 2021-12-23 DIAGNOSIS — F02B0 Dementia in other diseases classified elsewhere, moderate, without behavioral disturbance, psychotic disturbance, mood disturbance, and anxiety: Secondary | ICD-10-CM | POA: Diagnosis not present

## 2021-12-23 DIAGNOSIS — K219 Gastro-esophageal reflux disease without esophagitis: Secondary | ICD-10-CM | POA: Diagnosis not present

## 2021-12-23 DIAGNOSIS — G301 Alzheimer's disease with late onset: Secondary | ICD-10-CM | POA: Diagnosis not present

## 2021-12-23 DIAGNOSIS — G20 Parkinson's disease: Secondary | ICD-10-CM | POA: Diagnosis not present

## 2021-12-23 DIAGNOSIS — R296 Repeated falls: Secondary | ICD-10-CM | POA: Diagnosis not present

## 2021-12-23 DIAGNOSIS — Z79899 Other long term (current) drug therapy: Secondary | ICD-10-CM | POA: Diagnosis not present

## 2021-12-25 DIAGNOSIS — U099 Post covid-19 condition, unspecified: Secondary | ICD-10-CM | POA: Diagnosis not present

## 2021-12-25 DIAGNOSIS — R052 Subacute cough: Secondary | ICD-10-CM | POA: Diagnosis not present

## 2022-01-07 ENCOUNTER — Emergency Department (HOSPITAL_COMMUNITY): Payer: Medicare Other

## 2022-01-07 ENCOUNTER — Emergency Department (HOSPITAL_COMMUNITY)
Admission: EM | Admit: 2022-01-07 | Discharge: 2022-01-07 | Disposition: A | Discharge status: Home / Self Care | Payer: Medicare Other | Attending: Emergency Medicine | Admitting: Emergency Medicine

## 2022-01-07 DIAGNOSIS — W19XXXA Unspecified fall, initial encounter: Secondary | ICD-10-CM | POA: Diagnosis not present

## 2022-01-07 DIAGNOSIS — R58 Hemorrhage, not elsewhere classified: Secondary | ICD-10-CM | POA: Diagnosis not present

## 2022-01-07 DIAGNOSIS — M47812 Spondylosis without myelopathy or radiculopathy, cervical region: Secondary | ICD-10-CM | POA: Diagnosis not present

## 2022-01-07 DIAGNOSIS — G20 Parkinson's disease: Secondary | ICD-10-CM | POA: Insufficient documentation

## 2022-01-07 DIAGNOSIS — R410 Disorientation, unspecified: Secondary | ICD-10-CM | POA: Diagnosis not present

## 2022-01-07 DIAGNOSIS — I6529 Occlusion and stenosis of unspecified carotid artery: Secondary | ICD-10-CM | POA: Diagnosis not present

## 2022-01-07 DIAGNOSIS — S199XXA Unspecified injury of neck, initial encounter: Secondary | ICD-10-CM | POA: Diagnosis not present

## 2022-01-07 DIAGNOSIS — F0390 Unspecified dementia without behavioral disturbance: Secondary | ICD-10-CM | POA: Insufficient documentation

## 2022-01-07 DIAGNOSIS — W01198A Fall on same level from slipping, tripping and stumbling with subsequent striking against other object, initial encounter: Secondary | ICD-10-CM | POA: Insufficient documentation

## 2022-01-07 DIAGNOSIS — Z981 Arthrodesis status: Secondary | ICD-10-CM | POA: Diagnosis not present

## 2022-01-07 DIAGNOSIS — S0101XA Laceration without foreign body of scalp, initial encounter: Secondary | ICD-10-CM | POA: Diagnosis not present

## 2022-01-07 DIAGNOSIS — Z7902 Long term (current) use of antithrombotics/antiplatelets: Secondary | ICD-10-CM | POA: Insufficient documentation

## 2022-01-07 DIAGNOSIS — Z955 Presence of coronary angioplasty implant and graft: Secondary | ICD-10-CM | POA: Diagnosis not present

## 2022-01-07 DIAGNOSIS — Z043 Encounter for examination and observation following other accident: Secondary | ICD-10-CM | POA: Diagnosis not present

## 2022-01-07 DIAGNOSIS — S0990XA Unspecified injury of head, initial encounter: Secondary | ICD-10-CM | POA: Diagnosis not present

## 2022-01-07 DIAGNOSIS — M4312 Spondylolisthesis, cervical region: Secondary | ICD-10-CM | POA: Diagnosis not present

## 2022-01-07 DIAGNOSIS — G319 Degenerative disease of nervous system, unspecified: Secondary | ICD-10-CM | POA: Diagnosis not present

## 2022-01-07 DIAGNOSIS — I10 Essential (primary) hypertension: Secondary | ICD-10-CM | POA: Diagnosis not present

## 2022-01-07 LAB — I-STAT CHEM 8, ED
BUN: 26 mg/dL — ABNORMAL HIGH (ref 8–23)
Calcium, Ion: 1.15 mmol/L (ref 1.15–1.40)
Chloride: 107 mmol/L (ref 98–111)
Creatinine, Ser: 1 mg/dL (ref 0.61–1.24)
Glucose, Bld: 120 mg/dL — ABNORMAL HIGH (ref 70–99)
HCT: 36 % — ABNORMAL LOW (ref 39.0–52.0)
Hemoglobin: 12.2 g/dL — ABNORMAL LOW (ref 13.0–17.0)
Potassium: 3.9 mmol/L (ref 3.5–5.1)
Sodium: 144 mmol/L (ref 135–145)
TCO2: 28 mmol/L (ref 22–32)

## 2022-01-07 MED ORDER — IOHEXOL 350 MG/ML SOLN: 100.0000 mL | Freq: Once | INTRAVENOUS | Status: DC | PRN

## 2022-01-07 MED ORDER — LIDOCAINE-EPINEPHRINE (PF) 2 %-1:200000 IJ SOLN
20.0000 mL | Freq: Once | INTRAMUSCULAR | Status: AC
  Administered 2022-01-07: 20 mL
  Filled 2022-01-07: qty 20

## 2022-01-07 NOTE — ED Provider Notes (Signed)
[facility] EMERGENCY DEPARTMENT Provider Note   CSN: [id number] Arrival date & time: 01/07/22  1534     History  Chief Complaint  Patient presents with   [name] [name] is a 88 y.o. male.  Patient with history of Parkinson's disease and dementia presents to the emergency department today for evaluation of fall and head injury.  Patient is unable to contribute to history.  EMS reports that the patient was trying to stand up from a sitting position and fell striking the back of his head on a door frame.  EMS was called for transport.  They do not report a significant amount of blood loss on scene, and that the blood that they saw was on the patient.  Patient was a level 2 trauma due to use of Plavix.  Patient's daughter arrived later.  She states that patient typically falls daily.  They try not to send him to the hospital for every fall but needed to today due to the laceration.  Patient recently had COVID and has been slow to fully recover, noting a decline in his cognitive status.  Patient's daughter does not currently have any concerns about the patient and his health, mental status changes, overall worsening weakness, etc.      Home Medications Prior to Admission medications   Medication Sig Start Date End Date Taking? Authorizing Provider  carbidopa-levodopa (SINEMET IR) 25-100 MG tablet TAKE 1 AND 1/2 TABLETS BY MOUTH THREE TIMES DAILY 12/05/21   Tat, Eustace Quail, DO  carbidopa-levodopa (SINEMET IR) 25-100 MG tablet TAKE 1 AND 1/2 TABLETS BY MOUTH THREE TIMES DAILY 12/05/21   Tat, Eustace Quail, DO  donepezil (ARICEPT) 10 MG tablet TAKE 1 TABLET(10 MG) BY MOUTH AT BEDTIME 12/06/21   Tat, Rebecca S, DO  fluticasone (FLONASE) 50 MCG/ACT nasal spray Place 2 sprays into both nostrils daily. --- Office visit needed for further refills 12/09/16   Binnie Rail, MD  nitroGLYCERIN (NITROSTAT) 0.4 MG SL tablet Place 1 tablet (0.4 mg total) under the tongue every 5  (five) minutes as needed for chest pain. 03/27/16   Sherren Mocha, MD  pantoprazole (PROTONIX) 20 MG tablet Take 1 tablet by mouth daily. 03/17/17   [provider]  QUEtiapine (SEROQUEL) 25 MG tablet Take 0.5 tablets (12.5 mg total) by mouth at bedtime. 10/07/21   Tat, Eustace Quail, DO      Allergies    Hydromorphone hcl, Phenytoin, Zocor [simvastatin - high dose], Crestor [rosuvastatin calcium], Hydromorphone hcl, and Statins    Review of Systems   Review of Systems  Physical Exam Updated Vital Signs BP (!) 156/58    Pulse (!) 49    Temp 98.6 F (37 C) (Oral)    Resp 15    Ht 5\' 6"  (1.676 m)    Wt 63.5 kg    SpO2 100%    BMI 22.60 kg/m   Physical Exam Vitals and nursing note reviewed.  Constitutional:      Appearance: He is well-developed.  HENT:     Head: Normocephalic. No raccoon eyes or Battle's sign.     Comments: 6 cm posterior scalp laceration in the right occipital region.  Wound base explored, along the posterior aspect that extends to the skull, otherwise extends into the subcutaneous tissue only.  Of note, at the area of questionable age-indeterminate area on head CT, there is no overlying swelling, tenderness, bruising.  Patient has full range of motion of his jaw without  any difficulty and is talking normally.    Right Ear: Tympanic membrane, ear canal and external ear normal. No hemotympanum.     Left Ear: Tympanic membrane, ear canal and external ear normal. No hemotympanum.     Nose: Nose normal.  Eyes:     General: Lids are normal.     Conjunctiva/sclera: Conjunctivae normal.     Pupils: Pupils are equal, round, and reactive to light.     Comments: No visible hyphema  Neck:     Comments: Immobilized in cervical collar. Cardiovascular:     Rate and Rhythm: Normal rate and regular rhythm.  Pulmonary:     Effort: Pulmonary effort is normal.     Breath sounds: Normal breath sounds.  Abdominal:     Palpations: Abdomen is soft.     Tenderness: There is no  abdominal tenderness.  Musculoskeletal:        General: Normal range of motion.     Cervical back: Normal range of motion. No tenderness or bony tenderness.     Thoracic back: No tenderness or bony tenderness.     Lumbar back: No tenderness or bony tenderness.     Comments: Pelvis stable.  Patient with normal active/passive range of motion of the upper and lower extremities bilaterally.  No contusions or bruising over the chest or abdomen.  Skin:    General: Skin is warm and dry.  Neurological:     Mental Status: He is alert. Mental status is at baseline. He is disoriented and confused.     GCS: GCS eye subscore is 4. GCS verbal subscore is 5. GCS motor subscore is 6.     Cranial Nerves: No dysarthria or facial asymmetry.     Motor: No weakness.     Comments: Patient disoriented at baseline.  Limited neuro exam due to patient's inability to follow directions and participate in the exam.  He does seem to talk more and is more oriented after his daughter arrives.  He knows his daughter.     ED Results / Procedures / Treatments   Labs (all labs ordered are listed, but only abnormal results are displayed) Labs Reviewed  I-STAT CHEM 8, ED - Abnormal; Notable for the following components:      Result Value   BUN 26 (*)    Glucose, Bld 120 (*)    Hemoglobin 12.2 (*)    HCT 36.0 (*)    All other components within normal limits    EKG None  Radiology CT Head Wo Contrast  Result Date: 01/07/2022 CLINICAL DATA:  Head trauma fall EXAM: CT HEAD WITHOUT CONTRAST CT CERVICAL SPINE WITHOUT CONTRAST TECHNIQUE: Multidetector CT imaging of the head and cervical spine was performed following the standard protocol without intravenous contrast. Multiplanar CT image reconstructions of the cervical spine were also generated. RADIATION DOSE REDUCTION: This exam was performed according to the departmental dose-optimization program which includes automated exposure control, adjustment of the mA and/or kV  according to patient size and/or use of iterative reconstruction technique. COMPARISON:  CT brain and cervical spine 07/15/2021 FINDINGS: CT HEAD FINDINGS Brain: No acute territorial infarction, hemorrhage, or intracranial mass is visualized. Mild atrophy. Extensive white matter hypodensity likely due to chronic small vessel ischemic change. Stable ventricle size. Vascular: No hyperdense vessels.  Carotid vascular calcification. Skull: No depressed skull fracture. Possible age indeterminate linear fracture involving the right posterior temporal bone, along the anterior aspect of the right mandibular fossa. Sinuses/Orbits: No acute finding. Other: None CT CERVICAL  SPINE FINDINGS Alignment: Similar trace anterolisthesis C4 on C5, C5 on C6 and C6 on C7. Trace retrolisthesis C3 on C4. Facet alignment within normal limits. Skull base and vertebrae: No acute fracture. Vertebral body heights are maintained. Chronic lucency through the left transverse foramen at C1. Soft tissues and spinal canal: No prevertebral fluid or swelling. No visible canal hematoma. Disc levels: Partial fusion of the C2 and C3 vertebral bodies. Multilevel degenerative changes with moderate severe disc space narrowing C3-C4, C6-C7 and C7-T1. Facet degenerative changes at multiple levels with foraminal narrowing. Upper chest: Negative. Other: None IMPRESSION: 1. No definite CT evidence for acute intracranial abnormality. Atrophy and chronic small vessel ischemic changes of the white matter. 2. Possible age indeterminate nondisplaced fracture involving the right temporal bone/skull base at the anterior mandibular fossa. 3. Similar alignment of the cervical spine with degenerative changes. No definite acute osseous abnormality. Electronically Signed   By: Donavan Foil M.D.   On: 01/07/2022 16:37   CT Cervical Spine Wo Contrast  Result Date: 01/07/2022 CLINICAL DATA:  Head trauma fall EXAM: CT HEAD WITHOUT CONTRAST CT CERVICAL SPINE WITHOUT CONTRAST  TECHNIQUE: Multidetector CT imaging of the head and cervical spine was performed following the standard protocol without intravenous contrast. Multiplanar CT image reconstructions of the cervical spine were also generated. RADIATION DOSE REDUCTION: This exam was performed according to the departmental dose-optimization program which includes automated exposure control, adjustment of the mA and/or kV according to patient size and/or use of iterative reconstruction technique. COMPARISON:  CT brain and cervical spine 07/15/2021 FINDINGS: CT HEAD FINDINGS Brain: No acute territorial infarction, hemorrhage, or intracranial mass is visualized. Mild atrophy. Extensive white matter hypodensity likely due to chronic small vessel ischemic change. Stable ventricle size. Vascular: No hyperdense vessels.  Carotid vascular calcification. Skull: No depressed skull fracture. Possible age indeterminate linear fracture involving the right posterior temporal bone, along the anterior aspect of the right mandibular fossa. Sinuses/Orbits: No acute finding. Other: None CT CERVICAL SPINE FINDINGS Alignment: Similar trace anterolisthesis C4 on C5, C5 on C6 and C6 on C7. Trace retrolisthesis C3 on C4. Facet alignment within normal limits. Skull base and vertebrae: No acute fracture. Vertebral body heights are maintained. Chronic lucency through the left transverse foramen at C1. Soft tissues and spinal canal: No prevertebral fluid or swelling. No visible canal hematoma. Disc levels: Partial fusion of the C2 and C3 vertebral bodies. Multilevel degenerative changes with moderate severe disc space narrowing C3-C4, C6-C7 and C7-T1. Facet degenerative changes at multiple levels with foraminal narrowing. Upper chest: Negative. Other: None IMPRESSION: 1. No definite CT evidence for acute intracranial abnormality. Atrophy and chronic small vessel ischemic changes of the white matter. 2. Possible age indeterminate nondisplaced fracture involving the  right temporal bone/skull base at the anterior mandibular fossa. 3. Similar alignment of the cervical spine with degenerative changes. No definite acute osseous abnormality. Electronically Signed   By: Donavan Foil M.D.   On: 01/07/2022 16:37   DG Pelvis Portable  Result Date: 01/07/2022 CLINICAL DATA:  Fall. Patient was helped to the floor. EXAM: PORTABLE PELVIS 1-2 VIEWS COMPARISON:  None. FINDINGS: There is no evidence of pelvic fracture or diastasis. No pelvic bone lesions are seen. Vascular calcifications noted. IMPRESSION: 1. No acute abnormality. 2. Atherosclerosis. Electronically Signed   By: San Morelle M.D.   On: 01/07/2022 15:59   DG Chest Port 1 View  Result Date: 01/07/2022 CLINICAL DATA:  Fall.  Patient was assisted to the floor. EXAM: PORTABLE CHEST 1 VIEW  COMPARISON:  One-view chest x-ray 10/31/2020 FINDINGS: Heart size is normal. Coronary stents are in place. No edema or effusion is present. The lungs are clear. Axial skeleton is unremarkable. IMPRESSION: No active disease. Electronically Signed   By: San Morelle M.D.   On: 01/07/2022 15:56    Procedures .Marland KitchenLaceration Repair  Date/Time: 01/07/2022 5:38 PM Performed by: Carlisle Cater, PA-C Authorized by: Carlisle Cater, PA-C   Consent:    Consent obtained:  Emergent situation   Risks discussed:  Pain   Alternatives discussed:  No treatment Universal protocol:    Patient identity confirmed:  Arm band and provided demographic data Anesthesia:    Anesthesia method:  Local infiltration   Local anesthetic:  Lidocaine 2% WITH epi Laceration details:    Location:  Scalp   Scalp location:  Occipital   Length (cm):  6 Pre-procedure details:    Preparation:  Patient was prepped and draped in usual sterile fashion and imaging obtained to evaluate for foreign bodies Exploration:    Imaging outcome: foreign body not noted     Wound exploration: wound explored through full range of motion and entire depth of  wound visualized     Wound extent: no foreign bodies/material noted     Contaminated: no   Treatment:    Area cleansed with:  Saline   Amount of cleaning:  Extensive Skin repair:    Repair method:  Staples   Number of staples:  8 Approximation:    Approximation:  Close Repair type:    Repair type:  Simple Post-procedure details:    Dressing:  Bulky dressing   Procedure completion:  Tolerated well, no immediate complications    Medications Ordered in ED Medications  iohexol (OMNIPAQUE) 350 MG/ML injection 100 mL (has no administration in time range)  lidocaine-EPINEPHrine (XYLOCAINE W/EPI) 2 %-1:200000 (PF) injection 20 mL (20 mLs Infiltration Given 01/07/22 1612)    ED Course/ Medical Decision Making/ A&P    Patient seen and examined. History obtained from EMS at bedside.   Labs/EKG: Istat chem 8 ordered.   Imaging: Ordered portable chest, pelvis, CT head and cervical spine.  Medications/Fluids: None ordered  Most recent vital signs reviewed and are as follows: BP (!) 129/51    Pulse (!) 58    Temp 98.6 F (37 C) (Oral)    Resp 17    Ht 5\' 6"  (1.676 m)    Wt 63.5 kg    SpO2 100%    BMI 22.60 kg/m   Initial impression: Head injury, scalp laceration  5:38 PM Reassessment performed. Patient appears baseline.  Patient's daughter arrived prior to start of wound repair.  She confirms that patient is at his baseline mental status.  Confirm no concerns about his recent health.  He has a mechanical fall, nearly daily.  Labs and imaging personally reviewed and interpreted including: I-STAT Chem-8 showing normal kidney function, hemoglobin 12.2, glucose 120.  Reviewed head CT and cervical spine CT.  Age indeterminant temporal bone fracture noted.  This does not correspond with any signs of external trauma and do not feel that this is an acute problem today.  Discussed this with daughter.  Reviewed x-ray of the chest and pelvis, agree no obvious acute injuries.  Most current  vital signs reviewed and are as follows: BP (!) 129/51    Pulse (!) 58    Temp 98.6 F (37 C) (Oral)    Resp 17    Ht 5\' 6"  (1.676 m)    Wt 63.5  kg    SpO2 100%    BMI 22.60 kg/m   Plan: Discharged home.  Daughter will take patient home.  Home treatment: None  Patient's daughter counseled on wound care. Patient counseled on need to return or see PCP/urgent care for suture removal in 7-10 days. Patient was urged to return to the Emergency Department urgently with worsening pain, swelling, expanding erythema especially if it streaks away from the affected area, fever, or if they have any other concerns. Patient verbalized understanding.   Patient was counseled on head injury precautions and symptoms that should indicate their return to the ED.  These include severe worsening headache, vision changes, confusion, loss of consciousness, trouble walking, nausea & vomiting, or weakness/tingling in extremities.                             Medical Decision Making Amount and/or Complexity of Data Reviewed Radiology: ordered.  Risk Prescription drug management.   Patient with head injury on antiplatelet.  CT of the head, cervical spine, x-ray of the chest and pelvis were reassuring from an acute injury standpoint.  Wound was cleaned and repaired as above.  Patient is at his mental status baseline.  He has a supportive family.  No immediate concerns regarding recent health.  Electrolytes, kidney function, red blood cell count looks okay.  Plan for discharge to home.        Final Clinical Impression(s) / ED Diagnoses Final diagnoses:  Laceration of scalp, initial encounter  Fall, initial encounter  Injury of head, initial encounter    Rx / DC Orders ED Discharge Orders     None         Carlisle Cater, PA-C 01/07/22 1742    Gareth Morgan, MD 01/08/22 1455

## 2022-01-07 NOTE — ED Triage Notes (Addendum)
Pt here via EMS from [facility] d/t a fall. Pt went to sit down, lost balance, fell backwards and stuck head on door. Laceration to back of head. Bleeding controlled. Pt on Plavix. Pt demented at baseline.

## 2022-01-07 NOTE — Discharge Instructions (Signed)
Please read and follow all provided instructions.  Your diagnoses today include:  1. Laceration of scalp, initial encounter   2. Fall, initial encounter   3. Injury of head, initial encounter     Tests performed today include: CT scan of your head and neck that did not show any serious injury. X-ray of the chest and pelvis: No fractures or other significant injury Hemoglobin and electrolytes: No major problems, slightly low hemoglobin at 12.2 Vital signs. See below for your results today.   Medications prescribed:  None  Take any prescribed medications only as directed.  Home care instructions:  Follow any educational materials contained in this packet.  BE VERY CAREFUL not to take multiple medicines containing Tylenol (also called acetaminophen). Doing so can lead to an overdose which can damage your liver and cause liver failure and possibly death.   Follow-up instructions: Please follow-up with your primary care provider in 7-10 days for removal of the staples.    Return instructions:  SEEK IMMEDIATE MEDICAL ATTENTION IF: There is confusion or drowsiness (although children frequently become drowsy after injury).  You cannot awaken the injured person.  You have more than one episode of vomiting.  You notice dizziness or unsteadiness which is getting worse, or inability to walk.  You have convulsions or unconsciousness.  You experience severe, persistent headaches not relieved by Tylenol. You cannot use arms or legs normally.  There are changes in pupil sizes. (This is the black center in the colored part of the eye)  There is clear or bloody discharge from the nose or ears.  You have change in speech, vision, swallowing, or understanding.  Localized weakness, numbness, tingling, or change in bowel or bladder control. You have any other emergent concerns.  Additional Information: You have had a head injury which does not appear to require admission at this time.  Your  vital signs today were: BP (!) 156/58    Pulse (!) 49    Temp 98.6 F (37 C) (Oral)    Resp 15    Ht 5\' 6"  (1.676 m)    Wt 63.5 kg    SpO2 100%    BMI 22.60 kg/m  If your blood pressure (BP) was elevated above 135/85 this visit, please have this repeated by your doctor within one month. --------------

## 2022-01-07 NOTE — ED Notes (Signed)
Patient transported to CT 

## 2022-01-13 DIAGNOSIS — I10 Essential (primary) hypertension: Secondary | ICD-10-CM | POA: Diagnosis not present

## 2022-01-13 DIAGNOSIS — W01198A Fall on same level from slipping, tripping and stumbling with subsequent striking against other object, initial encounter: Secondary | ICD-10-CM | POA: Diagnosis not present

## 2022-01-13 DIAGNOSIS — S0101XA Laceration without foreign body of scalp, initial encounter: Secondary | ICD-10-CM | POA: Diagnosis not present

## 2022-01-13 DIAGNOSIS — G301 Alzheimer's disease with late onset: Secondary | ICD-10-CM | POA: Diagnosis not present

## 2022-01-13 DIAGNOSIS — F02B0 Dementia in other diseases classified elsewhere, moderate, without behavioral disturbance, psychotic disturbance, mood disturbance, and anxiety: Secondary | ICD-10-CM | POA: Diagnosis not present

## 2022-01-20 DIAGNOSIS — F02B0 Dementia in other diseases classified elsewhere, moderate, without behavioral disturbance, psychotic disturbance, mood disturbance, and anxiety: Secondary | ICD-10-CM | POA: Diagnosis not present

## 2022-01-20 DIAGNOSIS — I10 Essential (primary) hypertension: Secondary | ICD-10-CM | POA: Diagnosis not present

## 2022-01-20 DIAGNOSIS — G301 Alzheimer's disease with late onset: Secondary | ICD-10-CM | POA: Diagnosis not present

## 2022-01-20 DIAGNOSIS — W01198D Fall on same level from slipping, tripping and stumbling with subsequent striking against other object, subsequent encounter: Secondary | ICD-10-CM | POA: Diagnosis not present

## 2022-01-20 DIAGNOSIS — S0101XD Laceration without foreign body of scalp, subsequent encounter: Secondary | ICD-10-CM | POA: Diagnosis not present

## 2022-01-20 DIAGNOSIS — Z4802 Encounter for removal of sutures: Secondary | ICD-10-CM | POA: Diagnosis not present

## 2022-01-20 DIAGNOSIS — G20 Parkinson's disease: Secondary | ICD-10-CM | POA: Diagnosis not present

## 2022-01-29 DIAGNOSIS — W010XXD Fall on same level from slipping, tripping and stumbling without subsequent striking against object, subsequent encounter: Secondary | ICD-10-CM | POA: Diagnosis not present

## 2022-01-29 DIAGNOSIS — S0101XD Laceration without foreign body of scalp, subsequent encounter: Secondary | ICD-10-CM | POA: Diagnosis not present

## 2022-01-29 DIAGNOSIS — I10 Essential (primary) hypertension: Secondary | ICD-10-CM | POA: Diagnosis not present

## 2022-01-29 DIAGNOSIS — F02B0 Dementia in other diseases classified elsewhere, moderate, without behavioral disturbance, psychotic disturbance, mood disturbance, and anxiety: Secondary | ICD-10-CM | POA: Diagnosis not present

## 2022-01-29 DIAGNOSIS — G20 Parkinson's disease: Secondary | ICD-10-CM | POA: Diagnosis not present

## 2022-02-10 DIAGNOSIS — W010XXA Fall on same level from slipping, tripping and stumbling without subsequent striking against object, initial encounter: Secondary | ICD-10-CM | POA: Diagnosis not present

## 2022-02-10 DIAGNOSIS — G20 Parkinson's disease: Secondary | ICD-10-CM | POA: Diagnosis not present

## 2022-02-10 DIAGNOSIS — I10 Essential (primary) hypertension: Secondary | ICD-10-CM | POA: Diagnosis not present

## 2022-02-10 DIAGNOSIS — G301 Alzheimer's disease with late onset: Secondary | ICD-10-CM | POA: Diagnosis not present

## 2022-02-10 DIAGNOSIS — F02B0 Dementia in other diseases classified elsewhere, moderate, without behavioral disturbance, psychotic disturbance, mood disturbance, and anxiety: Secondary | ICD-10-CM | POA: Diagnosis not present

## 2022-02-12 ENCOUNTER — Ambulatory Visit: Payer: Medicare Other | Admitting: Neurology

## 2022-02-12 ENCOUNTER — Encounter: Payer: Self-pay | Admitting: Neurology

## 2022-02-14 DIAGNOSIS — E785 Hyperlipidemia, unspecified: Secondary | ICD-10-CM | POA: Diagnosis not present

## 2022-02-14 DIAGNOSIS — E46 Unspecified protein-calorie malnutrition: Secondary | ICD-10-CM | POA: Diagnosis not present

## 2022-02-17 DIAGNOSIS — K219 Gastro-esophageal reflux disease without esophagitis: Secondary | ICD-10-CM | POA: Diagnosis not present

## 2022-02-17 DIAGNOSIS — I10 Essential (primary) hypertension: Secondary | ICD-10-CM | POA: Diagnosis not present

## 2022-02-17 DIAGNOSIS — G20 Parkinson's disease: Secondary | ICD-10-CM | POA: Diagnosis not present

## 2022-02-17 DIAGNOSIS — R296 Repeated falls: Secondary | ICD-10-CM | POA: Diagnosis not present

## 2022-02-17 DIAGNOSIS — G301 Alzheimer's disease with late onset: Secondary | ICD-10-CM | POA: Diagnosis not present

## 2022-02-17 DIAGNOSIS — F02B0 Dementia in other diseases classified elsewhere, moderate, without behavioral disturbance, psychotic disturbance, mood disturbance, and anxiety: Secondary | ICD-10-CM | POA: Diagnosis not present

## 2022-02-19 ENCOUNTER — Emergency Department (HOSPITAL_COMMUNITY): Payer: Medicare Other

## 2022-02-19 ENCOUNTER — Emergency Department (HOSPITAL_COMMUNITY)
Admission: EM | Admit: 2022-02-19 | Discharge: 2022-02-19 | Disposition: A | Discharge status: Home / Self Care | Payer: Medicare Other | Attending: Emergency Medicine | Admitting: Emergency Medicine

## 2022-02-19 ENCOUNTER — Other Ambulatory Visit: Payer: Self-pay

## 2022-02-19 ENCOUNTER — Encounter (HOSPITAL_COMMUNITY): Payer: Self-pay

## 2022-02-19 DIAGNOSIS — R638 Other symptoms and signs concerning food and fluid intake: Secondary | ICD-10-CM

## 2022-02-19 DIAGNOSIS — Z711 Person with feared health complaint in whom no diagnosis is made: Secondary | ICD-10-CM | POA: Diagnosis not present

## 2022-02-19 DIAGNOSIS — S0101XA Laceration without foreign body of scalp, initial encounter: Secondary | ICD-10-CM | POA: Diagnosis not present

## 2022-02-19 DIAGNOSIS — R7989 Other specified abnormal findings of blood chemistry: Secondary | ICD-10-CM | POA: Diagnosis not present

## 2022-02-19 DIAGNOSIS — F0280 Dementia in other diseases classified elsewhere without behavioral disturbance: Secondary | ICD-10-CM | POA: Diagnosis not present

## 2022-02-19 DIAGNOSIS — R531 Weakness: Secondary | ICD-10-CM | POA: Insufficient documentation

## 2022-02-19 DIAGNOSIS — Z20822 Contact with and (suspected) exposure to covid-19: Secondary | ICD-10-CM | POA: Insufficient documentation

## 2022-02-19 DIAGNOSIS — Z66 Do not resuscitate: Secondary | ICD-10-CM

## 2022-02-19 DIAGNOSIS — R001 Bradycardia, unspecified: Secondary | ICD-10-CM | POA: Diagnosis not present

## 2022-02-19 DIAGNOSIS — I959 Hypotension, unspecified: Secondary | ICD-10-CM | POA: Diagnosis not present

## 2022-02-19 DIAGNOSIS — G309 Alzheimer's disease, unspecified: Secondary | ICD-10-CM | POA: Diagnosis not present

## 2022-02-19 DIAGNOSIS — S0990XA Unspecified injury of head, initial encounter: Secondary | ICD-10-CM | POA: Diagnosis not present

## 2022-02-19 DIAGNOSIS — I441 Atrioventricular block, second degree: Secondary | ICD-10-CM

## 2022-02-19 DIAGNOSIS — Z0389 Encounter for observation for other suspected diseases and conditions ruled out: Secondary | ICD-10-CM | POA: Diagnosis not present

## 2022-02-19 DIAGNOSIS — Z515 Encounter for palliative care: Secondary | ICD-10-CM | POA: Diagnosis not present

## 2022-02-19 DIAGNOSIS — Z789 Other specified health status: Secondary | ICD-10-CM | POA: Diagnosis not present

## 2022-02-19 DIAGNOSIS — Z7189 Other specified counseling: Secondary | ICD-10-CM

## 2022-02-19 LAB — BASIC METABOLIC PANEL
Anion gap: 5 (ref 5–15)
BUN: 26 mg/dL — ABNORMAL HIGH (ref 8–23)
CO2: 26 mmol/L (ref 22–32)
Calcium: 8.6 mg/dL — ABNORMAL LOW (ref 8.9–10.3)
Chloride: 110 mmol/L (ref 98–111)
Creatinine, Ser: 1.29 mg/dL — ABNORMAL HIGH (ref 0.61–1.24)
GFR, Estimated: 52 mL/min — ABNORMAL LOW (ref >60)
Glucose, Bld: 98 mg/dL (ref 70–99)
Potassium: 4.6 mmol/L (ref 3.5–5.1)
Sodium: 141 mmol/L (ref 135–145)

## 2022-02-19 LAB — CBC
HCT: 38.2 % — ABNORMAL LOW (ref 39.0–52.0)
Hemoglobin: 12.3 g/dL — ABNORMAL LOW (ref 13.0–17.0)
MCH: 33.2 pg (ref 26.0–34.0)
MCHC: 32.2 g/dL (ref 30.0–36.0)
MCV: 103 fL — ABNORMAL HIGH (ref 80.0–100.0)
Platelets: 183 10*3/uL (ref 150–400)
RBC: 3.71 MIL/uL — ABNORMAL LOW (ref 4.22–5.81)
RDW: 13.1 % (ref 11.5–15.5)
WBC: 9.6 10*3/uL (ref 4.0–10.5)
nRBC: 0 % (ref 0.0–0.2)

## 2022-02-19 LAB — RESP PANEL BY RT-PCR (FLU A&B, COVID) ARPGX2
Influenza A by PCR: NEGATIVE
Influenza B by PCR: NEGATIVE
SARS Coronavirus 2 by RT PCR: NEGATIVE

## 2022-02-19 LAB — TROPONIN I (HIGH SENSITIVITY): Troponin I (High Sensitivity): 15 ng/L (ref <18)

## 2022-02-19 LAB — MAGNESIUM: Magnesium: 2.1 mg/dL (ref 1.7–2.4)

## 2022-02-19 NOTE — ED Notes (Signed)
Pt's son is at bedside getting him dressed & ready to leave for d/c. ?

## 2022-02-19 NOTE — ED Provider Notes (Signed)
Blood pressure (!) 179/58, pulse (!) 42, temperature 97.6 ?F (36.4 ?C), temperature source Oral, resp. rate 16, SpO2 99 %. ? ?Assuming care from Dr. Langston Masker.  In short, [name] [name] is a 88 y.o. male with a chief complaint of Weakness and Bradycardia ?Marland Kitchen  Refer to the original H&P for additional details. ? ?The current plan of care is to follow up on palliative and case mgmt consultation. Likely d/c with palliative care to SNF. ? ?04:42 PM ?Spoke with Palliative care. Plan for d/c to facility. They will coordinate with CM for hospice referral.  ? ?  ?Margette Fast, MD ?02/21/22 1144 ? ?

## 2022-02-19 NOTE — ED Triage Notes (Signed)
Pt BIB [facility] from [facility] d/t increased weakness over the past week & EMS reports he is Bradycardic while en route. Parkinson's & Dementia at baseline. GCS 14 d/t baseline confusion. EMS relays that he was taken off of his lasix recently & they started a 18g PIV in his Rt FA. 114/60, 35 bpm, 98% O2 on RA. ?

## 2022-02-19 NOTE — Progress Notes (Signed)
Transition of Care [facility]) - Emergency Department Mini Assessment ? ? ?Patient Details  ?Name: [name] [name] ?MRN: [id number] ?Date of Birth: 06-14-1929 ? ?Transition of Care (TOC) CM/SW Contact:    ?Laurena Slimmer, RN ?Phone Number: ?02/19/2022, 10:47 PM ? ? ?Clinical Narrative: ?Patient seen by Palliative NP discussed the Hospice Supportive Care services. Patient and ?Son are agreeable to [facility] at the facility.  Referral sent to [facility] confirmation of receipt intake will follow up with patient and family tomorrow.  Updated EDP. ?ED Mini Assessment: ?What brought you to the Emergency Department? : (Pended) weakness ? ?  ? ?  ? ?Means of departure: (Pended) Car (transported back to ALF with family) ? ?Interventions which prevented an admission or readmission: (Pended) Hospice ? ? ? ?Patient Contact and Communications ?  ?  ?  ? ,     ?  ?  ? ?  ?  ?Choice offered to / list presented to : (Pended) Adult Children ? ?Admission diagnosis:  Wayne Sever ?Patient Active Problem List  ? Diagnosis Date Noted  ? 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 Parkinson's disease ([place]) 02/16/2018  ? Early dry stage nonexudative age-related macular degeneration of both eyes 07/21/2017  ? Hypertensive retinopathy of both eyes 07/21/2017  ? Pseudophakia, both eyes 07/21/2017  ? Rash 02/12/2017  ? Ankle swelling, right 02/12/2017  ? 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 Essential hypertension, benign 06/16/2012  ? CAD (coronary artery disease) 05/27/2011  ? Bladder cancer ([place]) 05/27/2011  ? Osteopenia 05/27/2011  ? Elevated prostate specific antigen (PSA) 03/28/2008  ? Hyperlipidemia 01/31/2008  ? Unspecified hyperplasia of prostate without urinary obstruction and other lower urinary tract symptoms (LUTS) 01/31/2008  ? Impotence of organic origin 01/31/2008  ? Asthma 09/28/2007  ? GERD 07/22/2007  ? ?PCP:  Wenda Low, MD ?Pharmacy:    ?[facility] - [place], [place] - [address] AT [address] ?[address] ?[place] [address] ?Phone: [phone] Fax: [phone] ?  ?

## 2022-02-19 NOTE — Discharge Planning (Signed)
RNCM following for disposition needs. Awaiting palliative consult. ?Tranika Scholler J. Clydene Laming, [place], [place], [place] [phone] ? ?

## 2022-02-19 NOTE — Consult Note (Addendum)
?Consultation Note ?Date: 02/19/2022  ? ?Patient Name: [name] [name]  ?DOB: 1929-06-06  MRN: [id number]  Age / Sex: 88 y.o., male  ?PCP: Wenda Low, MD ?Referring Physician: Wyvonnia Dusky, MD ? ?Reason for Consultation: Establishing goals of care, "failure to thrive; worsening dementia & parkinson's - family wants to prioritize quality of life and avoid hospitalization" ? ?HPI/Patient Profile: 88 y.o. male  with past medical history of Alzheimers/Dementia, CAD, HTN, GERD, and Parkinson's disease presented to ED on 02/19/22 from [facility] due to generalized  weakness and home care nurse found bradycardic. Patient was evaluated by cardiology who feel he is not a good candidate for invasive procedures, pacemaker is unlikely to change his prognostic course, and complication from pacing is higher than the likelihood of any benefit, especially any prolonged benefit. After discussions with Cardiology and EDP, family are hopeful to prevent rehospitalizations in the future. He has had 2 ED visits in the last 6 months. ? ? ?Clinical Assessment and Goals of Care: ?I have reviewed medical records including EPIC notes, labs, and imaging. MOST form reviewed from ACP tab. Received report from primary RN - no acute concerns.  ? ?Went to visit patient at bedside - son/Torrell present. Patient was lying in bed awake, alert, disoriented, and unable to participate in conversation. His speech is mumbled and unintelligible. No signs or non-verbal gestures of pain or discomfort noted. No respiratory distress, increased work of breathing, or secretions noted.  ? ?Met with son/Truong  to discuss diagnosis, prognosis, GOC, EOL wishes, disposition, and options. ? ?I introduced Palliative Medicine as specialized medical care for people living with serious illness. It focuses on providing relief from the symptoms and stress of a serious illness. The  goal is to improve quality of life for both the patient and the family. ? ?We discussed a brief life review of the patient as well as functional and nutritional status. Patient is married - they have two children, one son and one daughter. Prior to hospitalization, patient was living in [facility] assisted living facility with his wife; however, he realistically would benefit from LTC but wife wants to keep him with her. At [facility], staff would check in every 2 hours, he had continued care from 4-9p and 11p-7a. Harless reports patient's functional status wax and wanes, but overall has had a gradual decline since last year around Labor Day, when he was still walking.  Currently, he is now bedbound. As of last weekend, patient is no longer able to feed himself and has been increasingly lethargic. Hovanes reports patient had "a good appetite until last weekend."  ? ?We discussed patient's current illness and what it means in the larger context of patient's on-going co-morbidities. Dehaven has a good understanding of patient's current acute medical situation. Patient's history is significant for gradually worsening progressive symptoms of dementia and reviewed concern over patient's incremental decline in functional status which are in line with dementia disease trajectory ? Natural disease trajectory and expectations at EOL were discussed. I attempted to elicit values and goals of care important to the patient. The difference between aggressive medical intervention and comfort care was considered in light of the patient's goals of care. Christophe is clear that he and his sister's goal is to not have patient rehospitalized or to pursue invasive procedures.  ? ?Provided education and counseling at length on the philosophy and benefits of hospice care. Discussed that it offers a holistic approach to care in the setting of end-stage  illness, and is about supporting the patient where they are allowing nature to take  it's course. Discussed the hospice team includes RNs, physicians, social workers, and chaplains. They can provide personal care, support for the family, and help keep patient out of the hospital as well as assist with DME needs for home hospice. Education provided on the difference between home vs residential hospice. Reviewed hospice benefit in context of insurance to the best of my ability. After discussion, Anders does feel hospice supports family/patient's goals. He would like to discuss with his sister who is primary HCPOA. ? ?Called patient's daughter/Laura. Reviewed hospice information as above. She is agreeable to hospice services at discharge. ? ?Discussed discharge from ED, without admission, back home with goal to get hospice referral started - family agreeable. Reviewed that soon, patient may need LTC as it is anticipated he will continue to decline. ? ?Advance directives, concepts specific to code status, artificial feeding and hydration, and rehospitalization were considered and discussed. No changes to MOST form. DNR/DNI confirmed. ? ?Discussed with family the importance of continued conversation with each other and the medical providers regarding overall plan of care and treatment options, ensuring decisions are within the context of the patient?s values and GOCs.   ? ?Questions and concerns were addressed. The patient/family was encouraged to call with questions and/or concerns. PMT card was provided. ? ?Discussed case with EDP and TOC. ? ?Primary Decision Maker: ?NEXT OF KIN - son and daughter both HCPOA  ?  ? ?SUMMARY OF RECOMMENDATIONS   ?No invasive procedures ?Discharge from ED back home with hospice ?TOC notified and consulted for: home hospice referral ?PMT will continue to follow peripherally. If there are any imminent needs please call the service directly ? ? ?Code Status/Advance Care Planning: ?DNR ? ?Palliative Prophylaxis:  ?Aspiration, Bowel Regimen, Delirium Protocol, Frequent Pain  Assessment, Oral Care, and Turn Reposition ? ?Additional Recommendations (Limitations, Scope, Preferences): ?Avoid Hospitalization, Full Comfort Care, No Artificial Feeding, No Surgical Procedures, and No Tracheostomy ? ?Psycho-social/Spiritual:  ?Desire for further Chaplaincy support:no ?Created space and opportunity for patient and family to express thoughts and feelings regarding patient's current medical situation.  ?Emotional support and therapeutic listening provided. ? ?Prognosis:  ?< 6 months ? ?Discharge Planning: [place] with Hospice  ? ?  ? ?Primary Diagnoses: ?Present on Admission: ?**None** ? ? ?I have reviewed the medical record, interviewed the patient and family, and examined the patient. The following aspects are pertinent. ? ?Past Medical History:  ?Diagnosis Date  ? Asthma   ? Bladder cancer ([place])   ? CAD (coronary artery disease)   ? a. s/p Cypher DES x 2 to prox LAD and Cypher DES x 1 to distal LAD 08/2009;  b. cath with residual D2 50% 10/05 and normal EF;  c. Myoview 6/11: EF 60%, mild fixed INF, inferoseptal, inferoapical thinning, no ischemia, low risk, EF 60%  ? Colon polyps   ? Essential hypertension, benign 06/16/2012  ? GERD (gastroesophageal reflux disease)   ? HLD (hyperlipidemia)   ? h/o elevated LFTs on Zocor  ? Nephrolithiasis   ? Other abnormal glucose 2012  ? FBS 104  ? Parkinson's disease ([place]) 02/16/2018  ? Skin cancer, basal cell   ? Dr Sherrye Payor  ? ?Social History  ? ?Socioeconomic History  ? Marital status: Married  ?  Spouse name: Not on file  ? Number of children: 2  ? Years of education: Not on file  ? Highest education level: Bachelor's degree (e.g.,  BA, AB, BS)  ?Occupational History  ? Not on file  ?Tobacco Use  ? Smoking status: Former  ?  Types: Cigarettes  ?  Quit date: 11/17/1960  ?  Years since quitting: 61.2  ? Smokeless tobacco: Never  ? Tobacco comments:  ?  smoked 1948-1962, up to 1/2 ppd  ?Vaping Use  ? Vaping Use: Never used  ?Substance and Sexual  Activity  ? Alcohol use: Yes  ?  Alcohol/week: 10.0 standard drinks  ?  Types: 10 Glasses of wine per week  ?  Comment: wine @ dinner  ? Drug use: No  ? Sexual activity: Not on file  ?Other Topics Concern

## 2022-02-19 NOTE — ED Notes (Signed)
Pt's son has been updated about pt returning to the facility & to receive Hospice care moving forward & was reported to give the AVS papers to the nurse at the facility for their reference.  ?

## 2022-02-19 NOTE — ED Provider Notes (Signed)
?[facility] ?Provider Note ? ? ?CSN: [id number] ?Arrival date & time: 02/19/22  1232 ? ?  ? ?History ? ?Chief Complaint  ?Patient presents with  ? Weakness  ? Bradycardia  ? ? ?[name] [name] is a 88 y.o. male with history of Alzheimer's and dementia presenting from a nursing facility with concern for generalized weakness.  Patient is a poor historian and cannot provide further information on arrival.  EMS reports the patient was taken off of his Lasix recently, there was concern that he was bradycardic and seemed more tired than usual at his facility. ? ?HPI ? ?  ? ?Home Medications ?Prior to Admission medications   ?Medication Sig Start Date End Date Taking? Authorizing Provider  ?carbidopa-levodopa (SINEMET IR) 25-100 MG tablet TAKE 1 AND 1/2 TABLETS BY MOUTH THREE TIMES DAILY 12/05/21   Tat, Eustace Quail, DO  ?carbidopa-levodopa (SINEMET IR) 25-100 MG tablet TAKE 1 AND 1/2 TABLETS BY MOUTH THREE TIMES DAILY 12/05/21   Tat, Eustace Quail, DO  ?donepezil (ARICEPT) 10 MG tablet TAKE 1 TABLET(10 MG) BY MOUTH AT BEDTIME 12/06/21   Tat, Eustace Quail, DO  ?fluticasone (FLONASE) 50 MCG/ACT nasal spray Place 2 sprays into both nostrils daily. --- Office visit needed for further refills 12/09/16   Binnie Rail, MD  ?nitroGLYCERIN (NITROSTAT) 0.4 MG SL tablet Place 1 tablet (0.4 mg total) under the tongue every 5 (five) minutes as needed for chest pain. 03/27/16   Sherren Mocha, MD  ?pantoprazole (PROTONIX) 20 MG tablet Take 1 tablet by mouth daily. 03/17/17   [provider]  ?QUEtiapine (SEROQUEL) 25 MG tablet Take 0.5 tablets (12.5 mg total) by mouth at bedtime. 10/07/21   Ludwig Clarks, DO  ?   ? ?Allergies    ?Hydromorphone hcl, Phenytoin, Zocor [simvastatin - high dose], Crestor [rosuvastatin calcium], Hydromorphone hcl, and Statins   ? ?Review of Systems   ?Review of Systems ? ?Physical Exam ?Updated Vital Signs ?SpO2 98%  ?Physical Exam ?Constitutional:   ?   General: He is not  in acute distress. ?HENT:  ?   Head: Normocephalic and atraumatic.  ?Eyes:  ?   Conjunctiva/sclera: Conjunctivae normal.  ?   Pupils: Pupils are equal, round, and reactive to light.  ?Cardiovascular:  ?   Rate and Rhythm: Regular rhythm. Bradycardia present.  ?Pulmonary:  ?   Effort: Pulmonary effort is normal. No respiratory distress.  ?Abdominal:  ?   General: There is no distension.  ?   Tenderness: There is no abdominal tenderness.  ?Skin: ?   General: Skin is warm and dry.  ?Neurological:  ?   Mental Status: He is alert.  ?   Comments: Moving all extremities to command, GCS 15  ? ? ?ED Results / Procedures / Treatments   ?Labs ?(all labs ordered are listed, but only abnormal results are displayed) ?Labs Reviewed - No data to display ? ?EKG ?None ? ?Radiology ?No results found. ? ?Procedures ?Procedures  ? ? ?Medications Ordered in ED ?Medications - No data to display ? ?ED Course/ Medical Decision Making/ A&P ?Clinical Course as of 02/19/22 1636  ?Wed Feb 19, 2022  ?1328 HR still in 30-40 range [MT]  ?[facility] team consulted - spoke to [facility] [MT]  ?1413 EP team to see patient, requesting hospitalist admission [MT]  ?[id number] Cardiology team at the bedside and discussion with patient's son who is now present.  His son had reported to [place] that the patient has had an  extremely rapid decline in cognitive function and independence over the past 2 years, has been independent prior to [place] but has severely worsening dementia. His son reports that the patient's quality of living has become quite poor.  In lieu of this information, the EP team is not recommending surgery or pacemaker placement at this time, and had recommended set a palliative consult, which the family would be agreeable to.  We are awaiting blood work to check hydration status and electrolyte levels, but I anticipate the evaluation for palliative care or hospice could be performed as an outpatient. [MT]  ?1519 CM and Palliative care teams consulted  - awaiting lab results [MT]  ?  ?Clinical Course User Index ?[MT] Wyvonnia Dusky, MD  ? ?                        ?Medical Decision Making ?Amount and/or Complexity of Data Reviewed ?Labs: ordered. ?Radiology: ordered. ? ? ?This patient presents to the ED with concern for weakness. This involves an extensive number of treatment options, and is a complaint that carries with it a high risk of complications and morbidity.  The differential diagnosis includes anemia vs dehydration vs arrhythmia vs other ? ?Moving all extremities; no localizing symptoms to suggest stroke ? ?Co-morbidities that complicate the patient evaluation: dementia/parkinson's  ? ?Additional history obtained from patient's son at bedside, EMS ? ? ?I ordered and personally interpreted labs.  The pertinent results include:  no acute worsening anemia; CR mildly elevated here; trop unremarkable ? ?I ordered imaging studies including dg chest ?I independently visualized and interpreted imaging which showed no focal infiltrate ?I agree with the radiologist interpretation ? ?The patient was maintained on a cardiac monitor.  I personally viewed and interpreted the cardiac monitored which showed an underlying rhythm of: bradycardia ? ?Per my interpretation the patient's ECG shows heart block with bradycardia ? ? ?I requested consultation with the cardiology,  and discussed lab and imaging findings as well as pertinent plan - they recommend: no surgical intervention/pacemaker at this time per discussion with family members.  Plan for minimal intervention. ? ?After the interventions noted above, I reevaluated the patient and found that they have: stayed the same ? ?Social Determinants of Health:dementia/failure to thrive.  CM consult placed and palliative consult to facilitate palliative service as outpatient ? ?Dispostion: ? ?After consideration of the diagnostic results and the patients response to treatment, I feel that the patent would benefit from  palliative care consult. ? ? ? ? ? ? ? ? ?Final Clinical Impression(s) / ED Diagnoses ?Final diagnoses:  ?None  ? ? ?Rx / DC Orders ?ED Discharge Orders   ? ? None  ? ?  ? ? ?  ?Wyvonnia Dusky, MD ?02/19/22 1638 ? ?

## 2022-02-19 NOTE — Discharge Instructions (Signed)
You were seen in the emergency room today with low heart rate.  We will be discharging you to your nursing facility and our case manager will be initiating a referral to hospice.  ?

## 2022-02-19 NOTE — Consult Note (Addendum)
? ?ELECTROPHYSIOLOGY CONSULT  NOTE  ? ? ?Patient ID: [name] [name] ?MRN: [id number], DOB/AGE: October 18, 1929 88 y.o. ? ?Admit date: 02/19/2022 ?Date of Consult: 02/19/2022 ? ?Primary Physician: Wenda Low, MD ?Primary Cardiologist: Sherren Mocha, MD  ?Electrophysiologist:  New ? ?Referring Provider: Dr. Langston Masker ? ?Patient Profile: ?[name] [name] is a 88 y.o. male with a history of Alzheimers/Dementia, CAD, HTN, GERD, and Parkinsons who is being seen today for the evaluation of bradycardia at the request of Dr. Langston Masker. ? ?HPI:  ?[name] [name] is a 88 y.o. male with medical history as above.  ? ?He presents from nursing facility with concern for generalized weakness.  He is unable to provide any history due to the severity of his Alzheimers/Dementia.  EMS reports the patient was recently taken off lasix, also report of bradycardia over the past several days with elevated BP.  ? ?Spoke with his daughter [name] [name]) over the phone who states he has "ups and downs" with his alzheimer/dementia.  Low pulse and high BP since Sunday, but on and off. Decreased appetite / feeding himself less over past several months. His son states he had a very sudden mental status change approx 2-3 years ago, and has had gradual decline since.  Both brother and sister state they have been praying for a peaceful passing.  He is at a SNF now and requires nearly maximal assist during his "down" times, and still significant assistance during his "up" times.  ? ?Of note, pt had a head wound several months ago and had to have constant redirection to not "pick" at the site and bandage, including soft restraints.  ? ?Past Medical History:  ?Diagnosis Date  ? Asthma   ? Bladder cancer ([place])   ? CAD (coronary artery disease)   ? a. s/p Cypher DES x 2 to prox LAD and Cypher DES x 1 to distal LAD 08/2009;  b. cath with residual D2 50% 10/05 and normal EF;  c. Myoview 6/11: EF 60%, mild fixed INF, inferoseptal, inferoapical thinning, no ischemia, low  risk, EF 60%  ? Colon polyps   ? Essential hypertension, benign 06/16/2012  ? GERD (gastroesophageal reflux disease)   ? HLD (hyperlipidemia)   ? h/o elevated LFTs on Zocor  ? Nephrolithiasis   ? Other abnormal glucose 2012  ? FBS 104  ? Parkinson's disease ([place]) 02/16/2018  ? Skin cancer, basal cell   ? Dr Sherrye Payor  ?  ? ?Surgical History:  ?Past Surgical History:  ?Procedure Laterality Date  ? BLADDER TUMOR EXCISION  2011, 2012, 2013  ? Dr Estill Dooms, [place]  ? CATARACT EXTRACTION  2003  ? Bilateral, [facility]  ? Colonoscopy with polypectomy  2005  ? Dr. Verl Blalock  ? Coronary artery stenting  2005  ? Dr. Olevia Perches, 3 vessel  ? LITHOTRIPSY  2002  ? UPPER GASTROINTESTINAL ENDOSCOPY  2005  ? Esophageal reflux, hiatal hernia  ?  ? ?(Not in a hospital admission) ? ? ?Inpatient Medications:  ? ?Allergies:  ?Allergies  ?Allergen Reactions  ? Hydromorphone Hcl Other (See Comments)  ?   Dilaudid Rxed for renal calculi caused angioedema ?  ? Phenytoin Swelling  ? Zocor [Simvastatin - High Dose]   ?  Elevated hepatic enzymes  ? Crestor [Rosuvastatin Calcium] Other (See Comments)  ?  Leg pain. Not as bad since on a lower dose  ? Statins   ?  Leg pain  ? ? ?Social History  ? ?Socioeconomic History  ? Marital status: Married  ?  Spouse name: Not on file  ? Number of children: 2  ? Years of education: Not on file  ? Highest education level: Bachelor's degree (e.g., BA, AB, BS)  ?Occupational History  ? Not on file  ?Tobacco Use  ? Smoking status: Former  ?  Types: Cigarettes  ?  Quit date: 11/17/1960  ?  Years since quitting: 61.2  ? Smokeless tobacco: Never  ? Tobacco comments:  ?  smoked 1948-1962, up to 1/2 ppd  ?Vaping Use  ? Vaping Use: Never used  ?Substance and Sexual Activity  ? Alcohol use: Yes  ?  Alcohol/week: 10.0 standard drinks  ?  Types: 10 Glasses of wine per week  ?  Comment: wine @ dinner  ? Drug use: No  ? Sexual activity: Not on file  ?Other Topics Concern  ? Not on file  ?Social History Narrative  ? Not on file   ? ?Social Determinants of Health  ? ?Financial Resource Strain: Not on file  ?Food Insecurity: Not on file  ?Transportation Needs: Not on file  ?Physical Activity: Not on file  ?Stress: Not on file  ?Social Connections: Not on file  ?Intimate Partner Violence: Not on file  ?  ? ?Family History  ?Problem Relation Age of Onset  ? Hypertension Mother   ? Stroke Mother   ?     in 85s  ? Heart attack Mother   ?     41  ? Coronary artery disease Father   ?     32s  ? Heart disease Paternal Grandmother   ?     in 72s  ? Healthy Child   ? Asthma Brother   ? Diabetes Brother   ? Alcohol abuse Brother   ? Prostate cancer Maternal Grandfather   ?  ? ?Review of Systems: ?All other systems reviewed and are otherwise negative except as noted above. ? ?Physical Exam: ?Vitals:  ? 02/19/22 1240 02/19/22 1245 02/19/22 1300 02/19/22 1315  ?BP: (!) 153/51 (!) 160/88 (!) 143/48 (!) 115/49  ?Pulse: (!) 39 (!) 36 (!) 45 100  ?Resp: '14 14 15 12  '$ ?Temp: 97.6 ?F (36.4 ?C)     ?TempSrc: Oral     ?SpO2: 99% 100% 100% 99%  ? ? ?GEN- The patient is well appearing, alert and oriented x 3 today.   ?HEENT: normocephalic, atraumatic; sclera clear, conjunctiva pink; hearing intact; oropharynx clear; neck supple ?Lungs- Clear to ausculation bilaterally, normal work of breathing.  No wheezes, rales, rhonchi ?Heart- Regular rate and rhythm, no murmurs, rubs or gallops ?GI- soft, non-tender, non-distended, bowel sounds present ?Extremities- no clubbing, cyanosis, or edema; DP/PT/radial pulses 2+ bilaterally ?MS- no significant deformity or atrophy ?Skin- warm and dry, no rash or lesion ?Psych- euthymic mood, full affect ?Neuro- strength and sensation are intact ? ?Labs: ?  ?Lab Results  ?Component Value Date  ? WBC 9.6 02/19/2022  ? HGB 12.3 (L) 02/19/2022  ? HCT 38.2 (L) 02/19/2022  ? MCV 103.0 (H) 02/19/2022  ? PLT 183 02/19/2022  ? No results for input(s): NA, K, CL, CO2, BUN, CREATININE, CALCIUM, PROT, BILITOT, ALKPHOS, ALT, AST, GLUCOSE in the last  168 hours. ? ?Invalid input(s): LABALBU ? ?  ?Radiology/Studies: DG Chest Portable 1 View ? ?Result Date: 02/19/2022 ?CLINICAL DATA:  Evaluate for infection EXAM: PORTABLE CHEST 1 VIEW COMPARISON:  01/07/2022 FINDINGS: Evaluation is somewhat limited by the patient's head obscuring the right-greater-than-left lung apices. Cardiac and mediastinal contours are within normal limits given positioning. No  focal pulmonary opacity. No pleural effusion or pneumothorax. No acute osseous abnormality. IMPRESSION: Evaluation is somewhat limited as the patient's head obscures the lung apices. Within this limitation, no acute cardiopulmonary process. Electronically Signed   By: Merilyn Baba M.D.   On: 02/19/2022 12:52   ? ?EKG: EKG initially felt to be CHB, but on second review appears to be Mobitz 1 bradycardia in 30s (personally reviewed) ? ?TELEMETRY: HRs overall in 30-40s with periods of 2:1 AV block, but also with periods of "grouped beating" more consistent with Mobitz 1 (personally reviewed) ? ?Assessment/Plan: ?1.  Second degree heart block, symptomatic ?Extensive discussion with Daughter who is POA on phone.  ?She understands that pacemaker is unlikely to change his prognostic course given gradual decline over the past several months or more.  ?It is unclear if his rhythm would indicate pacing, but given his severe Alzheimers/Dementia/and Parkinsons, the likelihood of complication from pacing is higher than the likelihood of any benefit, especially any prolonged benefit.  ? ?The family wishes for resources to transition his care to that of a palliative nature, and wish to avoid any invasive procedures.  ? ?Dr. Lovena Le to see.  ? ?For questions or updates, please contact [facility] ?Please consult [email or web address] for contact info under Cardiology/STEMI. ? ?Signed, ?[name] Friar, PA-C  ?02/19/2022 ?2:02 PM ? ?EP attending ? ?Patient seen and examined.  Agree with the findings as noted above.  The patient is a  pleasantly but severely demented 88 year old man who was brought into the hospital today after being found to be bradycardic by his home care nurse.  He was found to have 2-1 as well as AV [employer] block.

## 2022-02-20 DIAGNOSIS — Z7189 Other specified counseling: Secondary | ICD-10-CM | POA: Insufficient documentation

## 2022-02-20 DIAGNOSIS — Z711 Person with feared health complaint in whom no diagnosis is made: Secondary | ICD-10-CM | POA: Insufficient documentation

## 2022-02-20 DIAGNOSIS — I10 Essential (primary) hypertension: Secondary | ICD-10-CM | POA: Diagnosis not present

## 2022-02-20 DIAGNOSIS — R531 Weakness: Secondary | ICD-10-CM | POA: Insufficient documentation

## 2022-02-20 DIAGNOSIS — J302 Other seasonal allergic rhinitis: Secondary | ICD-10-CM | POA: Diagnosis not present

## 2022-02-20 DIAGNOSIS — Z66 Do not resuscitate: Secondary | ICD-10-CM | POA: Insufficient documentation

## 2022-02-20 DIAGNOSIS — Z681 Body mass index (BMI) 19 or less, adult: Secondary | ICD-10-CM | POA: Diagnosis not present

## 2022-02-20 DIAGNOSIS — R638 Other symptoms and signs concerning food and fluid intake: Secondary | ICD-10-CM | POA: Insufficient documentation

## 2022-02-20 DIAGNOSIS — I2510 Atherosclerotic heart disease of native coronary artery without angina pectoris: Secondary | ICD-10-CM | POA: Diagnosis not present

## 2022-02-20 DIAGNOSIS — G20 Parkinson's disease: Secondary | ICD-10-CM | POA: Diagnosis not present

## 2022-02-20 DIAGNOSIS — F0280 Dementia in other diseases classified elsewhere without behavioral disturbance: Secondary | ICD-10-CM | POA: Diagnosis not present

## 2022-02-20 DIAGNOSIS — R001 Bradycardia, unspecified: Secondary | ICD-10-CM | POA: Insufficient documentation

## 2022-02-20 DIAGNOSIS — Z515 Encounter for palliative care: Secondary | ICD-10-CM | POA: Insufficient documentation

## 2022-02-20 DIAGNOSIS — H3530 Unspecified macular degeneration: Secondary | ICD-10-CM | POA: Diagnosis not present

## 2022-02-20 DIAGNOSIS — E785 Hyperlipidemia, unspecified: Secondary | ICD-10-CM | POA: Diagnosis not present

## 2022-02-20 DIAGNOSIS — K219 Gastro-esophageal reflux disease without esophagitis: Secondary | ICD-10-CM | POA: Diagnosis not present

## 2022-02-20 DIAGNOSIS — Z789 Other specified health status: Secondary | ICD-10-CM | POA: Insufficient documentation

## 2022-02-21 DIAGNOSIS — F0280 Dementia in other diseases classified elsewhere without behavioral disturbance: Secondary | ICD-10-CM | POA: Diagnosis not present

## 2022-02-21 DIAGNOSIS — I10 Essential (primary) hypertension: Secondary | ICD-10-CM | POA: Diagnosis not present

## 2022-02-21 DIAGNOSIS — E785 Hyperlipidemia, unspecified: Secondary | ICD-10-CM | POA: Diagnosis not present

## 2022-02-21 DIAGNOSIS — I2510 Atherosclerotic heart disease of native coronary artery without angina pectoris: Secondary | ICD-10-CM | POA: Diagnosis not present

## 2022-02-21 DIAGNOSIS — R001 Bradycardia, unspecified: Secondary | ICD-10-CM | POA: Diagnosis not present

## 2022-02-21 DIAGNOSIS — G20 Parkinson's disease: Secondary | ICD-10-CM | POA: Diagnosis not present

## 2022-02-24 DIAGNOSIS — G20 Parkinson's disease: Secondary | ICD-10-CM | POA: Diagnosis not present

## 2022-02-24 DIAGNOSIS — I2510 Atherosclerotic heart disease of native coronary artery without angina pectoris: Secondary | ICD-10-CM | POA: Diagnosis not present

## 2022-02-24 DIAGNOSIS — F0280 Dementia in other diseases classified elsewhere without behavioral disturbance: Secondary | ICD-10-CM | POA: Diagnosis not present

## 2022-02-24 DIAGNOSIS — R001 Bradycardia, unspecified: Secondary | ICD-10-CM | POA: Diagnosis not present

## 2022-02-24 DIAGNOSIS — E785 Hyperlipidemia, unspecified: Secondary | ICD-10-CM | POA: Diagnosis not present

## 2022-02-24 DIAGNOSIS — I10 Essential (primary) hypertension: Secondary | ICD-10-CM | POA: Diagnosis not present

## 2022-02-25 DIAGNOSIS — E785 Hyperlipidemia, unspecified: Secondary | ICD-10-CM | POA: Diagnosis not present

## 2022-02-25 DIAGNOSIS — I10 Essential (primary) hypertension: Secondary | ICD-10-CM | POA: Diagnosis not present

## 2022-02-25 DIAGNOSIS — I2510 Atherosclerotic heart disease of native coronary artery without angina pectoris: Secondary | ICD-10-CM | POA: Diagnosis not present

## 2022-02-25 DIAGNOSIS — R001 Bradycardia, unspecified: Secondary | ICD-10-CM | POA: Diagnosis not present

## 2022-02-25 DIAGNOSIS — G20 Parkinson's disease: Secondary | ICD-10-CM | POA: Diagnosis not present

## 2022-02-25 DIAGNOSIS — F0280 Dementia in other diseases classified elsewhere without behavioral disturbance: Secondary | ICD-10-CM | POA: Diagnosis not present

## 2022-02-27 DIAGNOSIS — R001 Bradycardia, unspecified: Secondary | ICD-10-CM | POA: Diagnosis not present

## 2022-02-27 DIAGNOSIS — E785 Hyperlipidemia, unspecified: Secondary | ICD-10-CM | POA: Diagnosis not present

## 2022-02-27 DIAGNOSIS — I10 Essential (primary) hypertension: Secondary | ICD-10-CM | POA: Diagnosis not present

## 2022-02-27 DIAGNOSIS — F0280 Dementia in other diseases classified elsewhere without behavioral disturbance: Secondary | ICD-10-CM | POA: Diagnosis not present

## 2022-02-27 DIAGNOSIS — G20 Parkinson's disease: Secondary | ICD-10-CM | POA: Diagnosis not present

## 2022-02-27 DIAGNOSIS — I2510 Atherosclerotic heart disease of native coronary artery without angina pectoris: Secondary | ICD-10-CM | POA: Diagnosis not present

## 2022-03-03 DIAGNOSIS — F02B0 Dementia in other diseases classified elsewhere, moderate, without behavioral disturbance, psychotic disturbance, mood disturbance, and anxiety: Secondary | ICD-10-CM | POA: Diagnosis not present

## 2022-03-03 DIAGNOSIS — G20 Parkinson's disease: Secondary | ICD-10-CM | POA: Diagnosis not present

## 2022-03-03 DIAGNOSIS — G301 Alzheimer's disease with late onset: Secondary | ICD-10-CM | POA: Diagnosis not present

## 2022-03-03 DIAGNOSIS — I10 Essential (primary) hypertension: Secondary | ICD-10-CM | POA: Diagnosis not present

## 2022-03-03 DIAGNOSIS — R001 Bradycardia, unspecified: Secondary | ICD-10-CM | POA: Diagnosis not present

## 2022-03-03 DIAGNOSIS — K219 Gastro-esophageal reflux disease without esophagitis: Secondary | ICD-10-CM | POA: Diagnosis not present

## 2022-03-04 DIAGNOSIS — E785 Hyperlipidemia, unspecified: Secondary | ICD-10-CM | POA: Diagnosis not present

## 2022-03-04 DIAGNOSIS — I10 Essential (primary) hypertension: Secondary | ICD-10-CM | POA: Diagnosis not present

## 2022-03-04 DIAGNOSIS — G20 Parkinson's disease: Secondary | ICD-10-CM | POA: Diagnosis not present

## 2022-03-04 DIAGNOSIS — I2510 Atherosclerotic heart disease of native coronary artery without angina pectoris: Secondary | ICD-10-CM | POA: Diagnosis not present

## 2022-03-04 DIAGNOSIS — R001 Bradycardia, unspecified: Secondary | ICD-10-CM | POA: Diagnosis not present

## 2022-03-04 DIAGNOSIS — F0280 Dementia in other diseases classified elsewhere without behavioral disturbance: Secondary | ICD-10-CM | POA: Diagnosis not present

## 2022-03-05 DIAGNOSIS — I2510 Atherosclerotic heart disease of native coronary artery without angina pectoris: Secondary | ICD-10-CM | POA: Diagnosis not present

## 2022-03-05 DIAGNOSIS — I10 Essential (primary) hypertension: Secondary | ICD-10-CM | POA: Diagnosis not present

## 2022-03-05 DIAGNOSIS — F0280 Dementia in other diseases classified elsewhere without behavioral disturbance: Secondary | ICD-10-CM | POA: Diagnosis not present

## 2022-03-05 DIAGNOSIS — E785 Hyperlipidemia, unspecified: Secondary | ICD-10-CM | POA: Diagnosis not present

## 2022-03-05 DIAGNOSIS — R001 Bradycardia, unspecified: Secondary | ICD-10-CM | POA: Diagnosis not present

## 2022-03-05 DIAGNOSIS — G20 Parkinson's disease: Secondary | ICD-10-CM | POA: Diagnosis not present

## 2022-03-06 DIAGNOSIS — I10 Essential (primary) hypertension: Secondary | ICD-10-CM | POA: Diagnosis not present

## 2022-03-06 DIAGNOSIS — R001 Bradycardia, unspecified: Secondary | ICD-10-CM | POA: Diagnosis not present

## 2022-03-06 DIAGNOSIS — I2510 Atherosclerotic heart disease of native coronary artery without angina pectoris: Secondary | ICD-10-CM | POA: Diagnosis not present

## 2022-03-06 DIAGNOSIS — G20 Parkinson's disease: Secondary | ICD-10-CM | POA: Diagnosis not present

## 2022-03-06 DIAGNOSIS — E785 Hyperlipidemia, unspecified: Secondary | ICD-10-CM | POA: Diagnosis not present

## 2022-03-06 DIAGNOSIS — F0280 Dementia in other diseases classified elsewhere without behavioral disturbance: Secondary | ICD-10-CM | POA: Diagnosis not present

## 2022-03-10 DIAGNOSIS — G20 Parkinson's disease: Secondary | ICD-10-CM | POA: Diagnosis not present

## 2022-03-10 DIAGNOSIS — G301 Alzheimer's disease with late onset: Secondary | ICD-10-CM | POA: Diagnosis not present

## 2022-03-10 DIAGNOSIS — L89891 Pressure ulcer of other site, stage 1: Secondary | ICD-10-CM | POA: Diagnosis not present

## 2022-03-10 DIAGNOSIS — R001 Bradycardia, unspecified: Secondary | ICD-10-CM | POA: Diagnosis not present

## 2022-03-10 DIAGNOSIS — F02B0 Dementia in other diseases classified elsewhere, moderate, without behavioral disturbance, psychotic disturbance, mood disturbance, and anxiety: Secondary | ICD-10-CM | POA: Diagnosis not present

## 2022-03-10 DIAGNOSIS — L89892 Pressure ulcer of other site, stage 2: Secondary | ICD-10-CM | POA: Diagnosis not present

## 2022-03-11 DIAGNOSIS — I2510 Atherosclerotic heart disease of native coronary artery without angina pectoris: Secondary | ICD-10-CM | POA: Diagnosis not present

## 2022-03-11 DIAGNOSIS — I10 Essential (primary) hypertension: Secondary | ICD-10-CM | POA: Diagnosis not present

## 2022-03-11 DIAGNOSIS — E785 Hyperlipidemia, unspecified: Secondary | ICD-10-CM | POA: Diagnosis not present

## 2022-03-11 DIAGNOSIS — R001 Bradycardia, unspecified: Secondary | ICD-10-CM | POA: Diagnosis not present

## 2022-03-11 DIAGNOSIS — G20 Parkinson's disease: Secondary | ICD-10-CM | POA: Diagnosis not present

## 2022-03-11 DIAGNOSIS — F0280 Dementia in other diseases classified elsewhere without behavioral disturbance: Secondary | ICD-10-CM | POA: Diagnosis not present

## 2022-03-12 DIAGNOSIS — E785 Hyperlipidemia, unspecified: Secondary | ICD-10-CM | POA: Diagnosis not present

## 2022-03-12 DIAGNOSIS — R001 Bradycardia, unspecified: Secondary | ICD-10-CM | POA: Diagnosis not present

## 2022-03-12 DIAGNOSIS — F0280 Dementia in other diseases classified elsewhere without behavioral disturbance: Secondary | ICD-10-CM | POA: Diagnosis not present

## 2022-03-12 DIAGNOSIS — I2510 Atherosclerotic heart disease of native coronary artery without angina pectoris: Secondary | ICD-10-CM | POA: Diagnosis not present

## 2022-03-12 DIAGNOSIS — I10 Essential (primary) hypertension: Secondary | ICD-10-CM | POA: Diagnosis not present

## 2022-03-12 DIAGNOSIS — G20 Parkinson's disease: Secondary | ICD-10-CM | POA: Diagnosis not present

## 2022-03-13 DIAGNOSIS — G20 Parkinson's disease: Secondary | ICD-10-CM | POA: Diagnosis not present

## 2022-03-13 DIAGNOSIS — I10 Essential (primary) hypertension: Secondary | ICD-10-CM | POA: Diagnosis not present

## 2022-03-13 DIAGNOSIS — I2510 Atherosclerotic heart disease of native coronary artery without angina pectoris: Secondary | ICD-10-CM | POA: Diagnosis not present

## 2022-03-13 DIAGNOSIS — R001 Bradycardia, unspecified: Secondary | ICD-10-CM | POA: Diagnosis not present

## 2022-03-13 DIAGNOSIS — E785 Hyperlipidemia, unspecified: Secondary | ICD-10-CM | POA: Diagnosis not present

## 2022-03-13 DIAGNOSIS — F0280 Dementia in other diseases classified elsewhere without behavioral disturbance: Secondary | ICD-10-CM | POA: Diagnosis not present

## 2022-03-14 DIAGNOSIS — I2510 Atherosclerotic heart disease of native coronary artery without angina pectoris: Secondary | ICD-10-CM | POA: Diagnosis not present

## 2022-03-14 DIAGNOSIS — F0280 Dementia in other diseases classified elsewhere without behavioral disturbance: Secondary | ICD-10-CM | POA: Diagnosis not present

## 2022-03-14 DIAGNOSIS — E785 Hyperlipidemia, unspecified: Secondary | ICD-10-CM | POA: Diagnosis not present

## 2022-03-14 DIAGNOSIS — R001 Bradycardia, unspecified: Secondary | ICD-10-CM | POA: Diagnosis not present

## 2022-03-14 DIAGNOSIS — I10 Essential (primary) hypertension: Secondary | ICD-10-CM | POA: Diagnosis not present

## 2022-03-14 DIAGNOSIS — G20 Parkinson's disease: Secondary | ICD-10-CM | POA: Diagnosis not present

## 2022-03-15 DIAGNOSIS — R001 Bradycardia, unspecified: Secondary | ICD-10-CM | POA: Diagnosis not present

## 2022-03-15 DIAGNOSIS — I10 Essential (primary) hypertension: Secondary | ICD-10-CM | POA: Diagnosis not present

## 2022-03-15 DIAGNOSIS — I2510 Atherosclerotic heart disease of native coronary artery without angina pectoris: Secondary | ICD-10-CM | POA: Diagnosis not present

## 2022-03-15 DIAGNOSIS — E785 Hyperlipidemia, unspecified: Secondary | ICD-10-CM | POA: Diagnosis not present

## 2022-03-15 DIAGNOSIS — F0280 Dementia in other diseases classified elsewhere without behavioral disturbance: Secondary | ICD-10-CM | POA: Diagnosis not present

## 2022-03-15 DIAGNOSIS — G20 Parkinson's disease: Secondary | ICD-10-CM | POA: Diagnosis not present

## 2022-03-16 DIAGNOSIS — I10 Essential (primary) hypertension: Secondary | ICD-10-CM | POA: Diagnosis not present

## 2022-03-16 DIAGNOSIS — G20 Parkinson's disease: Secondary | ICD-10-CM | POA: Diagnosis not present

## 2022-03-16 DIAGNOSIS — I2510 Atherosclerotic heart disease of native coronary artery without angina pectoris: Secondary | ICD-10-CM | POA: Diagnosis not present

## 2022-03-16 DIAGNOSIS — R001 Bradycardia, unspecified: Secondary | ICD-10-CM | POA: Diagnosis not present

## 2022-03-16 DIAGNOSIS — F0280 Dementia in other diseases classified elsewhere without behavioral disturbance: Secondary | ICD-10-CM | POA: Diagnosis not present

## 2022-03-16 DIAGNOSIS — E785 Hyperlipidemia, unspecified: Secondary | ICD-10-CM | POA: Diagnosis not present

## 2022-03-17 DIAGNOSIS — G301 Alzheimer's disease with late onset: Secondary | ICD-10-CM | POA: Diagnosis not present

## 2022-03-17 DIAGNOSIS — I10 Essential (primary) hypertension: Secondary | ICD-10-CM | POA: Diagnosis not present

## 2022-03-17 DIAGNOSIS — K219 Gastro-esophageal reflux disease without esophagitis: Secondary | ICD-10-CM | POA: Diagnosis not present

## 2022-03-17 DIAGNOSIS — F0280 Dementia in other diseases classified elsewhere without behavioral disturbance: Secondary | ICD-10-CM | POA: Diagnosis not present

## 2022-03-17 DIAGNOSIS — E785 Hyperlipidemia, unspecified: Secondary | ICD-10-CM | POA: Diagnosis not present

## 2022-03-17 DIAGNOSIS — J302 Other seasonal allergic rhinitis: Secondary | ICD-10-CM | POA: Diagnosis not present

## 2022-03-17 DIAGNOSIS — I2510 Atherosclerotic heart disease of native coronary artery without angina pectoris: Secondary | ICD-10-CM | POA: Diagnosis not present

## 2022-03-17 DIAGNOSIS — F02B0 Dementia in other diseases classified elsewhere, moderate, without behavioral disturbance, psychotic disturbance, mood disturbance, and anxiety: Secondary | ICD-10-CM | POA: Diagnosis not present

## 2022-03-17 DIAGNOSIS — H3530 Unspecified macular degeneration: Secondary | ICD-10-CM | POA: Diagnosis not present

## 2022-03-17 DIAGNOSIS — Z681 Body mass index (BMI) 19 or less, adult: Secondary | ICD-10-CM | POA: Diagnosis not present

## 2022-03-17 DIAGNOSIS — G20 Parkinson's disease: Secondary | ICD-10-CM | POA: Diagnosis not present

## 2022-03-17 DIAGNOSIS — R001 Bradycardia, unspecified: Secondary | ICD-10-CM | POA: Diagnosis not present

## 2022-03-18 DIAGNOSIS — G20 Parkinson's disease: Secondary | ICD-10-CM | POA: Diagnosis not present

## 2022-03-18 DIAGNOSIS — E785 Hyperlipidemia, unspecified: Secondary | ICD-10-CM | POA: Diagnosis not present

## 2022-03-18 DIAGNOSIS — F0280 Dementia in other diseases classified elsewhere without behavioral disturbance: Secondary | ICD-10-CM | POA: Diagnosis not present

## 2022-03-18 DIAGNOSIS — I2510 Atherosclerotic heart disease of native coronary artery without angina pectoris: Secondary | ICD-10-CM | POA: Diagnosis not present

## 2022-03-18 DIAGNOSIS — I10 Essential (primary) hypertension: Secondary | ICD-10-CM | POA: Diagnosis not present

## 2022-03-18 DIAGNOSIS — R001 Bradycardia, unspecified: Secondary | ICD-10-CM | POA: Diagnosis not present

## 2022-03-19 DIAGNOSIS — G20 Parkinson's disease: Secondary | ICD-10-CM | POA: Diagnosis not present

## 2022-03-19 DIAGNOSIS — E785 Hyperlipidemia, unspecified: Secondary | ICD-10-CM | POA: Diagnosis not present

## 2022-03-19 DIAGNOSIS — R001 Bradycardia, unspecified: Secondary | ICD-10-CM | POA: Diagnosis not present

## 2022-03-19 DIAGNOSIS — F0280 Dementia in other diseases classified elsewhere without behavioral disturbance: Secondary | ICD-10-CM | POA: Diagnosis not present

## 2022-03-19 DIAGNOSIS — I2510 Atherosclerotic heart disease of native coronary artery without angina pectoris: Secondary | ICD-10-CM | POA: Diagnosis not present

## 2022-03-19 DIAGNOSIS — I10 Essential (primary) hypertension: Secondary | ICD-10-CM | POA: Diagnosis not present

## 2022-03-20 DIAGNOSIS — E785 Hyperlipidemia, unspecified: Secondary | ICD-10-CM | POA: Diagnosis not present

## 2022-03-20 DIAGNOSIS — I2510 Atherosclerotic heart disease of native coronary artery without angina pectoris: Secondary | ICD-10-CM | POA: Diagnosis not present

## 2022-03-20 DIAGNOSIS — F0280 Dementia in other diseases classified elsewhere without behavioral disturbance: Secondary | ICD-10-CM | POA: Diagnosis not present

## 2022-03-20 DIAGNOSIS — I10 Essential (primary) hypertension: Secondary | ICD-10-CM | POA: Diagnosis not present

## 2022-03-20 DIAGNOSIS — R001 Bradycardia, unspecified: Secondary | ICD-10-CM | POA: Diagnosis not present

## 2022-03-20 DIAGNOSIS — G20 Parkinson's disease: Secondary | ICD-10-CM | POA: Diagnosis not present

## 2022-04-17 DEATH — deceased

## 2022-06-25 NOTE — Progress Notes (Deleted)
Assessment/Plan:   1.  Parkinsons Disease  -Continue carbidopa/levodopa 25/100, 1.5 tablets 3 times per day  -Son has not been to the office before and had many questions today.  Answered those to the best of my ability.   2.  Dementia, mixed PDD and AD, severe  -On donepezil, 10 mg daily  -was given low-dose quetiapine, 12.5 mg at bedtime but they initially thought that it caused wandering at night.  Im not convinced that was the case in the past and he likely was just wandering.  He has better nighttime caregiving now, and is not sleeping well.  Part of this is due to the bladder.  Son would like to retry the quetiapine.  Discussed risk, benefits, side effects, including black box.  They expressed understanding.  They will let me know how he does with this.  We may need to have a higher dosage.  3.  Nocturia  -discussed bedside commode and urinal  -He just saw urology, but they did not discuss this.  Discussed that I saw on urology medicine list that he was on Myrbetriq, but does not bring down the medicine list today.  Asked them to follow-up with them about that.  If he is off of that, perhaps that is why he is having so much nocturia.   Subjective:   [name] [name] was seen today in follow up for Parkinsons disease.  My previous records were reviewed prior to todays visit as well as outside records available to me.  Son is with the patient and supplements the history.  Patient was in the emergency room at the end of February after a fall causing a scalp laceration.  Sutures were required.  Patient has fallen frequently.  Patient was back in the emergency room in April with fairly significant bradycardia down into the low 40s.  Because of significant dementia, EP team declined pacemaker and recommended palliative care.  Current prescribed movement disorder medications:  Donepezil, 10 mg daily  Carbidopa/levodopa 25/100, 1.5 tablets 3 times per day (increased last  visit)   PREVIOUS MEDICATIONS: Sinemet  ALLERGIES:   Allergies  Allergen Reactions   Hydromorphone Hcl Other (See Comments)     Dilaudid Rxed for renal calculi caused angioedema    Phenytoin Swelling   Zocor [Simvastatin - High Dose]     Elevated hepatic enzymes   Crestor [Rosuvastatin Calcium] Other (See Comments)    Leg pain. Not as bad since on a lower dose   Statins     Leg pain    CURRENT MEDICATIONS:  Outpatient Encounter Medications as of 07/08/2022  Medication Sig   acetaminophen (TYLENOL) 500 MG tablet Take 1,000 mg by mouth 3 (three) times daily as needed (pain).   carbidopa-levodopa (SINEMET IR) 25-100 MG tablet TAKE 1 AND 1/2 TABLETS BY MOUTH THREE TIMES DAILY (Patient taking differently: Take 1.5 tablets by mouth 3 (three) times daily.)   clobetasol cream (TEMOVATE) 3.71 % Apply 1 application. topically 2 (two) times daily as needed (redness, itching, dryness, or swelling).   donepezil (ARICEPT) 10 MG tablet TAKE 1 TABLET(10 MG) BY MOUTH AT BEDTIME (Patient not taking: Reported on 02/19/2022)   Ensure (ENSURE) Take 237 mLs by mouth 2 (two) times daily as needed (appetite).   fluticasone (FLONASE) 50 MCG/ACT nasal spray Place 2 sprays into both nostrils daily. --- Office visit needed for further refills (Patient taking differently: Place 2 sprays into both nostrils daily as needed for rhinitis or allergies.)   guaiFENesin (ROBITUSSIN)  100 MG/5ML liquid Take 200 mg by mouth every 6 (six) hours as needed for cough (chest congestion).   melatonin 3 MG TABS tablet Take 3 mg by mouth at bedtime.   neomycin-bacitracin-polymyxin (NEOSPORIN) [phone] ointment Apply 1 application. topically at bedtime. Apply to skin tear   nitroGLYCERIN (NITROSTAT) 0.4 MG SL tablet Place 1 tablet (0.4 mg total) under the tongue every 5 (five) minutes as needed for chest pain. (Patient taking differently: Place 0.4 mg under the tongue every 5 (five) minutes x 3 doses as needed for chest pain.)    pantoprazole (PROTONIX) 40 MG tablet Take 40 mg by mouth daily.   polyethylene glycol (MIRALAX / GLYCOLAX) 17 g packet Take 17 g by mouth daily as needed for mild constipation.   Propylene Glycol (LUBRICANT EYE DROPS) 0.6 % SOLN Place 2 drops into both eyes 4 (four) times daily as needed (dry eyes).   QUEtiapine (SEROQUEL) 25 MG tablet Take 0.5 tablets (12.5 mg total) by mouth at bedtime. (Patient not taking: Reported on 02/19/2022)   No facility-administered encounter medications on file as of 07/08/2022.    Objective:   PHYSICAL EXAMINATION:    VITALS:   There were no vitals filed for this visit.    GEN:  The patient appears stated age and is in NAD. HEENT:  Normocephalic, atraumatic.  The mucous membranes are moist. The superficial temporal arteries are without ropiness or tenderness. CV:  RRR Lungs:  CTAB Neck/HEME:  There are no carotid bruits bilaterally.  Neurological examination:  Orientation: The patient is alert and oriented to person only.  He has trouble staying with the conversation. Cranial nerves: There is good facial symmetry with facial hypomimia. The speech is fluent and clear. Soft palate rises symmetrically and there is no tongue deviation. Hearing is intact to conversational tone. Sensation: Sensation is intact to light touch throughout Motor: Strength is at least antigravity x4.  Movement examination: Tone: There is mild to mod tone increased tone in the bilateral UE Abnormal movements: there is rare tremor in the UE Coordination:  There is slowness of rapid alternating movements, but much of this is due to apraxia. Gait and Station: The patient pushes off of the chair to arise.  The patient's stride length is decreased and he is shuffling and is not well-balanced.  He definitely has trouble in the turn.    I have reviewed and interpreted the following labs independently    Chemistry      Component Value Date/Time   NA 141 02/19/2022 1434   K 4.6  02/19/2022 1434   CL 110 02/19/2022 1434   CO2 26 02/19/2022 1434   BUN 26 (H) 02/19/2022 1434   CREATININE 1.29 (H) 02/19/2022 1434   CREATININE 1.01 01/27/2019 1405      Component Value Date/Time   CALCIUM 8.6 (L) 02/19/2022 1434   ALKPHOS 109 10/31/2020 1231   AST 16 10/31/2020 1231   ALT 22 10/31/2020 1231   BILITOT 1.0 10/31/2020 1231       Lab Results  Component Value Date   WBC 9.6 02/19/2022   HGB 12.3 (L) 02/19/2022   HCT 38.2 (L) 02/19/2022   MCV 103.0 (H) 02/19/2022   PLT 183 02/19/2022    Lab Results  Component Value Date   TSH 2.74 01/27/2019     Total time spent on today's visit was *** minutes, including both face-to-face time and nonface-to-face time.  Time included that spent on review of records (prior notes available to me/labs/imaging if  pertinent), discussing treatment and goals, answering patient's questions and coordinating care.  Cc:  Wenda Low, MD

## 2022-07-08 ENCOUNTER — Encounter: Payer: Self-pay | Admitting: Neurology

## 2022-07-08 ENCOUNTER — Ambulatory Visit: Payer: Medicare Other | Admitting: Neurology

## 2022-07-08 DIAGNOSIS — Z029 Encounter for administrative examinations, unspecified: Secondary | ICD-10-CM

## 2022-07-11 ENCOUNTER — Telehealth: Payer: Self-pay | Admitting: Neurology

## 2022-07-11 NOTE — Telephone Encounter (Signed)
Patient dismissed from [facility] by Alonza Bogus, DO, effective 07/08/22 Dismissal Letter sent out by 1st class mail. [employer]
